# Patient Record
Sex: Male | Born: 1945 | Race: White | Hispanic: No | Marital: Married
Health system: Midwestern US, Community
[De-identification: ages and names within clinical notes are randomized; demographics above are authoritative.]

## PROBLEM LIST (undated history)

## (undated) DIAGNOSIS — E119 Type 2 diabetes mellitus without complications: Secondary | ICD-10-CM

## (undated) DIAGNOSIS — I10 Essential (primary) hypertension: Secondary | ICD-10-CM

## (undated) DIAGNOSIS — I219 Acute myocardial infarction, unspecified: Secondary | ICD-10-CM

## (undated) DIAGNOSIS — I509 Heart failure, unspecified: Secondary | ICD-10-CM

## (undated) HISTORY — PX: AORTIC VALVE REPLACEMENT (AVR)/CORONARY ARTERY BYPASS GRAFTING (CABG): SHX5725

## (undated) HISTORY — PX: VALVE REPLACEMENT: SUR13

## (undated) HISTORY — PX: STENT PLACEMENT VASCULAR (ARMC HX): HXRAD1737

---

## 2016-11-01 LAB — PSA, DIAGNOSTIC (PROSTATE SPECIFIC AG): Prostate Specific Ag: 4.789 ng/mL — AB (ref 0.00–4.00)

## 2017-01-03 ENCOUNTER — Encounter: Attending: Urology | Primary: Internal Medicine

## 2017-01-24 ENCOUNTER — Inpatient Hospital Stay: Admit: 2017-01-24 | Payer: BLUE CROSS/BLUE SHIELD | Primary: Internal Medicine

## 2017-01-24 DIAGNOSIS — I10 Essential (primary) hypertension: Secondary | ICD-10-CM

## 2017-01-24 LAB — METABOLIC PANEL, BASIC
Anion gap: 10 mmol/L (ref 3.0–18)
BUN/Creatinine ratio: 18 (ref 12–20)
BUN: 19 MG/DL — ABNORMAL HIGH (ref 7.0–18)
CO2: 31 mmol/L (ref 21–32)
Calcium: 8.9 MG/DL (ref 8.5–10.1)
Chloride: 102 mmol/L (ref 100–108)
Creatinine: 1.03 MG/DL (ref 0.6–1.3)
GFR est AA: >60 mL/min/{1.73_m2} (ref >60)
GFR est non-AA: >60 mL/min/{1.73_m2} (ref >60)
Glucose: 188 mg/dL — ABNORMAL HIGH (ref 74–99)
Potassium: 3.7 mmol/L (ref 3.5–5.5)
Sodium: 143 mmol/L (ref 136–145)

## 2017-01-24 LAB — HEMOGLOBIN A1C WITH EAG
Est. average glucose: 134 mg/dL
Hemoglobin A1c: 6.3 % — ABNORMAL HIGH (ref 4.2–5.6)

## 2017-02-14 ENCOUNTER — Ambulatory Visit
Admit: 2017-02-14 | Discharge: 2017-02-14 | Payer: PRIVATE HEALTH INSURANCE | Attending: Urology | Primary: Internal Medicine

## 2017-02-14 DIAGNOSIS — R972 Elevated prostate specific antigen [PSA]: Secondary | ICD-10-CM

## 2017-02-14 LAB — AMB POC URINALYSIS DIP STICK AUTO W/O MICRO
Bilirubin (UA POC): NEGATIVE
Blood (UA POC): NEGATIVE
Leukocyte esterase (UA POC): NEGATIVE
Nitrites (UA POC): NEGATIVE
Specific gravity (UA POC): 1.02 (ref 1.001–1.035)
Urobilinogen (UA POC): 0.2 (ref 0.2–1)
pH (UA POC): 6.5 (ref 4.6–8.0)

## 2017-02-14 NOTE — Progress Notes (Signed)
Truman Haywardichard Buccellato presents today for lab draw per Dr. Tilman NeatWalker's order.    Patient will be notified with lab results.    Free and total PSA obtained via venipuncture without any difficulty.      Orders Placed This Encounter   ??? PSA (TOTAL, REFLEX TO FREE)   ??? AMB POC URINALYSIS DIP STICK AUTO W/O MICRO   ??? COLLECTION VENOUS BLOOD,VENIPUNCTURE   ??? amLODIPine (NORVASC) 5 mg tablet   ??? atorvastatin (LIPITOR) 40 mg tablet   ??? metFORMIN (GLUCOPHAGE) 1,000 mg tablet   ??? furosemide (LASIX) 40 mg tablet   ??? losartan-hydroCHLOROthiazide (HYZAAR) 100-12.5 mg per tablet   ??? bisoprolol (ZEBETA) 10 mg tablet   ??? JANUVIA 100 mg tablet   ??? aspirin delayed-release 81 mg tablet     Sig: Take  by mouth daily.   ??? insulin glargine (LANTUS SOLOSTAR U-100 INSULIN) 100 unit/mL (3 mL) inpn     Sig: by SubCUTAneous route.       Haniyyah Sakuma [employer]

## 2017-02-14 NOTE — Progress Notes (Signed)
Encounter Diagnoses     ICD-10-CM ICD-9-CM   1. Elevated PSA R97.20 790.93       ASSESSMENT:     1. Elevated PSA   Most recent PSA 11/01/2016 - 4.789ng/mL   DRE today 02/14/2017 - Prostate 40 grams   No FMHx of prostate cancer     PLAN:    ?? Discussed etiology of elevated PSA in regards to patient's age and prostate cancer.   ?? Repeat PSA free and total, draw today - will inform  ?? Discussed treatment of prostate cancer should his PSA continue to rise  ?? Will follow up pending PSA results    Follow-up Disposition: Not on File    DISCUSSION:    We discussed the use of PSA as a screening test for prostate cancer and the controversy surrounding it. We also discussed PSA age adjusted reference ranges.   PSA Age adjusted reference ranges:  For men aged 62-49, the normal reference range is 0-2.5 ng/mL  For men aged 65-59, the normal reference range is 0-3.5 ng/mL  For men aged 67-69, the normal reference range is 0-4.5 ng/mL  For men aged 63-79, the normal reference range is 0-6.5 ng/mL      Reviewed with the patient was the possible significance of an elevated PSA.  Topics covered, but not limited to, included the variability of the PSA assay as it relates to prostatic size, prostatitis, or laboratory fluctuations.  The possibility that an elevated PSA may be indicative of developing clinically significant prostate cancer was reviewed.  It was emphasized that the PSA alone neither confirms nor refutes the presence or absence of prostatic malignancy.    A brief overview of prostate cancer and therapies for same was reviewed.  Emphasis was placed on early detection of prostate cancer may afford an individual the greatest opportunity to be cured of the prostate cancer.  It was noted that the ability to cure prostate cancer is enhanced by early detection. Curative therapy by surgery such as total prostatectomy was briefly reviewed.Other potentially curative therapies were noted to  include radiation therapy delivered through external beam therapy or transperineal brachytherapy as well as cryotherapy That any of the modalities for curative treatment of prostate cancer may entail significant complications or side effects was noted.  Surgical complications including, but not limited to, cardiovascular events such as MI or pulmonary emboli or bleeding requiring transfusion, wound infection, or reaction to medications or anesthetic drugs was noted.  The possibility that the person could have impaired sexual function or incontinence was noted to be a factor, particularly in regard to surgery, but that it was also possible with radiation therapy.  Thus, it was suggested to the patient that should they wish to know of the development of prostate cancer and would wish to review possible treatment options that they take into account not only the enhanced probability of cure through early detection, but that they be aware of and balance this with the possibility of complications, side effects, or long term alteration of lifestyle that such therapies might entail.        Palliative treatment of prostate malignancy, based on hormonal manipulation, was briefly reviewed and noted to be appropriate in some cases through either medication therapy such as Depo Lupron or Zoladex or through minor surgical treatment such as bilateral orchidectomy.        Consideration as to the patient's age and overall health status was noted to be a factor in a person's deliberation as to  the assessment of the significance of an elevated PSA.  It was noted that a patient with profound medical incapacities such that one's expected longevity might not approach 10 years, should be considered as a factor in the decision as to evaluation and/or treatment of prostate cancer.  The concept of "age adjusted" PSA values was likewise covered.      The technique of ultrasound-guided needle biopsy of the prostate gland was  then reviewed. Precautions to minimize complications such as bleeding or infection were noted. The possibility that such bleeding or infection might occur was noted to be between 1-5%. The possibility of sepsis was also reviewed. The technique of the ultrasound examination of the prostate and subsequent ultrasound-guided needle biopsy the prostate gland was reviewed.      Overall, it was recommended to the patient that they consider an elevated PSA level could be indicative of the presence or development of prostate malignancy.  They were encouraged to give consideration toward their desire to be aware of this as it relates to their then intended desire to be treated.  Long term ill effects or sequelae from treatment were encouraged to be considered in one's overall decision to pursue evaluation and ultimately treatment of prostate malignancy.        It was noted that free and total ratio PSA can be of help in determining the significance of a PSA, particularly between the levels of 4 and 10.  It was noted that this blood test is at times helpful in minimizing potentially unnecessary prostatic imaging and biopsies.  This blood test was offered, though it was noted that it might not be covered by the patient's insurance carrier and that the patient would thus be liable for the cost of this exam.         We discussed the various management options for prostate cancer, including active surveillance, open and robotic radical prostatectomy, EBRT, brachytherapy, proton therapy, and cryotherapy.  We discussed the generally indolent course of many prostate cancers, and the generally favorable oncologic control offered by all treatment strategies.  However, I emphasized that all treatments offer only potential for cure and that in many cases multimodal therapy may ultimately be utilized for long term cancer control.  We also discussed the impact of treatment on sexual and  urinary function.  I emphasized that each treatment has unique quality of life impact and recovery profiles, and we reviewed the possible effects of surgery, radiation, and cryotherapy on quality of life outcomes.  All questions were answered.          Chief Complaint   Patient presents with   ??? Elevated PSA       HISTORY OF PRESENT ILLNESS:  Robert Choi is a 71 y.o. male who is seen in consultation as referred by Dr. Coralyn MarkAmir Hajimomenian, MD for Elevated PSA.     Patient is doing well today  Patient has diabetes, managed with insulin - unsure if his parents also had it  No bone or back pain  No sudden loss of weight or appetite   No f/c/n/v    Patient reports his father recently died at 1389, unrelated to prostate cancer    No FHx of prostate cancer  FHx of liver cancer      REVIEW OF LABS AND IMAGING:      PSA Trend  PSA /TESTOSTERONE - [facility] PSA   Latest Ref Rng & Units 0.00 - 4.00 ng/mL   11/01/2016 4.789 (A)  Results for orders placed or performed in visit on 02/14/17   AMB POC URINALYSIS DIP STICK AUTO W/O MICRO   Result Value Ref Range    Color (UA POC) Yellow     Clarity (UA POC) Clear     Glucose (UA POC) 1+ Negative    Bilirubin (UA POC) Negative Negative    Ketones (UA POC) Trace Negative    Specific gravity (UA POC) 1.020 1.001 - 1.035    Blood (UA POC) Negative Negative    pH (UA POC) 6.5 4.6 - 8.0    Protein (UA POC) 3+ Negative    Urobilinogen (UA POC) 0.2 mg/dL 0.2 - 1    Nitrites (UA POC) Negative Negative    Leukocyte esterase (UA POC) Negative Negative       Lab Results   Component Value Date/Time    Prostate Specific Ag 4.789 (A) 11/01/2016       CT Results:  No results found for this or any previous visit.    [place] Results:  No results found for this or any previous visit.      PHYSICAL EXAMINATION:   Visit Vitals   ??? BP 138/74   ??? Ht 5' 8.5" (1.74 m)   ??? Wt 225 lb (102.1 kg)   ??? BMI 33.71 kg/m2     Constitutional: WDWN, Pleasant and appropriate affect, No acute distress.     CV:  No peripheral swelling noted  Respiratory: No respiratory distress or difficulties  Abdomen:  No abdominal masses or tenderness. No CVA tenderness. No inguinal hernias noted.   GU Male:    [id number] normal to visual inspection, no erythema or irritation, Sphincter with good tone, Rectum with no hemorrhoids, fissures or masses, Prostate smooth, symmetric and anodular. Prostate is 40 grams  SCROTUM:  No scrotal rash or lesions noticed.  Normal bilateral testes and epididymis.   PENIS: Urethral meatus normal in location and size. No urethral discharge.  Skin: No jaundice.    Neuro/Psych:  Alert and oriented x 3, affect appropriate.   Lymphatic:   No enlarged inguinal lymph nodes.       AUA Symptom Score 02/14/2017   Over the past month how often have you had the sensation that your bladder was not completely empty after you finished urinating? 1   Over the past month, how often have had to urinate again less than 2 hours after you last finished urinating? 1   Over the past month, how often have you found you stopped and started again several times when you urinated? 1   Over the past month, how often have you found it difficult to postpone urination? 1   Over the past month, how often have you had a weak urinary stream? 1   Over the past month, how often have you had to push or strain to begin urinating? 1   Over the past month, how many times did you most typically get up to urinate from the time you went to bed at night until the time you got up in the morning? 1   AUA Score 7   If you were to spend the rest of your life with your urinary condition the way it is now, how would you feel about that? Mostly satisfied         No past medical history on file.    No past surgical history on file.    Social History   Substance Use Topics   ??? Smoking status: Never  Smoker   ??? Smokeless tobacco: Never Used   ??? Alcohol use No       No Known Allergies    No family history on file.    Current Outpatient Prescriptions    Medication Sig Dispense Refill   ??? amLODIPine (NORVASC) 5 mg tablet      ??? atorvastatin (LIPITOR) 40 mg tablet      ??? metFORMIN (GLUCOPHAGE) 1,000 mg tablet      ??? furosemide (LASIX) 40 mg tablet      ??? losartan-hydroCHLOROthiazide (HYZAAR) 100-12.5 mg per tablet      ??? bisoprolol (ZEBETA) 10 mg tablet      ??? JANUVIA 100 mg tablet      ??? aspirin delayed-release 81 mg tablet Take  by mouth daily.     ??? insulin glargine (LANTUS SOLOSTAR U-100 INSULIN) 100 unit/mL (3 mL) inpn by SubCUTAneous route.             Review of Systems  Constitutional: Fever: No  Skin: Rash: No  HEENT: Hearing difficulty: No  Eyes: Blurred vision: No  Cardiovascular: Chest pain: No  Respiratory: Shortness of breath: No  Gastrointestinal: Nausea/vomiting: No  Musculoskeletal: Back pain: No  Neurological: Weakness: No  Psychological: Memory loss: No  Comments/additional findings:       A copy of today's office visit with all pertinent imaging results and labs were sent to the referring physician.          Medical documentation provided with the assistance of Burnard Bunting, medical scribe to Arminda Resides, MD on 02/14/2017.

## 2017-02-15 LAB — PSA (TOTAL, REFLEX TO FREE): Prostate Specific Ag: 4.73 ng/mL — ABNORMAL HIGH (ref 0.00–4.00)

## 2017-02-15 LAB — PSA (FREE)
PSA, % Free: 24 %
PSA, Free: 1.15 ng/mL

## 2017-02-15 NOTE — Progress Notes (Signed)
PSA is elevated  Rec consider Bx of prostate as previously discussed or RTC to discuss prn    Robert Choi  W  Kealy Lewter, MD

## 2017-02-16 NOTE — Telephone Encounter (Signed)
I called and spoke with Mr. Robert Choi.  I informed him of Dr. Tilman NeatWalker's recommendations and he does not want to schedule a prostate biopsy at this time. He would like to schedule and appointment with Dr. Dan HumphreysWalker to sit down and discuss his elevated psa in more detail.  An appointment was scheduled.

## 2017-02-16 NOTE — Telephone Encounter (Signed)
-----   Message from Arminda Resides, MD sent at 02/15/2017  6:09 PM EDT -----  PSA is elevated  Rec consider Bx of prostate as previously discussed or RTC to discuss prn    Arminda Resides, MD

## 2017-03-01 NOTE — Telephone Encounter (Signed)
Patient called stated that he does not wish to have a consultation appt with Dr.Walker on 03/28/17 he would like to go head and schedule a biopsy. Please advised and contact the patient regarding this matter at [phone].

## 2017-03-03 NOTE — Telephone Encounter (Signed)
Left message for patient to return call

## 2017-03-07 MED ORDER — LEVOFLOXACIN 750 MG TAB
750 mg | ORAL_TABLET | ORAL | 0 refills | Status: AC
Start: 2017-03-07 — End: ?

## 2017-03-07 NOTE — Telephone Encounter (Signed)
[place]. He can be reached at [phone].

## 2017-03-07 NOTE — Telephone Encounter (Signed)
Spoke with patient and scheduled biopsy for 04/25/17. Instructed to arrive at 10:30am. Instructions reviewed and mailed to patient.  Levaquin 750mg  #1 sent to [facility].

## 2017-03-11 NOTE — Telephone Encounter (Signed)
Patient called and r/s his bx on 04/25/17 with Dr. Dan HumphreysWalker due to insurance benefits changing- he requested appt in January- r/s appt on 05/31/17.  He also canceled 03/28/17 appt stated he no longer need the appt.

## 2017-03-28 ENCOUNTER — Encounter: Attending: Urology | Primary: Internal Medicine

## 2017-04-25 ENCOUNTER — Encounter: Attending: Urology | Primary: Internal Medicine

## 2017-05-26 NOTE — Telephone Encounter (Signed)
I phoned the patient to go over his pre biopsy instructions. LMOM for patient to call the office.

## 2017-05-31 ENCOUNTER — Encounter: Attending: Urology | Primary: Internal Medicine

## 2017-06-24 NOTE — Telephone Encounter (Signed)
Pt cancelled biopsy appt ono 06/30/2017 due to work schedule. He said he will call back to reschedule.

## 2017-06-30 ENCOUNTER — Encounter: Attending: Urology | Primary: Internal Medicine

## 2017-11-03 DIAGNOSIS — Z951 Presence of aortocoronary bypass graft: Secondary | ICD-10-CM

## 2020-03-29 ENCOUNTER — Emergency Department: Payer: BC Managed Care – PPO

## 2020-03-29 ENCOUNTER — Encounter: Payer: Self-pay | Admitting: Emergency Medicine

## 2020-03-29 ENCOUNTER — Observation Stay
Admission: EM | Admit: 2020-03-29 | Discharge: 2020-03-30 | Disposition: A | Discharge status: Home / Self Care | Payer: BC Managed Care – PPO | Attending: Obstetrics and Gynecology | Admitting: Obstetrics and Gynecology

## 2020-03-29 ENCOUNTER — Other Ambulatory Visit: Payer: Self-pay

## 2020-03-29 DIAGNOSIS — I5042 Chronic combined systolic (congestive) and diastolic (congestive) heart failure: Secondary | ICD-10-CM | POA: Insufficient documentation

## 2020-03-29 DIAGNOSIS — I110 Hypertensive heart disease with heart failure: Secondary | ICD-10-CM | POA: Insufficient documentation

## 2020-03-29 DIAGNOSIS — I25810 Atherosclerosis of coronary artery bypass graft(s) without angina pectoris: Secondary | ICD-10-CM | POA: Diagnosis not present

## 2020-03-29 DIAGNOSIS — F32A Depression, unspecified: Secondary | ICD-10-CM | POA: Diagnosis present

## 2020-03-29 DIAGNOSIS — E119 Type 2 diabetes mellitus without complications: Secondary | ICD-10-CM | POA: Diagnosis not present

## 2020-03-29 DIAGNOSIS — J9621 Acute and chronic respiratory failure with hypoxia: Principal | ICD-10-CM | POA: Insufficient documentation

## 2020-03-29 DIAGNOSIS — I10 Essential (primary) hypertension: Secondary | ICD-10-CM | POA: Diagnosis present

## 2020-03-29 DIAGNOSIS — Z20822 Contact with and (suspected) exposure to covid-19: Secondary | ICD-10-CM | POA: Diagnosis not present

## 2020-03-29 DIAGNOSIS — Z79899 Other long term (current) drug therapy: Secondary | ICD-10-CM | POA: Diagnosis not present

## 2020-03-29 DIAGNOSIS — I2510 Atherosclerotic heart disease of native coronary artery without angina pectoris: Secondary | ICD-10-CM | POA: Diagnosis not present

## 2020-03-29 DIAGNOSIS — Z794 Long term (current) use of insulin: Secondary | ICD-10-CM | POA: Insufficient documentation

## 2020-03-29 DIAGNOSIS — R0602 Shortness of breath: Secondary | ICD-10-CM | POA: Diagnosis present

## 2020-03-29 DIAGNOSIS — I509 Heart failure, unspecified: Secondary | ICD-10-CM

## 2020-03-29 DIAGNOSIS — F329 Major depressive disorder, single episode, unspecified: Secondary | ICD-10-CM | POA: Diagnosis not present

## 2020-03-29 DIAGNOSIS — J9601 Acute respiratory failure with hypoxia: Secondary | ICD-10-CM

## 2020-03-29 DIAGNOSIS — I5043 Acute on chronic combined systolic (congestive) and diastolic (congestive) heart failure: Secondary | ICD-10-CM | POA: Diagnosis not present

## 2020-03-29 DIAGNOSIS — J9600 Acute respiratory failure, unspecified whether with hypoxia or hypercapnia: Secondary | ICD-10-CM | POA: Diagnosis present

## 2020-03-29 HISTORY — DX: Essential (primary) hypertension: I10

## 2020-03-29 HISTORY — DX: Acute myocardial infarction, unspecified: I21.9

## 2020-03-29 HISTORY — DX: Type 2 diabetes mellitus without complications: E11.9

## 2020-03-29 HISTORY — DX: Heart failure, unspecified: I50.9

## 2020-03-29 LAB — COMPREHENSIVE METABOLIC PANEL
ALT: 19 U/L (ref 0–44)
AST: 23 U/L (ref 15–41)
Albumin: 3.8 g/dL (ref 3.5–5.0)
Alkaline Phosphatase: 68 U/L (ref 38–126)
Anion gap: 10 (ref 5–15)
BUN: 18 mg/dL (ref 8–23)
CO2: 26 mmol/L (ref 22–32)
Calcium: 9.1 mg/dL (ref 8.9–10.3)
Chloride: 103 mmol/L (ref 98–111)
Creatinine, Ser: 1.03 mg/dL (ref 0.61–1.24)
GFR, Estimated: >60 mL/min (ref >60)
Glucose, Bld: 156 mg/dL — ABNORMAL HIGH (ref 70–99)
Potassium: 4.1 mmol/L (ref 3.5–5.1)
Sodium: 139 mmol/L (ref 135–145)
Total Bilirubin: 0.8 mg/dL (ref 0.3–1.2)
Total Protein: 7.7 g/dL (ref 6.5–8.1)

## 2020-03-29 LAB — CBC WITH DIFFERENTIAL/PLATELET
Abs Immature Granulocytes: 0.04 10*3/uL (ref 0.00–0.07)
Basophils Absolute: 0.1 10*3/uL (ref 0.0–0.1)
Basophils Relative: 1 %
Eosinophils Absolute: 0.4 10*3/uL (ref 0.0–0.5)
Eosinophils Relative: 4 %
HCT: 45.8 % (ref 39.0–52.0)
Hemoglobin: 14.6 g/dL (ref 13.0–17.0)
Immature Granulocytes: 1 %
Lymphocytes Relative: 27 %
Lymphs Abs: 2.4 10*3/uL (ref 0.7–4.0)
MCH: 29.5 pg (ref 26.0–34.0)
MCHC: 31.9 g/dL (ref 30.0–36.0)
MCV: 92.5 fL (ref 80.0–100.0)
Monocytes Absolute: 0.8 10*3/uL (ref 0.1–1.0)
Monocytes Relative: 8 %
Neutro Abs: 5.3 10*3/uL (ref 1.7–7.7)
Neutrophils Relative %: 59 %
Platelets: 260 10*3/uL (ref 150–400)
RBC: 4.95 MIL/uL (ref 4.22–5.81)
RDW: 14.3 % (ref 11.5–15.5)
WBC: 8.9 10*3/uL (ref 4.0–10.5)
nRBC: 0 % (ref 0.0–0.2)

## 2020-03-29 LAB — URINALYSIS, COMPLETE (UACMP) WITH MICROSCOPIC
Bacteria, UA: NONE SEEN
Bilirubin Urine: NEGATIVE
Glucose, UA: NEGATIVE mg/dL
Hgb urine dipstick: NEGATIVE
Ketones, ur: NEGATIVE mg/dL
Leukocytes,Ua: NEGATIVE
Nitrite: NEGATIVE
Protein, ur: 100 mg/dL — AB
Specific Gravity, Urine: 1.011 (ref 1.005–1.030)
Squamous Epithelial / HPF: NONE SEEN (ref 0–5)
pH: 5 (ref 5.0–8.0)

## 2020-03-29 LAB — RESPIRATORY PANEL BY RT PCR (FLU A&B, COVID)
Influenza A by PCR: NEGATIVE
Influenza B by PCR: NEGATIVE
SARS Coronavirus 2 by RT PCR: NEGATIVE

## 2020-03-29 LAB — CBG MONITORING, ED: Glucose-Capillary: 102 mg/dL — ABNORMAL HIGH (ref 70–99)

## 2020-03-29 LAB — APTT: aPTT: 29 seconds (ref 24–36)

## 2020-03-29 LAB — GLUCOSE, CAPILLARY
Glucose-Capillary: 95 mg/dL (ref 70–99)
Glucose-Capillary: 108 mg/dL — ABNORMAL HIGH (ref 70–99)

## 2020-03-29 LAB — PROTIME-INR
INR: 0.9 (ref 0.8–1.2)
Prothrombin Time: 11.9 seconds (ref 11.4–15.2)

## 2020-03-29 LAB — BRAIN NATRIURETIC PEPTIDE: B Natriuretic Peptide: 1604 pg/mL — ABNORMAL HIGH (ref 0.0–100.0)

## 2020-03-29 LAB — TROPONIN I (HIGH SENSITIVITY): Troponin I (High Sensitivity): 14 ng/L (ref <18)

## 2020-03-29 MED ORDER — SODIUM CHLORIDE 0.9% FLUSH: 3.0000 mL | INTRAVENOUS | Status: DC | PRN

## 2020-03-29 MED ORDER — FUROSEMIDE 10 MG/ML IJ SOLN
40.0000 mg | Freq: Two times a day (BID) | INTRAMUSCULAR | Status: DC
  Administered 2020-03-29: 40 mg via INTRAVENOUS
  Filled 2020-03-29: qty 4

## 2020-03-29 MED ORDER — INSULIN ASPART 100 UNIT/ML ~~LOC~~ SOLN
0.0000 [IU] | Freq: Three times a day (TID) | SUBCUTANEOUS | Status: DC
  Administered 2020-03-30 (×3): 4 [IU] via SUBCUTANEOUS
  Filled 2020-03-29 (×3): qty 1

## 2020-03-29 MED ORDER — ATORVASTATIN CALCIUM 80 MG PO TABS
80.0000 mg | ORAL_TABLET | Freq: Every day | ORAL | Status: DC
  Administered 2020-03-29: 80 mg via ORAL
  Filled 2020-03-29: qty 1

## 2020-03-29 MED ORDER — FLUOXETINE HCL 20 MG PO CAPS
30.0000 mg | ORAL_CAPSULE | Freq: Every day | ORAL | Status: DC
  Administered 2020-03-29 – 2020-03-30 (×2): 30 mg via ORAL
  Filled 2020-03-29 (×2): qty 1

## 2020-03-29 MED ORDER — SACUBITRIL-VALSARTAN 49-51 MG PO TABS
1.0000 | ORAL_TABLET | Freq: Two times a day (BID) | ORAL | Status: DC
  Administered 2020-03-29 – 2020-03-30 (×3): 1 via ORAL
  Filled 2020-03-29 (×4): qty 1

## 2020-03-29 MED ORDER — INFLUENZA VAC A&B SA ADJ QUAD 0.5 ML IM PRSY
0.5000 mL | PREFILLED_SYRINGE | INTRAMUSCULAR | Status: DC
  Filled 2020-03-29: qty 0.5

## 2020-03-29 MED ORDER — SPIRONOLACTONE 25 MG PO TABS
25.0000 mg | ORAL_TABLET | Freq: Every day | ORAL | Status: DC
  Administered 2020-03-29 – 2020-03-30 (×2): 25 mg via ORAL
  Filled 2020-03-29 (×2): qty 1

## 2020-03-29 MED ORDER — CLOPIDOGREL BISULFATE 75 MG PO TABS
75.0000 mg | ORAL_TABLET | Freq: Every evening | ORAL | Status: DC
  Administered 2020-03-29 – 2020-03-30 (×2): 75 mg via ORAL
  Filled 2020-03-29 (×2): qty 1

## 2020-03-29 MED ORDER — SODIUM CHLORIDE 0.9 % IV SOLN: 250.0000 mL | INTRAVENOUS | Status: DC | PRN

## 2020-03-29 MED ORDER — ASCORBIC ACID 500 MG PO TABS
500.0000 mg | ORAL_TABLET | Freq: Every day | ORAL | Status: DC
  Administered 2020-03-29 – 2020-03-30 (×2): 500 mg via ORAL
  Filled 2020-03-29 (×2): qty 1

## 2020-03-29 MED ORDER — FUROSEMIDE 10 MG/ML IJ SOLN
60.0000 mg | Freq: Once | INTRAMUSCULAR | Status: AC
  Administered 2020-03-29: 60 mg via INTRAVENOUS
  Filled 2020-03-29: qty 8

## 2020-03-29 MED ORDER — ENOXAPARIN SODIUM 80 MG/0.8ML ~~LOC~~ SOLN
65.0000 mg | SUBCUTANEOUS | Status: DC
  Administered 2020-03-29: 65 mg via SUBCUTANEOUS
  Filled 2020-03-29: qty 0.8

## 2020-03-29 MED ORDER — OMEGA-3-ACID ETHYL ESTERS 1 G PO CAPS
1000.0000 mg | ORAL_CAPSULE | ORAL | Status: DC
  Administered 2020-03-29: 1000 mg via ORAL
  Filled 2020-03-29: qty 1

## 2020-03-29 MED ORDER — ADULT MULTIVITAMIN W/MINERALS CH
1.0000 | ORAL_TABLET | Freq: Every day | ORAL | Status: DC
  Administered 2020-03-29 – 2020-03-30 (×2): 1 via ORAL
  Filled 2020-03-29 (×2): qty 1

## 2020-03-29 MED ORDER — NITROGLYCERIN 0.4 MG SL SUBL
SUBLINGUAL_TABLET | SUBLINGUAL | Status: AC
  Filled 2020-03-29: qty 1

## 2020-03-29 MED ORDER — ACETAMINOPHEN 325 MG PO TABS: 650.0000 mg | ORAL_TABLET | ORAL | Status: DC | PRN

## 2020-03-29 MED ORDER — CARVEDILOL 12.5 MG PO TABS
12.5000 mg | ORAL_TABLET | Freq: Two times a day (BID) | ORAL | Status: DC
  Filled 2020-03-29 (×3): qty 1

## 2020-03-29 MED ORDER — SODIUM CHLORIDE 0.9% FLUSH
3.0000 mL | Freq: Two times a day (BID) | INTRAVENOUS | Status: DC
  Administered 2020-03-29 – 2020-03-30 (×2): 3 mL via INTRAVENOUS

## 2020-03-29 MED ORDER — NITROGLYCERIN 2 % TD OINT: 1.0000 [in_us] | TOPICAL_OINTMENT | Freq: Once | TRANSDERMAL | Status: DC

## 2020-03-29 MED ORDER — NITROGLYCERIN 0.4 MG SL SUBL
0.4000 mg | SUBLINGUAL_TABLET | SUBLINGUAL | Status: DC | PRN
  Administered 2020-03-29: 0.4 mg via SUBLINGUAL

## 2020-03-29 MED ORDER — RENA-VITE PO TABS
1.0000 | ORAL_TABLET | Freq: Every evening | ORAL | Status: DC
  Filled 2020-03-29 (×3): qty 1

## 2020-03-29 MED ORDER — ONDANSETRON HCL 4 MG/2ML IJ SOLN: 4.0000 mg | Freq: Four times a day (QID) | INTRAMUSCULAR | Status: DC | PRN

## 2020-03-29 NOTE — ED Notes (Signed)
RT decreased O2 levels to 5L . Pt 100%

## 2020-03-29 NOTE — ED Triage Notes (Addendum)
Pt via EMS from home for Respiratory Distress. Per pt, he has been increasingly SOB today. EMS reports mid-80s and placed pt on 6L. On arrival, pt on 6L sat 87%, placed on NRB 15L and O2 sat increased to 95%. No home O2 at baseline. Dr. Roxan Hockey at bedside. RT called. Pt labored and accessory muscle use on arrival. Pt is A&Ox4. Hx of CHF but has been taking lasix normally.

## 2020-03-29 NOTE — ED Notes (Signed)
RT at bedside to place pt on Bipap. O2 sat on Bipap 97%

## 2020-03-29 NOTE — ED Notes (Signed)
Pt WOB has decreased and pt states he feels a lot better. Urinal at bedside.

## 2020-03-29 NOTE — Consult Note (Signed)
Adam Ferguson is a 74 y.o. 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   Review of Systems: No chest pain   Past Medical History:  Diagnosis Date  . CHF (congestive heart failure) (HCC)   . Diabetes mellitus without complication (HCC)   . Hypertension   . MI (myocardial infarction) (HCC)     (Not in a hospital admission)    . ascorbic acid  500 mg Oral Daily  . atorvastatin  80 mg Oral QHS  . carvedilol  12.5 mg Oral BID WC  . clopidogrel  75 mg Oral QPM  . enoxaparin (LOVENOX) injection  65 mg Subcutaneous Q24H  . FLUoxetine  30 mg Oral Daily  . furosemide  40 mg Intravenous BID  . insulin aspart  0-20 Units Subcutaneous TID WC  . multivitamin  1 tablet Oral QPM  . multivitamin with minerals  1 tablet Oral Daily  . omega-3 acid ethyl esters  1,000 mg Oral Q3 days  . sacubitril-valsartan  1 tablet Oral BID  . sodium chloride flush  3 mL Intravenous Q12H    Infusions: . sodium chloride      No Known Allergies  Social History   Socioeconomic History  . Marital status: Married    Spouse name: Not on file  . Number of children: Not on file  . Years of education: Not on file  . Highest education level: Not on file  Occupational History  . Not on file  Tobacco Use  . Smoking status: Never Smoker  . Smokeless tobacco: Never Used  Vaping Use  . Vaping Use: Never used  Substance and Sexual Activity  . Alcohol use: Not Currently  . Drug use: Not Currently  . Sexual activity: Not Currently  Other Topics Concern  . Not on file  Social History Narrative  . Not on file   Social Determinants of Health   Financial  Resource Strain:   . Difficulty of Paying Living Expenses: Not on file  Food Insecurity:   . Worried About Programme researcher, broadcasting/film/video in the Last Year: Not on file  . Ran Out of Food in the Last Year: Not on file  Transportation Needs:   . Lack of Transportation (Medical): Not on file  . Lack of Transportation (Non-Medical): Not on file  Physical Activity:   . Days of Exercise per Week: Not on file  . Minutes of Exercise per Session: Not on file  Stress:   . Feeling of Stress : Not on file  Social Connections:   . Frequency of Communication with Friends and Family: Not on file  . Frequency of Social Gatherings with Friends and Family: Not on file  . Attends Religious Services: Not on file  . Active Member of Clubs or Organizations: Not on file  . Attends Banker Meetings: Not on file  . Marital Status: Not on file  Intimate Partner Violence:   . Fear of Current or Ex-Partner: Not on file  . Emotionally Abused: Not on file  . Physically Abused: Not on file  . Sexually Abused: Not on file    History reviewed. No pertinent family history.  PHYSICAL EXAM: Vitals:   03/29/20  1245 03/29/20 1330  BP: 127/71 122/63  Pulse: (!) 50 (!) 51  Resp: 15 10  Temp:    SpO2: 100% 100%     Intake/Output Summary (Last 24 hours) at 03/29/2020 1459 Last data filed at 03/29/2020 1125 Gross per 24 hour  Intake --  Output 450 ml  Net -450 ml    General:  Well appearing. No respiratory difficulty HEENT: normal Neck: supple. no JVD. Carotids 2+ bilat; no bruits. No lymphadenopathy or thryomegaly appreciated. Cor: PMI nondisplaced. Regular rate & rhythm. No rubs, gallops or murmurs. Lungs: clear Abdomen: soft, nontender, nondistended. No hepatosplenomegaly. No bruits or masses. Good bowel sounds. Extremities: no cyanosis, clubbing, rash, edema Neuro: alert & oriented x 3, cranial nerves grossly intact. moves all 4 extremities w/o difficulty. Affect pleasant.  ECG: Normal sinus  rhythm with nonspecific ST-T changes  Results for orders placed or performed during the hospital encounter of 03/29/20 (from the past 24 hour(s))  Comprehensive metabolic panel     Status: Abnormal   Collection Time: 03/29/20  8:47 AM  Result Value Ref Range   Sodium 139 135 - 145 mmol/L   Potassium 4.1 3.5 - 5.1 mmol/L   Chloride 103 98 - 111 mmol/L   CO2 26 22 - 32 mmol/L   Glucose, Bld 156 (H) 70 - 99 mg/dL   BUN 18 8 - 23 mg/dL   Creatinine, Ser 7.09 0.61 - 1.24 mg/dL   Calcium 9.1 8.9 - 62.8 mg/dL   Total Protein 7.7 6.5 - 8.1 g/dL   Albumin 3.8 3.5 - 5.0 g/dL   AST 23 15 - 41 U/L   ALT 19 0 - 44 U/L   Alkaline Phosphatase 68 38 - 126 U/L   Total Bilirubin 0.8 0.3 - 1.2 mg/dL   GFR, Estimated >36 >62 mL/min   Anion gap 10 5 - 15  CBC WITH DIFFERENTIAL     Status: None   Collection Time: 03/29/20  8:47 AM  Result Value Ref Range   WBC 8.9 4.0 - 10.5 K/uL   RBC 4.95 4.22 - 5.81 MIL/uL   Hemoglobin 14.6 13.0 - 17.0 g/dL   HCT 94.7 39 - 52 %   MCV 92.5 80.0 - 100.0 fL   MCH 29.5 26.0 - 34.0 pg   MCHC 31.9 30.0 - 36.0 g/dL   RDW 65.4 65.0 - 35.4 %   Platelets 260 150 - 400 K/uL   nRBC 0.0 0.0 - 0.2 %   Neutrophils Relative % 59 %   Neutro Abs 5.3 1.7 - 7.7 K/uL   Lymphocytes Relative 27 %   Lymphs Abs 2.4 0.7 - 4.0 K/uL   Monocytes Relative 8 %   Monocytes Absolute 0.8 0.1 - 1.0 K/uL   Eosinophils Relative 4 %   Eosinophils Absolute 0.4 0.0 - 0.5 K/uL   Basophils Relative 1 %   Basophils Absolute 0.1 0.0 - 0.1 K/uL   Immature Granulocytes 1 %   Abs Immature Granulocytes 0.04 0.00 - 0.07 K/uL  Protime-INR     Status: None   Collection Time: 03/29/20  8:47 AM  Result Value Ref Range   Prothrombin Time 11.9 11.4 - 15.2 seconds   INR 0.9 0.8 - 1.2  APTT     Status: None   Collection Time: 03/29/20  8:47 AM  Result Value Ref Range   aPTT 29 24 - 36 seconds  Brain natriuretic peptide     Status: Abnormal   Collection Time: 03/29/20  8:47 AM  Result  Value Ref Range    B Natriuretic Peptide 1,604.0 (H) 0.0 - 100.0 pg/mL  Respiratory Panel by RT PCR (Flu A&B, Covid) - Nasopharyngeal Swab     Status: None   Collection Time: 03/29/20  8:47 AM   Specimen: Nasopharyngeal Swab  Result Value Ref Range   SARS Coronavirus 2 by RT PCR NEGATIVE NEGATIVE   Influenza A by PCR NEGATIVE NEGATIVE   Influenza B by PCR NEGATIVE NEGATIVE  Troponin I (High Sensitivity)     Status: None   Collection Time: 03/29/20  8:47 AM  Result Value Ref Range   Troponin I (High Sensitivity) 14 <18 ng/L  Urinalysis, Complete w Microscopic Urine, Clean Catch     Status: Abnormal   Collection Time: 03/29/20 11:21 AM  Result Value Ref Range   Color, Urine YELLOW (A) YELLOW   APPearance HAZY (A) CLEAR   Specific Gravity, Urine 1.011 1.005 - 1.030   pH 5.0 5.0 - 8.0   Glucose, UA NEGATIVE NEGATIVE mg/dL   Hgb urine dipstick NEGATIVE NEGATIVE   Bilirubin Urine NEGATIVE NEGATIVE   Ketones, ur NEGATIVE NEGATIVE mg/dL   Protein, ur 308 (A) NEGATIVE mg/dL   Nitrite NEGATIVE NEGATIVE   Leukocytes,Ua NEGATIVE NEGATIVE   RBC / HPF 0-5 0 - 5 RBC/hpf   WBC, UA 0-5 0 - 5 WBC/hpf   Bacteria, UA NONE SEEN NONE SEEN   Squamous Epithelial / LPF NONE SEEN 0 - 5   Mucus PRESENT    Hyaline Casts, UA PRESENT   CBG monitoring, ED     Status: Abnormal   Collection Time: 03/29/20 12:11 PM  Result Value Ref Range   Glucose-Capillary 102 (H) 70 - 99 mg/dL   DG Chest Portable 1 View  Result Date: 03/29/2020 CLINICAL DATA:  Respiratory distress EXAM: PORTABLE CHEST 1 VIEW COMPARISON:  None. FINDINGS: There is cardiomegaly with pulmonary venous hypertension. There is interstitial edema with small left pleural effusion. No consolidation. Status post median sternotomy. No adenopathy. No bone lesions. IMPRESSION: Cardiomegaly with pulmonary vascular congestion. Interstitial edema and small left pleural effusion. Overall appearance indicative of a degree of congestive heart failure. Electronically Signed    By: Bretta Bang III M.D.   On: 03/29/2020 09:06     ASSESSMENT AND PLAN: Congestive heart failure with history of HFrEF and myocardial infarction and CABG along with aortic valve replacement presented to the hospital with worsening or decompensation of his congestive heart failure.  Will get echocardiogram and will adjust medications so far patient has BNP which is 1600 but troponins are negative.  Dniyah Grant A

## 2020-03-29 NOTE — ED Provider Notes (Signed)
[facility] Emergency Department Provider Note    First MD Initiated Contact with Patient 03/29/20 [id number]     (approximate)  I have reviewed the triage vital signs and the nursing notes.   HISTORY  Chief Complaint Respiratory Distress  History limited 2/2 resp distress  HPI Adam Ferguson is a 74 y.o. male with the below listed past medical history presents to the ER for evaluation of shortness of breath.  Feels like he is been getting worsening shortness of breath orthopnea exertional dyspnea over the past several days but it became acutely worse this morning.  Does have a history of CHF and has been taking his fluid pills.  Patient arrives to the ER in respiratory distress hypoxic on nonrebreather.    Past Medical History:  Diagnosis Date  . CHF (congestive heart failure) (HCC)   . Diabetes mellitus without complication (HCC)   . Hypertension   . MI (myocardial infarction) (HCC)    History reviewed. No pertinent family history.  Patient Active Problem List   Diagnosis Date Noted  . CHF (congestive heart failure) (HCC) 03/29/2020  . Acute on chronic combined systolic and diastolic CHF (congestive heart failure) (HCC) 03/29/2020  . Depression 03/29/2020  . Diabetes mellitus without complication (HCC)   . Hypertension   . Acute respiratory failure (HCC)   . CAD (coronary artery disease)   . Obesity, Class III, BMI 40-49.9 (morbid obesity) (HCC)       Prior to Admission medications   Medication Sig Start Date End Date Taking? Authorizing Provider  ascorbic acid (VITAMIN C) 500 MG tablet Take 500 mg by mouth daily.   Yes [provider]  atorvastatin (LIPITOR) 80 MG tablet Take 80 mg by mouth at bedtime.   Yes [provider]  B Complex-C-Folic Acid (RENAL) 1 MG CAPS Take 1 capsule by mouth every evening.   Yes [provider]  carvedilol (COREG) 12.5 MG tablet Take 12.5 mg by mouth in the morning and at bedtime.   Yes  [provider]  clopidogrel (PLAVIX) 75 MG tablet Take 75 mg by mouth every evening.   Yes [provider]  empagliflozin (JARDIANCE) 10 MG TABS tablet Take 10 mg by mouth daily.   Yes [provider]  FLUoxetine (PROZAC) 10 MG capsule Take 30 mg by mouth daily.   Yes [provider]  furosemide (LASIX) 40 MG tablet Take 20-40 mg by mouth See admin instructions. Take 1 tablet (40mg ) by mouth every morning and take  tablet (20mg ) by mouth every afternoon   Yes [provider]  insulin glargine (LANTUS) 100 UNIT/ML Solostar Pen Inject 40 Units into the skin at bedtime.   Yes [provider]  metFORMIN (GLUCOPHAGE) 1000 MG tablet Take 2,000 mg by mouth daily.   Yes [provider]  Multiple Vitamins-Minerals (MULTIVITAMIN ADULTS 50+) TABS Take 1 tablet by mouth daily.   Yes [provider]  Omega-3 1000 MG CAPS Take 1,000 mg by mouth every 3 (three) days.   Yes [provider]  sacubitril-valsartan (ENTRESTO) 49-51 MG Take 1 tablet by mouth in the morning and at bedtime.   Yes [provider]    Allergies Patient has no known allergies.    Social History Social History   Tobacco Use  . Smoking status: Never Smoker  . Smokeless tobacco: Never Used  Vaping Use  . Vaping Use: Never used  Substance Use Topics  . Alcohol use: Not Currently  . Drug use:  Not Currently    Review of Systems Patient denies headaches, rhinorrhea, blurry vision, numbness, shortness of breath, chest pain, edema, cough, abdominal pain, nausea, vomiting, diarrhea, dysuria, fevers, rashes or hallucinations unless otherwise stated above in HPI. ____________________________________________   PHYSICAL EXAM:  VITAL SIGNS: Vitals:   03/29/20 1245 03/29/20 1330  BP: 127/71 122/63  Pulse: (!) 50 (!) 51  Resp: 15 10  Temp:    SpO2: 100% 100%    Constitutional: Alert and oriented, in mod respiratory distress Eyes:  Conjunctivae are normal.  Head: Atraumatic. Nose: No congestion/rhinnorhea. Mouth/Throat: Mucous membranes are moist.   Neck: No stridor. Painless ROM.  Cardiovascular: Normal rate, regular rhythm. Grossly normal heart sounds.  Good peripheral circulation. Respiratory: tachypnea with diffuse inspiratory crackles and use of accessory muscles Gastrointestinal: Soft and nontender. No distention. No abdominal bruits. No CVA tenderness. Genitourinary: deferred Musculoskeletal: No lower extremity tenderness nor edema.  No joint effusions. Neurologic:  Normal speech and language. No gross focal neurologic deficits are appreciated. No facial droop Skin:  Skin is cool diaphoretic. No rash noted. Psychiatric: anxious appearing ____________________________________________   LABS (all labs ordered are listed, but only abnormal results are displayed)  Results for orders placed or performed during the hospital encounter of 03/29/20 (from the past 24 hour(s))  Comprehensive metabolic panel     Status: Abnormal   Collection Time: 03/29/20  8:47 AM  Result Value Ref Range   Sodium 139 135 - 145 mmol/L   Potassium 4.1 3.5 - 5.1 mmol/L   Chloride 103 98 - 111 mmol/L   CO2 26 22 - 32 mmol/L   Glucose, Bld 156 (H) 70 - 99 mg/dL   BUN 18 8 - 23 mg/dL   Creatinine, Ser 8.41 0.61 - 1.24 mg/dL   Calcium 9.1 8.9 - 32.4 mg/dL   Total Protein 7.7 6.5 - 8.1 g/dL   Albumin 3.8 3.5 - 5.0 g/dL   AST 23 15 - 41 U/L   ALT 19 0 - 44 U/L   Alkaline Phosphatase 68 38 - 126 U/L   Total Bilirubin 0.8 0.3 - 1.2 mg/dL   GFR, Estimated >40 >10 mL/min   Anion gap 10 5 - 15  CBC WITH DIFFERENTIAL     Status: None   Collection Time: 03/29/20  8:47 AM  Result Value Ref Range   WBC 8.9 4.0 - 10.5 K/uL   RBC 4.95 4.22 - 5.81 MIL/uL   Hemoglobin 14.6 13.0 - 17.0 g/dL   HCT 27.2 39 - 52 %   MCV 92.5 80.0 - 100.0 fL   MCH 29.5 26.0 - 34.0 pg   MCHC 31.9 30.0 - 36.0 g/dL   RDW 53.6 64.4 - 03.4 %   Platelets 260 150 -  400 K/uL   nRBC 0.0 0.0 - 0.2 %   Neutrophils Relative % 59 %   Neutro Abs 5.3 1.7 - 7.7 K/uL   Lymphocytes Relative 27 %   Lymphs Abs 2.4 0.7 - 4.0 K/uL   Monocytes Relative 8 %   Monocytes Absolute 0.8 0.1 - 1.0 K/uL   Eosinophils Relative 4 %   Eosinophils Absolute 0.4 0.0 - 0.5 K/uL   Basophils Relative 1 %   Basophils Absolute 0.1 0.0 - 0.1 K/uL   Immature Granulocytes 1 %   Abs Immature Granulocytes 0.04 0.00 - 0.07 K/uL  Protime-INR     Status: None   Collection Time: 03/29/20  8:47 AM  Result Value Ref Range   Prothrombin Time 11.9 11.4 -  15.2 seconds   INR 0.9 0.8 - 1.2  APTT     Status: None   Collection Time: 03/29/20  8:47 AM  Result Value Ref Range   aPTT 29 24 - 36 seconds  Brain natriuretic peptide     Status: Abnormal   Collection Time: 03/29/20  8:47 AM  Result Value Ref Range   B Natriuretic Peptide 1,604.0 (H) 0.0 - 100.0 pg/mL  Respiratory Panel by RT PCR (Flu A&B, Covid) - Nasopharyngeal Swab     Status: None   Collection Time: 03/29/20  8:47 AM   Specimen: Nasopharyngeal Swab  Result Value Ref Range   SARS Coronavirus 2 by RT PCR NEGATIVE NEGATIVE   Influenza A by PCR NEGATIVE NEGATIVE   Influenza B by PCR NEGATIVE NEGATIVE  Troponin I (High Sensitivity)     Status: None   Collection Time: 03/29/20  8:47 AM  Result Value Ref Range   Troponin I (High Sensitivity) 14 <18 ng/L  Urinalysis, Complete w Microscopic Urine, Clean Catch     Status: Abnormal   Collection Time: 03/29/20 11:21 AM  Result Value Ref Range   Color, Urine YELLOW (A) YELLOW   APPearance HAZY (A) CLEAR   Specific Gravity, Urine 1.011 1.005 - 1.030   pH 5.0 5.0 - 8.0   Glucose, UA NEGATIVE NEGATIVE mg/dL   Hgb urine dipstick NEGATIVE NEGATIVE   Bilirubin Urine NEGATIVE NEGATIVE   Ketones, ur NEGATIVE NEGATIVE mg/dL   Protein, ur 462 (A) NEGATIVE mg/dL   Nitrite NEGATIVE NEGATIVE   Leukocytes,Ua NEGATIVE NEGATIVE   RBC / HPF 0-5 0 - 5 RBC/hpf   WBC, UA 0-5 0 - 5 WBC/hpf    Bacteria, UA NONE SEEN NONE SEEN   Squamous Epithelial / LPF NONE SEEN 0 - 5   Mucus PRESENT    Hyaline Casts, UA PRESENT   CBG monitoring, ED     Status: Abnormal   Collection Time: 03/29/20 12:11 PM  Result Value Ref Range   Glucose-Capillary 102 (H) 70 - 99 mg/dL   ____________________________________________  EKG My review and personal interpretation at Time: 8:53   Indication: sob  Rate: 90  Rhythm: sinus Axis: normal Other: normal intervals, no stemi, nonspecific st abn ____________________________________________  RADIOLOGY  I personally reviewed all radiographic images ordered to evaluate for the above acute complaints and reviewed radiology reports and findings.  These findings were personally discussed with the patient.  Please see medical record for radiology report.  ____________________________________________   PROCEDURES  Procedure(s) performed:  .Critical Care Performed by: Willy Eddy, MD Authorized by: Willy Eddy, MD   Critical care provider statement:    Critical care time (minutes):  35   Critical care time was exclusive of:  Separately billable procedures and treating other patients   Critical care was necessary to treat or prevent imminent or life-threatening deterioration of the following conditions:  Respiratory failure   Critical care was time spent personally by me on the following activities:  Development of treatment plan with patient or surrogate, discussions with consultants, evaluation of patient's response to treatment, examination of patient, obtaining history from patient or surrogate, ordering and performing treatments and interventions, ordering and review of laboratory studies, ordering and review of radiographic studies, pulse oximetry, re-evaluation of patient's condition and review of old charts      Critical Care performed: yes ____________________________________________   INITIAL IMPRESSION / ASSESSMENT AND PLAN / ED  COURSE  Pertinent labs & imaging results that were available during my care  of the patient were reviewed by me and considered in my medical decision making (see chart for details).   DDX: Asthma, copd, CHF, pna, ptx, malignancy, Pe, anemia   Adam Ferguson is a 74 y.o. who presents to the ED with history of CHF diabetes arrives to the ER with evidence of acute hypoxic respiratory distress. Still hypoxic on nonrebreather will place on BiPAP. Chest x-ray concerning for interstitial edema. Exam is consistent with volume overload and CHF. Is hypertensive. Denying any chest pain but is diaphoretic. EKG without STEMI criteria.  The patient will be placed on continuous pulse oximetry and telemetry for monitoring.  Laboratory evaluation will be sent to evaluate for the above complaints.     Clinical Course as of Mar 29 1514  Sat Mar 29, 2020  1014 Patient significantly improved on BiPAP.  Appears much more comfortable.  No longer diaphoretic.   [PR]    Clinical Course User Index [PR] Willy Eddy, MD   Case discussed with hospitalist who agrees to admit patient for further medical management.  The patient was evaluated in Emergency Department today for the symptoms described in the history of present illness. He/she was evaluated in the context of the global COVID-19 pandemic, which necessitated consideration that the patient might be at risk for infection with the SARS-CoV-2 virus that causes COVID-19. Institutional protocols and algorithms that pertain to the evaluation of patients at risk for COVID-19 are in a state of rapid change based on information released by regulatory bodies including the CDC and federal and state organizations. These policies and algorithms were followed during the patient's care in the ED.  As part of my medical decision making, I reviewed the following data within the electronic MEDICAL RECORD NUMBER Nursing notes reviewed and incorporated, Labs reviewed, notes from prior  ED visits and [place] Controlled Substance Database   ____________________________________________   FINAL CLINICAL IMPRESSION(S) / ED DIAGNOSES  Final diagnoses:  Acute respiratory failure with hypoxia (HCC)  Acute on chronic congestive heart failure, unspecified heart failure type (HCC)      NEW MEDICATIONS STARTED DURING THIS VISIT:  New Prescriptions   No medications on file     Note:  This document was prepared using Dragon voice recognition software and may include unintentional dictation errors.    Willy Eddy, MD 03/29/20 1515

## 2020-03-29 NOTE — ED Notes (Signed)
Pt extremely diaphoretic on arrival, IVs wrapped in [place] at this time.

## 2020-03-29 NOTE — ED Notes (Signed)
Messaged Dr. Joylene Igo, MD regarding pt's HR. Instructed to hold any beta blockers at this time.

## 2020-03-29 NOTE — Plan of Care (Signed)

## 2020-03-29 NOTE — ED Notes (Signed)
Hospitalist at bedside at this time 

## 2020-03-29 NOTE — ED Notes (Signed)
Pt requesting to be taken off the BiPap because he was hungry. Told pt we would take him off and trial him on [place] and make sure his O2 doesn't drop. Pt placed on 6L [place] and O2 100% at this time.

## 2020-03-29 NOTE — H&P (Signed)
History and Physical    Adam Ferguson LTJ:[id number] DOB: 01-05-1946 DOA: 03/29/2020  PCP: Patient, No Pcp Per   Patient coming from: Home  I have personally briefly reviewed patient's old medical records in [facility]  Chief Complaint: Shortness of breath  HPI: Adam Ferguson is a 74 y.o. male with medical history significant for chronic systolic heart failure with last known LVEF of 45%, history of hypertension, diabetes mellitus and coronary artery disease status post CABG who was brought into the ER by EMS for evaluation of respiratory distress.  Patient states that he has had progressively worsening shortness of breath over the last couple of days but it got worse on the day of admission.  Shortness of breath is associated with lower extremity swelling and orthopnea but he denies having any chest pain.  Per EMS his room air pulse oximetry was in the mid 80s upon arrival and he was placed on 6 L of oxygen.  When patient arrived to the ER he had pulse oximetry of 87% on 6 L and was then placed on a nonrebreather mask at 15 L with improvement in his pulse oximetry to 95%.  Due to accessory muscle use and increased work of breathing patient was then placed on a BiPAP. Patient states that he has had a 10 pound weight gain over the last 1 year and admits to dietary indiscretion He denies having any nausea, no vomiting, no cough, no fever, no chills no abdominal pain, no changes in his bowel habits, no urinary symptoms, no dizziness or lightheadedness. Labs show sodium 139, potassium 4.1, chloride 103, bicarb 26, BUN 18, creatinine 1.03, calcium 9.9, alkaline phosphatase 68, albumin 3.8, AST 23, ALT 19, BMP 05/22/2002, white count 8.9, hemoglobin 14.6, hematocrit 45.8, MCV 92.5, RDW 14.3, platelet count 260 Respiratory viral panel is negative Chest x-ray reviewed by me showed cardiomegaly with pulmonary vascular congestion. Twelve-lead EKG reviewed by me shows sinus rhythm    ED Course:  Patient is a 74 year old male with a history of chronic systolic heart failure who was brought into the ER for evaluation of respiratory distress and found to be in CHF. His blood pressure was significantly elevated with SBP of .  He was placed on BiPAP in the ER due to increased work of breathing and use of accessory muscles.  He received a dose of Lasix in the ER.  He will be admitted to the hospital for further evaluation.  Review of Systems: As per HPI otherwise 10 point review of systems negative.    Past Medical History:  Diagnosis Date  . CHF (congestive heart failure) (HCC)   . Diabetes mellitus without complication (HCC)   . Hypertension   . MI (myocardial infarction) [facility])     Past Surgical History:  Procedure Laterality Date  . 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 STENT PLACEMENT VASCULAR ([facility] HX)    . VALVE REPLACEMENT       reports that he has never smoked. He has never used smokeless tobacco. He reports previous alcohol use. He reports previous drug use.  No Known Allergies  History reviewed. No pertinent family history.   Prior to Admission medications   Medication Sig Start Date End Date Taking? Authorizing Provider  ascorbic acid (VITAMIN C) 500 MG tablet Take 500 mg by mouth daily.   Yes [provider]  atorvastatin (LIPITOR) 80 MG tablet Take 80 mg by mouth at bedtime.   Yes [provider]  B Complex-C-Folic Acid (RENAL) 1 MG CAPS Take 1 capsule by mouth every evening.   Yes [provider]  carvedilol (COREG) 12.5 MG tablet Take 12.5 mg by mouth in the morning and at bedtime.   Yes [provider]  clopidogrel (PLAVIX) 75 MG tablet Take 75 mg by mouth every evening.   Yes [provider]  empagliflozin (JARDIANCE) 10 MG TABS tablet Take 10 mg by mouth daily.   Yes [provider]  FLUoxetine (PROZAC) 10 MG capsule Take 30 mg by mouth daily.   Yes [provider]  furosemide (LASIX) 40 MG tablet Take 20-40 mg by mouth See admin instructions. Take 1 tablet (40mg ) by mouth every morning and take  tablet (20mg ) by mouth every afternoon   Yes [provider]  insulin glargine (LANTUS) 100 UNIT/ML Solostar Pen Inject 40 Units into the skin at bedtime.   Yes [provider]  metFORMIN (GLUCOPHAGE) 1000 MG tablet Take 2,000 mg by mouth daily.   Yes [provider]  Multiple Vitamins-Minerals (MULTIVITAMIN ADULTS 50+) TABS Take 1 tablet by mouth daily.   Yes [provider]  Omega-3 1000 MG CAPS Take 1,000 mg by mouth every 3 (three) days.   Yes [provider]  sacubitril-valsartan (ENTRESTO) 49-51 MG Take 1 tablet by mouth in the morning and at bedtime.   Yes [provider]    Physical Exam: Vitals:   03/29/20 1030 03/29/20 1045 03/29/20 1100 03/29/20 1115  BP: 104/61 114/65 136/85 101/76  Pulse: (!) 51 (!) 52 66 (!) 54  Resp: 13 17 20 19   Temp:      TempSrc:      SpO2: 100% 100% 100% 100%  Weight:      Height:         Vitals:   03/29/20 1030 03/29/20 1045 03/29/20 1100 03/29/20 1115  BP: 104/61 114/65 136/85 101/76  Pulse: (!) 51 (!) 52 66 (!) 54  Resp: 13 17 20 19   Temp:      TempSrc:      SpO2: 100% 100% 100% 100%  Weight:      Height:        Constitutional: NAD, alert and oriented x 3. Patient is on BiPAP Eyes: PERRL, lids and conjunctivae normal ENMT: Mucous membranes are moist.  Neck: normal, supple, no masses, no thyromegaly Respiratory: crackles at the bases, no wheezing. Normal respiratory effort. No accessory muscle use.  Cardiovascular: Bradycardia,no murmurs / rubs / gallops. 2+ extremity edema. 2+ pedal pulses. No carotid bruits.  Abdomen: no tenderness, no masses palpated. No hepatosplenomegaly. Bowel sounds positive.  Musculoskeletal: no clubbing / cyanosis. No joint deformity upper and lower extremities.  Skin: no rashes, lesions, ulcers.  Neurologic:  No gross focal neurologic deficit. Psychiatric: Normal mood and affect.   Labs on Admission: I have personally reviewed following labs and imaging studies  CBC: Recent Labs  Lab 03/29/20 0847  WBC 8.9  NEUTROABS 5.3  HGB 14.6  HCT 45.8  MCV 92.5  PLT 260   Basic Metabolic Panel: Recent Labs  Lab 03/29/20 0847  NA 139  K 4.1  CL 103  CO2 26  GLUCOSE 156*  BUN 18  CREATININE 1.03  CALCIUM 9.1   GFR: Estimated Creatinine Clearance: 85.8 mL/min (by C-G formula based on SCr of 1.03 mg/dL). Liver Function Tests: Recent Labs  Lab 03/29/20 0847  AST 23  ALT 19  ALKPHOS 68  BILITOT 0.8  PROT 7.7  ALBUMIN 3.8  No results for input(s): LIPASE, AMYLASE in the last 168 hours. No results for input(s): AMMONIA in the last 168 hours. Coagulation Profile: Recent Labs  Lab 03/29/20 0847  INR 0.9   Cardiac Enzymes: No results for input(s): CKTOTAL, CKMB, CKMBINDEX, TROPONINI in the last 168 hours. BNP (last 3 results) No results for input(s): PROBNP in the last 8760 hours. HbA1C: No results for input(s): HGBA1C in the last 72 hours. CBG: No results for input(s): GLUCAP in the last 168 hours. Lipid Profile: No results for input(s): CHOL, HDL, LDLCALC, TRIG, CHOLHDL, LDLDIRECT in the last 72 hours. Thyroid Function Tests: No results for input(s): TSH, T4TOTAL, FREET4, T3FREE, THYROIDAB in the last 72 hours. Anemia Panel: No results for input(s): VITAMINB12, FOLATE, FERRITIN, TIBC, IRON, RETICCTPCT in the last 72 hours. Urine analysis:    Component Value Date/Time   COLORURINE YELLOW (A) 03/29/2020 1121   APPEARANCEUR HAZY (A) 03/29/2020 1121   LABSPEC 1.011 03/29/2020 1121   PHURINE 5.0 03/29/2020 1121   GLUCOSEU NEGATIVE 03/29/2020 1121   HGBUR NEGATIVE 03/29/2020 1121   BILIRUBINUR NEGATIVE 03/29/2020 1121   KETONESUR NEGATIVE 03/29/2020 1121   PROTEINUR 100 (A) 03/29/2020 1121   NITRITE NEGATIVE 03/29/2020 1121   LEUKOCYTESUR NEGATIVE 03/29/2020 1121     Radiological Exams on Admission: DG Chest Portable 1 View  Result Date: 03/29/2020 CLINICAL DATA:  Respiratory distress EXAM: PORTABLE CHEST 1 VIEW COMPARISON:  None. FINDINGS: There is cardiomegaly with pulmonary venous hypertension. There is interstitial edema with small left pleural effusion. No consolidation. Status post median sternotomy. No adenopathy. No bone lesions. IMPRESSION: Cardiomegaly with pulmonary vascular congestion. Interstitial edema and small left pleural effusion. Overall appearance indicative of a degree of congestive heart failure. Electronically Signed   By: Bretta Bang III M.D.   On: 03/29/2020 09:06    EKG: Independently reviewed.  Normal sinus rhythm  Assessment/Plan Principal Problem:   Acute on chronic combined systolic and diastolic CHF (congestive heart failure) (HCC) Active Problems:   Diabetes mellitus without complication (HCC)   Hypertension   Acute respiratory failure (HCC)   CAD (coronary artery disease)   Obesity, Class III, BMI 40-49.9 (morbid obesity) (HCC)     Acute on chronic combined systolic and diastolic dysfunction CHF Patient's last known LVEF is 45% from an echo done in December, 2020 Patient presented for evaluation of shortness of breath associated with lower extremity swelling and orthopnea and is found to have an acute exacerbation of his known CHF His blood pressure was significantly elevated upon his arrival to the emergency room with systolic blood pressure in the 180s Optimize blood pressure control Start patient on Lasix 40 mg IV twice daily Maintain low-sodium diet Continue carvedilol with holding parameters due to bradycardia as well as Entresto    Acute respiratory failure Secondary to acute on chronic combined systolic and diastolic dysfunction CHF Patient presented to the ER with increased work of breathing, use of accessory muscles and tachypnea.  His room air pulse oximetry was in the mid 80s He was  placed on BiPAP to reduce work of breathing and improve oxygenation and appears comfortable on Bipap We will attempt to wean patient off BiPAP as tolerated    Diabetes mellitus Maintain consistent carbohydrate diet Hold oral hypoglycemic agents Place patient on sliding scale insulin for glycemic control    History of coronary artery disease Continue Plavix, statin, beta-blocker with holding parameters and fish oil We will obtain serial cardiac enzymes to rule out an acute coronary syndrome  Depression Continue fluoxetine    Morbid obesity (BMI 42.83) Complicates overall prognosis and care    DVT prophylaxis: Lovenox Code Status: Full code Family Communication: Greater than expected time was spent discussing plan of care with patient at the bedside.  All questions and concerns have been addressed.  He verbalizes understanding and agrees with the plan Disposition Plan: Back to previous home environment Consults called: Cardiology    Nathyn Luiz MD [facility]     03/29/2020, 11:38 AM

## 2020-03-29 NOTE — Progress Notes (Signed)
Medication Reconciliation Report  For Home History Technicians  HIGHLIGHTS:  1. The patient WAS personally interviewed 2. If not, what was the main source used: NOT APPLICABLE 3. Does the patient appear to take any anti-coagulation agents (e.g. warfarin, Eliquis or Xarelto): NO 4. Does the patient appear to take any anti-convulsant agents (e.g. divalproex, levetiracetam or phenytoin): NO 5. Does the patient appear to use any insulin products (e.g. Lantus, Novolin or Humalog): YES 6. Does the patient appear to take any "beta-blockers" (e.g. metoprolol, carvedilol or bisoprolol: YES  BARRIERS:  1. Were there any barriers that prevented or complicated the medication reconciliation process: NO 2. If yes, what was the primary barrier encountered: None 3. Does the patient appear compliant with prescribed medications: YES 4. Does the patient express any barriers with compliance: NO 5. What is the primary barrier the patient reports: None   NOTES:[Include any concerns, remarks or complaints the patient expresses regarding medication therapy. Any observations or other information that might be useful to the treatment team can also be included. Immediate needs or concerns should be referred to the RN or appropriate member of the treatment team.]  The patient was asked specifically if he currently takes APIXIBAN or ASPIRIN, both of which he denies.    Elmo Putt, CPhT [place] at [facility] [address] [place], [place] [address] [phone]  ** The above is intended solely for informational and/or communicative purposes. It should in no way be considered an endorsement of any specific treatment, therapy or action. **

## 2020-03-29 NOTE — ED Notes (Signed)
Spoke with pt's wife, Haley Roza r/t pt's status.

## 2020-03-29 NOTE — ED Notes (Signed)
Pt sat 100%. Pt tolerating [place] well. RT notified. Lunch meal tray given at this time.

## 2020-03-29 NOTE — Progress Notes (Signed)
PHARMACIST - PHYSICIAN COMMUNICATION  CONCERNING:  Enoxaparin (Lovenox) for DVT Prophylaxis    RECOMMENDATION: Patient was prescribed enoxaprin 40mg  q24 hours for VTE prophylaxis.   Filed Weights   03/29/20 0906  Weight: 131.5 kg (290 lb)    Body mass index is 42.83 kg/m.  Estimated Creatinine Clearance: 85.8 mL/min (by C-G formula based on SCr of 1.03 mg/dL).   Based on [facility] policy patient is candidate for enoxaparin 0.5mg /kg TBW SQ every 24 hours based on BMI being >30.  DESCRIPTION: Pharmacy has adjusted enoxaparin dose per [facility] policy.  Patient is now receiving enoxaparin 65 mg every 24 hours    Oval Cavazos, PharmD Clinical Pharmacist  03/29/2020 11:41 AM

## 2020-03-30 ENCOUNTER — Inpatient Hospital Stay
Admit: 2020-03-30 | Discharge: 2020-03-30 | Disposition: A | Discharge status: Home / Self Care | Payer: BC Managed Care – PPO | Attending: Cardiovascular Disease | Admitting: Cardiovascular Disease

## 2020-03-30 DIAGNOSIS — I5043 Acute on chronic combined systolic (congestive) and diastolic (congestive) heart failure: Secondary | ICD-10-CM | POA: Diagnosis not present

## 2020-03-30 DIAGNOSIS — I509 Heart failure, unspecified: Secondary | ICD-10-CM

## 2020-03-30 LAB — BASIC METABOLIC PANEL
Anion gap: 5 (ref 5–15)
BUN: 20 mg/dL (ref 8–23)
CO2: 27 mmol/L (ref 22–32)
Calcium: 8.5 mg/dL — ABNORMAL LOW (ref 8.9–10.3)
Chloride: 102 mmol/L (ref 98–111)
Creatinine, Ser: 0.92 mg/dL (ref 0.61–1.24)
GFR, Estimated: >60 mL/min (ref >60)
Glucose, Bld: 138 mg/dL — ABNORMAL HIGH (ref 70–99)
Potassium: 3.5 mmol/L (ref 3.5–5.1)
Sodium: 134 mmol/L — ABNORMAL LOW (ref 135–145)

## 2020-03-30 LAB — HEMOGLOBIN A1C
Hgb A1c MFr Bld: 6.6 % — ABNORMAL HIGH (ref 4.8–5.6)
Mean Plasma Glucose: 142.72 mg/dL

## 2020-03-30 LAB — GLUCOSE, CAPILLARY
Glucose-Capillary: 151 mg/dL — ABNORMAL HIGH (ref 70–99)
Glucose-Capillary: 165 mg/dL — ABNORMAL HIGH (ref 70–99)

## 2020-03-30 LAB — TROPONIN I (HIGH SENSITIVITY): Troponin I (High Sensitivity): 26 ng/L — ABNORMAL HIGH (ref <18)

## 2020-03-30 MED ORDER — PERFLUTREN LIPID MICROSPHERE
1.0000 mL | INTRAVENOUS | Status: AC | PRN
  Administered 2020-03-30: 3.5 mL via INTRAVENOUS
  Filled 2020-03-30: qty 10

## 2020-03-30 MED ORDER — FUROSEMIDE 10 MG/ML IJ SOLN
INTRAMUSCULAR | Status: AC
  Administered 2020-03-30: 40 mg via INTRAVENOUS
  Filled 2020-03-30: qty 4

## 2020-03-30 MED ORDER — ENOXAPARIN SODIUM 60 MG/0.6ML ~~LOC~~ SOLN
0.5000 mg/kg | SUBCUTANEOUS | Status: DC
  Filled 2020-03-30: qty 0.6

## 2020-03-30 MED ORDER — FUROSEMIDE 10 MG/ML IJ SOLN
60.0000 mg | Freq: Two times a day (BID) | INTRAMUSCULAR | Status: DC
  Administered 2020-03-30: 60 mg via INTRAVENOUS
  Filled 2020-03-30: qty 6

## 2020-03-30 MED ORDER — FUROSEMIDE 40 MG PO TABS
40.0000 mg | ORAL_TABLET | Freq: Every day | ORAL | 3 refills | Status: DC
Start: 2020-03-30 — End: 2020-05-16

## 2020-03-30 MED ORDER — INSULIN GLARGINE 100 UNIT/ML ~~LOC~~ SOLN
10.0000 [IU] | Freq: Every day | SUBCUTANEOUS | Status: DC
  Filled 2020-03-30: qty 0.1

## 2020-03-30 NOTE — Progress Notes (Signed)
PHARMACIST - PHYSICIAN COMMUNICATION  CONCERNING:  Enoxaparin (Lovenox) for DVT Prophylaxis    RECOMMENDATION: Patient was prescribed enoxaprin 40mg  q24 hours for VTE prophylaxis adjusted to enoxaparin 65 mg q24h.   Filed Weights   03/29/20 0906 03/29/20 1744 03/30/20 0300  Weight: 131.5 kg (290 lb) 107.9 kg (237 lb 14.4 oz) 106.9 kg (235 lb 9.6 oz)    Body mass index is 34.79 kg/m.  Estimated Creatinine Clearance: 86.2 mL/min (by C-G formula based on SCr of 0.92 mg/dL).   Based on [facility] policy patient is candidate for enoxaparin 0.5mg /kg TBW SQ every 24 hours based on BMI being >30.  DESCRIPTION: There has been a change in weight. Pharmacy has adjusted enoxaparin dose per [facility] policy.  Patient is now receiving enoxaparin 52.5 mg every 24 hours    [facility], PharmD Clinical Pharmacist  03/30/2020 9:09 AM

## 2020-03-30 NOTE — Progress Notes (Signed)
SUBJECTIVE: Patient is breathing much better   Vitals:   03/29/20 1807 03/29/20 2100 03/30/20 0300 03/30/20 0821  BP: 139/81 (!) 152/79 (!) 148/77 (!) 142/71  Pulse: 69 74 75 (!) 53  Resp: 19 19  17   Temp: 98.4 F (36.9 C) 98.4 F (36.9 C) 98.3 F (36.8 C) 97.8 F (36.6 C)  TempSrc:  Oral Oral Oral  SpO2: 100% 100% 100% 100%  Weight:   106.9 kg   Height:        Intake/Output Summary (Last 24 hours) at 03/30/2020 1002 Last data filed at 03/30/2020 0953 Gross per 24 hour  Intake 120 ml  Output 1800 ml  Net -1680 ml    LABS: Basic Metabolic Panel: Recent Labs    03/29/20 0847 03/30/20 0424  NA 139 134*  K 4.1 3.5  CL 103 102  CO2 26 27  GLUCOSE 156* 138*  BUN 18 20  CREATININE 1.03 0.92  CALCIUM 9.1 8.5*   Liver Function Tests: Recent Labs    03/29/20 0847  AST 23  ALT 19  ALKPHOS 68  BILITOT 0.8  PROT 7.7  ALBUMIN 3.8   No results for input(s): LIPASE, AMYLASE in the last 72 hours. CBC: Recent Labs    03/29/20 0847  WBC 8.9  NEUTROABS 5.3  HGB 14.6  HCT 45.8  MCV 92.5  PLT 260   Cardiac Enzymes: No results for input(s): CKTOTAL, CKMB, CKMBINDEX, TROPONINI in the last 72 hours. BNP: Invalid input(s): POCBNP D-Dimer: No results for input(s): DDIMER in the last 72 hours. Hemoglobin A1C: Recent Labs    03/29/20 1808  HGBA1C 6.6*   Fasting Lipid Panel: No results for input(s): CHOL, HDL, LDLCALC, TRIG, CHOLHDL, LDLDIRECT in the last 72 hours. Thyroid Function Tests: No results for input(s): TSH, T4TOTAL, T3FREE, THYROIDAB in the last 72 hours.  Invalid input(s): FREET3 Anemia Panel: No results for input(s): VITAMINB12, FOLATE, FERRITIN, TIBC, IRON, RETICCTPCT in the last 72 hours.   PHYSICAL EXAM General: Well developed, well nourished, in no acute distress HEENT:  Normocephalic and atramatic Neck:  No JVD.  Lungs: Clear bilaterally to auscultation and percussion. Heart: HRRR . Normal S1 and S2 without gallops or murmurs.  Abdomen:  Bowel sounds are positive, abdomen soft and non-tender  Msk:  Back normal, normal gait. Normal strength and tone for age. Extremities: No clubbing, cyanosis or edema.   Neuro: Alert and oriented X 3. Psych:  Good affect, responds appropriately  TELEMETRY: Sinus rhythm  ASSESSMENT AND PLAN: #Stress with history of CABG and coronary artery disease.  Added Aldactone yesterday and continue IV Lasix.  Patient is feeling much better and probably can be discharged with follow-up in the office on Thursday at 10 AM.  Principal Problem:   Acute on chronic combined systolic and diastolic CHF (congestive heart failure) (HCC) Active Problems:   Diabetes mellitus without complication (HCC)   Hypertension   Acute respiratory failure (HCC)   CAD (coronary artery disease)   Obesity, Class III, BMI 40-49.9 (morbid obesity) (HCC)   Depression    Nakyia Dau A, MD, [facility] 03/30/2020 10:02 AM

## 2020-03-30 NOTE — Progress Notes (Signed)
*  PRELIMINARY RESULTS* Echocardiogram 2D Echocardiogram has been performed. Definity IV Contrast used on this study.  Garrel Ridgel Ryman Rathgeber 03/30/2020, 3:54 PM

## 2020-03-30 NOTE — Plan of Care (Signed)

## 2020-03-30 NOTE — Discharge Summary (Signed)
[name] FIE:[id number] DOB: [birth year] DOA: 03/29/2020  [place]  Admit date: 03/29/2020 Discharge date: 03/30/2020  Time spent: 35 minutes  Recommendations for Outpatient Follow-up:  1. Will need check of kidney function and electrolytes in 1-2 weeks    Discharge Diagnoses:  Principal Problem:   Acute on chronic combined systolic and diastolic CHF (congestive heart failure) (HCC) Active Problems:   Diabetes mellitus without complication (HCC)   Hypertension   Acute respiratory failure (HCC)   CAD (coronary artery disease)   Obesity, Class III, BMI 40-49.9 (morbid obesity) (HCC)   Depression   Acute exacerbation of CHF (congestive heart failure) (HCC)   Discharge Condition: good  Diet recommendation: low sodium heart healthy  Filed Weights   03/29/20 0906 03/29/20 1744 03/30/20 0300  Weight: 131.5 kg 107.9 kg 106.9 kg    History of present illness:  Adam Smithis a 74 y.o.malewith medical history significant forchronic systolic heart failure with last known LVEF of 45%, history of hypertension, diabetes mellitus and coronary artery diseasestatus post CABGwho was brought into the ER by EMS for evaluation of respiratory distress. Patient states that he has had progressively worsening shortness of breath over the last couple of days but it got worse on the day of admission. Shortness of breath is associated with lower extremity swelling and orthopnea but he denies having any chest pain. Per EMS his room air pulse oximetry was in the mid 80s upon arrival and he was placed on 6 L of oxygen. When patient arrived to the ER he had pulse oximetry of 87% on 6 L and was then placed on a nonrebreather mask at 15 L with improvement in his pulse oximetry to 95%. Due to accessory muscle use and increased work of breathing patient was then placed on a BiPAP. Patient states that he has had a 10 pound weight gain over the last 1 year and admits to dietary  indiscretion He denies having any nausea, no vomiting, no cough, no fever, no chills no abdominal pain, no changes in his bowel habits, no urinary symptoms, no dizziness or lightheadedness. ED Course:Patient is a 74 year old male with a history of chronic systolic heart failure who was brought into the ER for evaluation of respiratory distress and found to be in CHF.His blood pressure was significantly elevated with SBP of .He was placed on BiPAP in the ER due to increased work of breathing and use of accessory muscles. He received a dose of Lasix in the ER. He will be admitted to the hospital for further evaluation.  Hospital Course:  # Acute on chronic combined systolic and diastolic dysfunction CHF # Acute hypoxic respiratory failure # CAD Patient'slast known LVEF is 45% from an echo done in December, 2020. Patient presented for evaluation of shortness of breath associated with lower extremity swelling and orthopnea and is found to have an acute exacerbation of his known CHF. His blood pressure was significantlyelevated upon his arrival to the emergency room with systolic blood pressure in the 180s. BNP elevated to 1600, cxr w/ pulmonary congestion. Initially required non-rebreather given hypoxia then biapap given increased wob, which was quickly weaned. Weaned off oxygen hospital day 1. 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 UOP 2.5 liters - seen by cardiology Welton Flakes and cleared for d/c today - advised 2 more days of double home dose lasix (so 80 qd) - tte obtained results pending - has outpt f/u in heart failure clinic. Can  see dr. Park Breed for f/u, also has an initial f/u visit w/ [facility] cardiology - can choose either - cont plavix, atorva, coreg, entresto - started on spironolactone here, but pt reveals was on that in the past, discontinued because of gynecomastia, and he declines starting it again  # Diabetes mellitus Maintain consistent carbohydrate  diet - resume home lantus, titrate for fastings 80-120 - hold metformin given chf with exacerbation, possibly indefinitely  # Depression Continued fluoxetine   Procedures:  none   Consultations:  cardiology  Discharge Exam: Vitals:   03/30/20 1206 03/30/20 1632  BP: 137/65 (!) 143/75  Pulse: (!) 53 (!) 58  Resp: 18 19  Temp: (!) 97.4 F (36.3 C) 98.3 F (36.8 C)  SpO2: 100% 97%    General exam: Appears calm and comfortable  Respiratory system: Clear to auscultation. Respiratory effort normal. Cardiovascular system: S1 & S2 heard, RRR. Mod systolic murmur Gastrointestinal system: Abdomen is nondistended, obese, soft and nontender. No organomegaly or masses felt. Normal bowel sounds heard. Central nervous system: Alert and oriented. No focal neurological deficits. Extremities: Symmetric 5 x 5 power. Skin: No rashes, hyperpigmentation of LEs, mild LE edema Psychiatry: Judgement and insight appear normal. Mood & affect appropriate.   Discharge Instructions   Discharge Instructions    (HEART FAILURE PATIENTS) Call MD:  Anytime you have any of the following symptoms: 1) 3 pound weight gain in 24 hours or 5 pounds in 1 week 2) shortness of breath, with or without a dry hacking cough 3) swelling in the hands, feet or stomach 4) if you have to sleep on extra pillows at night in order to breathe.   Complete by: As directed    Call MD for:  difficulty breathing, headache or visual disturbances   Complete by: As directed    Call MD for:  extreme fatigue   Complete by: As directed    Call MD for:  persistant dizziness or light-headedness   Complete by: As directed    Call MD for:  persistant nausea and vomiting   Complete by: As directed    Call MD for:  severe uncontrolled pain   Complete by: As directed    Call MD for:  temperature >100.4   Complete by: As directed    Diet - low sodium heart healthy   Complete by: As directed    Increase activity slowly   Complete by:  As directed    Increase activity slowly   Complete by: As directed      Allergies as of 03/30/2020   No Known Allergies     Medication List    STOP taking these medications   metFORMIN 1000 MG tablet Commonly known as: GLUCOPHAGE     TAKE these medications   ascorbic acid 500 MG tablet Commonly known as: VITAMIN C Take 500 mg by mouth daily.   atorvastatin 80 MG tablet Commonly known as: LIPITOR Take 80 mg by mouth at bedtime.   carvedilol 12.5 MG tablet Commonly known as: COREG Take 12.5 mg by mouth in the morning and at bedtime.   clopidogrel 75 MG tablet Commonly known as: PLAVIX Take 75 mg by mouth every evening.   FLUoxetine 10 MG capsule Commonly known as: PROZAC Take 30 mg by mouth daily.   furosemide 40 MG tablet Commonly known as: LASIX Take 1 tablet (40 mg total) by mouth daily. Take 1 tablet (40mg ) by mouth every morning and take  tablet (20mg ) by mouth every afternoon What changed:  how much to take  when to take this   insulin glargine 100 UNIT/ML Solostar Pen Commonly known as: LANTUS Inject 40 Units into the skin at bedtime.   Jardiance 10 MG Tabs tablet Generic drug: empagliflozin Take 10 mg by mouth daily.   Multivitamin Adults 50+ Tabs Take 1 tablet by mouth daily.   Omega-3 1000 MG Caps Take 1,000 mg by mouth every 3 (three) days.   Renal 1 MG Caps Take 1 capsule by mouth every evening.   sacubitril-valsartan 49-51 MG Commonly known as: ENTRESTO Take 1 tablet by mouth in the morning and at bedtime.      No Known Allergies  Follow-up Information    [facility] Follow up on 04/09/2020.   Specialty: Cardiology Why: at 11:30am. Enter through the Medical Mall entrance Contact information: [address] [place] [address] [phone]       Laurier Nancy, MD. Schedule an appointment as soon as possible for a visit.   Specialty:  Cardiology Contact information: [address] [place] [address] [phone]                The results of significant diagnostics from this hospitalization (including imaging, microbiology, ancillary and laboratory) are listed below for reference.    Significant Diagnostic Studies: DG Chest Portable 1 View  Result Date: 03/29/2020 CLINICAL DATA:  Respiratory distress EXAM: PORTABLE CHEST 1 VIEW COMPARISON:  None. FINDINGS: There is cardiomegaly with pulmonary venous hypertension. There is interstitial edema with small left pleural effusion. No consolidation. Status post median sternotomy. No adenopathy. No bone lesions. IMPRESSION: Cardiomegaly with pulmonary vascular congestion. Interstitial edema and small left pleural effusion. Overall appearance indicative of a degree of congestive heart failure. Electronically Signed   By: Bretta Bang III M.D.   On: 03/29/2020 09:06    Microbiology: Recent Results (from the past 240 hour(s))  Respiratory Panel by RT PCR (Flu A&B, Covid) - Nasopharyngeal Swab     Status: None   Collection Time: 03/29/20  8:47 AM   Specimen: Nasopharyngeal Swab  Result Value Ref Range Status   SARS Coronavirus 2 by RT PCR NEGATIVE NEGATIVE Final    Comment: (NOTE) SARS-CoV-2 target nucleic acids are NOT DETECTED.  The SARS-CoV-2 RNA is generally detectable in upper respiratoy specimens during the acute phase of infection. The lowest concentration of SARS-CoV-2 viral copies this assay can detect is 131 copies/mL. A negative result does not preclude SARS-Cov-2 infection and should not be used as the sole basis for treatment or other patient management decisions. A negative result may occur with  improper specimen collection/handling, submission of specimen other than nasopharyngeal swab, presence of viral mutation(s) within the areas targeted by this assay, and inadequate number of viral copies (<131 copies/mL). A negative result must be  combined with clinical observations, patient history, and epidemiological information. The expected result is Negative.  Fact Sheet for Patients:  https://www.moore.com/  Fact Sheet for Healthcare Providers:  https://www.young.biz/  This test is no t yet approved or cleared by the [place] FDA and  has been authorized for detection and/or diagnosis of SARS-CoV-2 by FDA under an Emergency Use Authorization (EUA). This EUA will remain  in effect (meaning this test can be used) for the duration of the COVID-19 declaration under Section 564(b)(1) of the Act, 21 U.S.C. section 360bbb-3(b)(1), unless the authorization is terminated or revoked sooner.     Influenza A by PCR NEGATIVE NEGATIVE Final  Influenza B by PCR NEGATIVE NEGATIVE Final    Comment: (NOTE) The Xpert Xpress SARS-CoV-2/FLU/RSV assay is intended as an aid in  the diagnosis of influenza from Nasopharyngeal swab specimens and  should not be used as a sole basis for treatment. Nasal washings and  aspirates are unacceptable for Xpert Xpress SARS-CoV-2/FLU/RSV  testing.  Fact Sheet for Patients: https://www.moore.com/  Fact Sheet for Healthcare Providers: https://www.young.biz/  This test is not yet approved or cleared by the [place] FDA and  has been authorized for detection and/or diagnosis of SARS-CoV-2 by  FDA under an Emergency Use Authorization (EUA). This EUA will remain  in effect (meaning this test can be used) for the duration of the  Covid-19 declaration under Section 564(b)(1) of the Act, 21  U.S.C. section 360bbb-3(b)(1), unless the authorization is  terminated or revoked. Performed at [facility], [address] [place], [place] [address]   Blood culture (routine single)     Status: None (Preliminary result)   Collection Time: 03/29/20  8:59 AM   Specimen: BLOOD  Result Value Ref Range Status    Specimen Description BLOOD LEFT FOREARM  Final   Special Requests   Final    BOTTLES DRAWN AEROBIC AND ANAEROBIC Blood Culture adequate volume   Culture   Final    NO GROWTH < 24 HOURS Performed at [facility], [address] [place], [place] [address]    Report Status PENDING  Incomplete     Labs: Basic Metabolic Panel: Recent Labs  Lab 03/29/20 0847 03/30/20 0424  NA 139 134*  K 4.1 3.5  CL 103 102  CO2 26 27  GLUCOSE 156* 138*  BUN 18 20  CREATININE 1.03 0.92  CALCIUM 9.1 8.5*   Liver Function Tests: Recent Labs  Lab 03/29/20 0847  AST 23  ALT 19  ALKPHOS 68  BILITOT 0.8  PROT 7.7  ALBUMIN 3.8   No results for input(s): LIPASE, AMYLASE in the last 168 hours. No results for input(s): AMMONIA in the last 168 hours. CBC: Recent Labs  Lab 03/29/20 0847  WBC 8.9  NEUTROABS 5.3  HGB 14.6  HCT 45.8  MCV 92.5  PLT 260   Cardiac Enzymes: No results for input(s): CKTOTAL, CKMB, CKMBINDEX, TROPONINI in the last 168 hours. BNP: BNP (last 3 results) Recent Labs    03/29/20 0847  BNP 1,604.0*    ProBNP (last 3 results) No results for input(s): PROBNP in the last 8760 hours.  CBG: Recent Labs  Lab 03/29/20 1211 03/29/20 1812 03/29/20 2220 03/30/20 0824 03/30/20 1209  GLUCAP 102* 95 108* 151* 165*       Signed:  Silvano Bilis MD.  [facility] 03/30/2020, 4:59 PM

## 2020-03-30 NOTE — Progress Notes (Signed)
PROGRESS NOTE    Adam Ferguson  JDB:[id number] DOB: 1946/01/01 DOA: 03/29/2020 PCP: has one  Outpatient Specialists: cardiologist in [place], [place]    Brief Narrative:   Adam Ferguson is a 74 y.o. male with medical history significant for chronic systolic heart failure with last known LVEF of 45%, history of hypertension, diabetes mellitus and coronary artery disease status post CABG who was brought into the ER by EMS for evaluation of respiratory distress.  Patient states that he has had progressively worsening shortness of breath over the last couple of days but it got worse on the day of admission.  Shortness of breath is associated with lower extremity swelling and orthopnea but he denies having any chest pain.  Per EMS his room air pulse oximetry was in the mid 80s upon arrival and he was placed on 6 L of oxygen.  When patient arrived to the ER he had pulse oximetry of 87% on 6 L and was then placed on a nonrebreather mask at 15 L with improvement in his pulse oximetry to 95%.  Due to accessory muscle use and increased work of breathing patient was then placed on a BiPAP. Patient states that he has had a 10 pound weight gain over the last 1 year and admits to dietary indiscretion He denies having any nausea, no vomiting, no cough, no fever, no chills no abdominal pain, no changes in his bowel habits, no urinary symptoms, no dizziness or lightheadedness. ED Course: Patient is a 74 year old male with a history of chronic systolic heart failure who was brought into the ER for evaluation of respiratory distress and found to be in CHF. His blood pressure was significantly elevated with SBP of .  He was placed on BiPAP in the ER due to increased work of breathing and use of accessory muscles.  He received a dose of Lasix in the ER.  He will be admitted to the hospital for further evaluation.  Assessment & Plan:   Principal Problem:   Acute on chronic combined systolic and diastolic CHF  (congestive heart failure) (HCC) Active Problems:   Diabetes mellitus without complication (HCC)   Hypertension   Acute respiratory failure (HCC)   CAD (coronary artery disease)   Obesity, Class III, BMI 40-49.9 (morbid obesity) (HCC)   Depression  # Acute on chronic combined systolic and diastolic dysfunction CHF # Acute hypoxic respiratory failure # CAD Patient's last known LVEF is 45% from an echo done in December, 2020. Patient presented for evaluation of shortness of breath associated with lower extremity swelling and orthopnea and is found to have an acute exacerbation of his known CHF. His blood pressure was significantly elevated upon his arrival to the emergency room with systolic blood pressure in the 180s. BNP elevated to 1600, cxr w/ pulmonary congestion. Initially required non-rebreather given hypoxia then biapap given increased wob, which was quickly weaned. Now resting comfortably on 4L [place]. 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 UOP 1500 yesterday, pt says uop has been more or less normal - increase lasix to 60 mg iv bid - strict I/os and daily weights - f/u TTE - repeat troponin (initial normal and denies chest pain and ekg non-ischemic) - cont plavix, atorva, coreg, entresto, spiro - cont [place] O2, wean as able  # Diabetes mellitus Maintain consistent carbohydrate diet - Hold oral hypoglycemic agents - SSI - lants 10 qhs for home 40  # Depression Continue fluoxetine  # Morbid  obesity (BMI 42.83) Complicates overall prognosis and care   DVT prophylaxis: lovenox Code Status: full Family Communication: none @ bedside  Status is: Inpatient  Remains inpatient appropriate because:Inpatient level of care appropriate due to severity of illness   Dispo: The patient is from: Home              Anticipated d/c is to: Home              Anticipated d/c date is: 2 days              Patient currently is not medically stable to  d/c.        Consultants:  cardiology  Procedures: none  Antimicrobials:  none    Subjective: This morning breathing feels improved, no chest pain or abd pain, no n/v/d.   Objective: Vitals:   03/29/20 1744 03/29/20 1807 03/29/20 2100 03/30/20 0300  BP:  139/81 (!) 152/79 (!) 148/77  Pulse:  69 74 75  Resp:  19 19   Temp:  98.4 F (36.9 C) 98.4 F (36.9 C) 98.3 F (36.8 C)  TempSrc:   Oral Oral  SpO2: 100% 100% 100% 100%  Weight: 107.9 kg   106.9 kg  Height:        Intake/Output Summary (Last 24 hours) at 03/30/2020 0819 Last data filed at 03/29/2020 2100 Gross per 24 hour  Intake --  Output 1550 ml  Net -1550 ml   Filed Weights   03/29/20 0906 03/29/20 1744 03/30/20 0300  Weight: 131.5 kg 107.9 kg 106.9 kg    Examination:  General exam: Appears calm and comfortable  Respiratory system: Clear to auscultation. Respiratory effort normal. Cardiovascular system: S1 & S2 heard, RRR. Mod systolic murmur Gastrointestinal system: Abdomen is nondistended, obese, soft and nontender. No organomegaly or masses felt. Normal bowel sounds heard. Central nervous system: Alert and oriented. No focal neurological deficits. Extremities: Symmetric 5 x 5 power. Skin: No rashes, hyperpigmentation of LEs, mild LE edema Psychiatry: Judgement and insight appear normal. Mood & affect appropriate.     Data Reviewed: I have personally reviewed following labs and imaging studies  CBC: Recent Labs  Lab 03/29/20 0847  WBC 8.9  NEUTROABS 5.3  HGB 14.6  HCT 45.8  MCV 92.5  PLT 260   Basic Metabolic Panel: Recent Labs  Lab 03/29/20 0847 03/30/20 0424  NA 139 134*  K 4.1 3.5  CL 103 102  CO2 26 27  GLUCOSE 156* 138*  BUN 18 20  CREATININE 1.03 0.92  CALCIUM 9.1 8.5*   GFR: Estimated Creatinine Clearance: 86.2 mL/min (by C-G formula based on SCr of 0.92 mg/dL). Liver Function Tests: Recent Labs  Lab 03/29/20 0847  AST 23  ALT 19  ALKPHOS 68  BILITOT 0.8   PROT 7.7  ALBUMIN 3.8   No results for input(s): LIPASE, AMYLASE in the last 168 hours. No results for input(s): AMMONIA in the last 168 hours. Coagulation Profile: Recent Labs  Lab 03/29/20 0847  INR 0.9   Cardiac Enzymes: No results for input(s): CKTOTAL, CKMB, CKMBINDEX, TROPONINI in the last 168 hours. BNP (last 3 results) No results for input(s): PROBNP in the last 8760 hours. HbA1C: No results for input(s): HGBA1C in the last 72 hours. CBG: Recent Labs  Lab 03/29/20 1211 03/29/20 1812 03/29/20 2220  GLUCAP 102* 95 108*   Lipid Profile: No results for input(s): CHOL, HDL, LDLCALC, TRIG, CHOLHDL, LDLDIRECT in the last 72 hours. Thyroid Function Tests: No results for input(s): TSH,  T4TOTAL, FREET4, T3FREE, THYROIDAB in the last 72 hours. Anemia Panel: No results for input(s): VITAMINB12, FOLATE, FERRITIN, TIBC, IRON, RETICCTPCT in the last 72 hours. Urine analysis:    Component Value Date/Time   COLORURINE YELLOW (A) 03/29/2020 1121   APPEARANCEUR HAZY (A) 03/29/2020 1121   LABSPEC 1.011 03/29/2020 1121   PHURINE 5.0 03/29/2020 1121   GLUCOSEU NEGATIVE 03/29/2020 1121   HGBUR NEGATIVE 03/29/2020 1121   BILIRUBINUR NEGATIVE 03/29/2020 1121   KETONESUR NEGATIVE 03/29/2020 1121   PROTEINUR 100 (A) 03/29/2020 1121   NITRITE NEGATIVE 03/29/2020 1121   LEUKOCYTESUR NEGATIVE 03/29/2020 1121   Sepsis Labs: @LABRCNTIP (procalcitonin:4,lacticidven:4)  ) Recent Results (from the past 240 hour(s))  Respiratory Panel by RT PCR (Flu A&B, Covid) - Nasopharyngeal Swab     Status: None   Collection Time: 03/29/20  8:47 AM   Specimen: Nasopharyngeal Swab  Result Value Ref Range Status   SARS Coronavirus 2 by RT PCR NEGATIVE NEGATIVE Final    Comment: (NOTE) SARS-CoV-2 target nucleic acids are NOT DETECTED.  The SARS-CoV-2 RNA is generally detectable in upper respiratoy specimens during the acute phase of infection. The lowest concentration of SARS-CoV-2 viral copies  this assay can detect is 131 copies/mL. A negative result does not preclude SARS-Cov-2 infection and should not be used as the sole basis for treatment or other patient management decisions. A negative result may occur with  improper specimen collection/handling, submission of specimen other than nasopharyngeal swab, presence of viral mutation(s) within the areas targeted by this assay, and inadequate number of viral copies (<131 copies/mL). A negative result must be combined with clinical observations, patient history, and epidemiological information. The expected result is Negative.  Fact Sheet for Patients:  03/31/20  Fact Sheet for Healthcare Providers:  https://www.moore.com/  This test is no t yet approved or cleared by the https://www.young.biz/ FDA and  has been authorized for detection and/or diagnosis of SARS-CoV-2 by FDA under an Emergency Use Authorization (EUA). This EUA will remain  in effect (meaning this test can be used) for the duration of the COVID-19 declaration under Section 564(b)(1) of the Act, 21 U.S.C. section 360bbb-3(b)(1), unless the authorization is terminated or revoked sooner.     Influenza A by PCR NEGATIVE NEGATIVE Final   Influenza B by PCR NEGATIVE NEGATIVE Final    Comment: (NOTE) The Xpert Xpress SARS-CoV-2/FLU/RSV assay is intended as an aid in  the diagnosis of influenza from Nasopharyngeal swab specimens and  should not be used as a sole basis for treatment. Nasal washings and  aspirates are unacceptable for Xpert Xpress SARS-CoV-2/FLU/RSV  testing.  Fact Sheet for Patients: [place]  Fact Sheet for Healthcare Providers: https://www.moore.com/  This test is not yet approved or cleared by the https://www.young.biz/ FDA and  has been authorized for detection and/or diagnosis of SARS-CoV-2 by  FDA under an Emergency Use Authorization (EUA). This EUA  will remain  in effect (meaning this test can be used) for the duration of the  Covid-19 declaration under Section 564(b)(1) of the Act, 21  U.S.C. section 360bbb-3(b)(1), unless the authorization is  terminated or revoked. Performed at [facility], [address] [place], [place]   Blood culture (routine single)     Status: None (Preliminary result)   Collection Time: 03/29/20  8:59 AM   Specimen: BLOOD  Result Value Ref Range Status   Specimen Description BLOOD LEFT FOREARM  Final   Special Requests   Final    BOTTLES DRAWN AEROBIC AND  ANAEROBIC Blood Culture adequate volume   Culture   Final    NO GROWTH < 24 HOURS Performed at [facility], [address] [place], [place] [address]    Report Status PENDING  Incomplete         Radiology Studies: DG Chest Portable 1 View  Result Date: 03/29/2020 CLINICAL DATA:  Respiratory distress EXAM: PORTABLE CHEST 1 VIEW COMPARISON:  None. FINDINGS: There is cardiomegaly with pulmonary venous hypertension. There is interstitial edema with small left pleural effusion. No consolidation. Status post median sternotomy. No adenopathy. No bone lesions. IMPRESSION: Cardiomegaly with pulmonary vascular congestion. Interstitial edema and small left pleural effusion. Overall appearance indicative of a degree of congestive heart failure. Electronically Signed   By: Bretta Bang III M.D.   On: 03/29/2020 09:06        Scheduled Meds: . ascorbic acid  500 mg Oral Daily  . atorvastatin  80 mg Oral QHS  . carvedilol  12.5 mg Oral BID WC  . clopidogrel  75 mg Oral QPM  . enoxaparin (LOVENOX) injection  65 mg Subcutaneous Q24H  . FLUoxetine  30 mg Oral Daily  . furosemide  40 mg Intravenous BID  . influenza vaccine adjuvanted  0.5 mL Intramuscular Tomorrow-1000  . insulin aspart  0-20 Units Subcutaneous TID WC  . multivitamin  1 tablet Oral QPM  . multivitamin with minerals  1 tablet Oral Daily  . omega-3  acid ethyl esters  1,000 mg Oral Q3 days  . sacubitril-valsartan  1 tablet Oral BID  . sodium chloride flush  3 mL Intravenous Q12H  . spironolactone  25 mg Oral Daily   Continuous Infusions: . sodium chloride       LOS: 1 day    Time spent: 40 min    Silvano Bilis, MD [facility]   If 7PM-7AM, please contact night-coverage [email or web address] Password [facility] 03/30/2020, 8:19 AM

## 2020-03-30 NOTE — Progress Notes (Signed)
Mobility Specialist - Progress Note   03/30/20 1214  Mobility  Activity Ambulated in hall  Level of Assistance Independent after set-up  Assistive Device None  Distance Ambulated (ft) 350 ft  Mobility Response Tolerated well  Mobility performed by Mobility specialist  $Mobility charge 1 Mobility    Pre-mobility: 54 HR, 99% SpO2 During mobility (4L): 74 HR, 100% SpO2 During mobility (3L): 81 HR, 99% SpO2 Post-mobility: 82 HR, 98% SpO2   Pt was sitting in recliner upon arrival utilizing 4L [place] O2. Pt agreed to session. Pt denied any pain, nausea, or fatigue. Pt stated that he was not on oxygen use prior to admission. Pt was independent in all transfers this date, including ambulation. Mobility set O2 tank on 4L. Pt ambulated 80' with O2 sat at 100%, denying SOB/dizziness or fatigue. Pt agreed to trial ambulation on 3L, ambulating an additional 270' in hallway at steady pace. No LOB noted. After a total of 230', pt began c/o slight labored breathing. No noticeable heavy breathing, O2 sat at 99%. Overall, pt tolerated session well. Upon return to room, pt was reapplied to wall [place] on 3L, O2 sat at 98% at rest. No complaints of labored breathing or distress. Mobility tech educated pt on PLB and demonstrated technique. Nurse was notified and mobility was given the okay to leave pt on 3L before exit.    Filiberto Pinks Mobility Specialist 03/30/20, 12:29 PM

## 2020-03-30 NOTE — Discharge Instructions (Signed)
Heart Failure, Diagnosis  Heart failure is a condition in which the heart has trouble pumping blood because it has become weak or stiff. This means that the heart does not pump blood well enough for the body to stay healthy. For some people with heart failure, fluid may back up into the lungs. There may also be swelling (edema) in the lower legs. Heart failure is usually a long-term (chronic) condition. It is important for you to take good care of yourself and follow the treatment plan from your health care provider. What are the causes? This condition may be caused by:  High blood pressure (hypertension). Hypertension causes the heart muscle to work harder than normal. This makes the heart stiff or weak.  Coronary artery disease, or CAD. CAD is the buildup of cholesterol and fat (plaque) in the arteries of the heart.  Heart attack, also called myocardial infarction. This injures the heart muscle, making it hard for the heart to pump blood.  Abnormal heart valves. The valves do not open and close properly, forcing the heart to pump harder to keep the blood flowing.  Heart muscle disease (cardiomyopathy or myocarditis). This is damage to the heart muscle. It can increase the risk of heart failure.  Lung disease. The heart works harder when the lungs are not healthy.  Abnormal heart rhythms. These can lead to heart failure. What increases the risk? The risk of heart failure increases as a person ages. This condition is also more likely to develop in people who:  Are overweight.  Are male.  Smoke or chew tobacco.  Abuse alcohol or illegal drugs.  Have taken medicines that can damage the heart, such as chemotherapy drugs.  Have diabetes.  Have abnormal heart rhythms.  Have thyroid problems.  Have low blood counts (anemia). What are the signs or symptoms? Symptoms of this condition include:  Shortness of breath with activity, such as when climbing stairs.  A cough that does not  go away.  Swelling of the feet, ankles, legs, or abdomen.  Losing weight for no reason.  Trouble breathing when lying flat (orthopnea).  Waking from sleep because of the need to sit up and get more air.  Rapid heartbeat.  Tiredness (fatigue) and loss of energy.  Feeling light-headed, dizzy, or close to fainting.  Loss of appetite.  Nausea.  Waking up more often during the night to urinate (nocturia).  Confusion. How is this diagnosed? This condition is diagnosed based on:  Your medical history, symptoms, and a physical exam.  Diagnostic tests, which may include: ? Echocardiogram. ? Electrocardiogram (ECG). ? Chest X-ray. ? Blood tests. ? Exercise stress test. ? Radionuclide scans. ? Cardiac catheterization and angiogram. How is this treated? Treatment for this condition is aimed at managing the symptoms of heart failure. Medicines Treatment may include medicines that:  Help lower blood pressure by relaxing (dilating) the blood vessels. These medicines are called ACE inhibitors (angiotensin-converting enzyme) and ARBs (angiotensin receptor blockers).  Cause the kidneys to remove salt and water from the blood through urination (diuretics).  Improve heart muscle strength and prevent the heart from beating too fast (beta blockers).  Increase the force of the heartbeat (digoxin). Healthy behavior changes     Treatment may also include making healthy lifestyle changes, such as:  Reaching and staying at a healthy weight.  Quitting smoking or chewing tobacco.  Eating heart-healthy foods.  Limiting or avoiding alcohol.  Stopping the use of illegal drugs.  Being physically active.  Other treatments   Other treatments may include:  Procedures to open blocked arteries or repair damaged valves.  Placing a pacemaker to improve heart function (cardiac resynchronization therapy).  Placing a device to treat serious abnormal heart rhythms (implantable cardioverter  defibrillator, or ICD).  Placing a device to improve the pumping ability of the heart (left ventricular assist device, or LVAD).  Receiving a healthy heart from a donor (heart transplant). This is done when other treatments have not helped. Follow these instructions at home:  Manage other health conditions as told by your health care provider. These may include hypertension, diabetes, thyroid disease, or abnormal heart rhythms.  Get ongoing education and support as needed. Learn as much as you can about heart failure.  Keep all follow-up visits as told by your health care provider. This is important. Summary  Heart failure is a condition in which the heart has trouble pumping blood because it has become weak or stiff.  This condition is caused by high blood pressure and other diseases of the heart and lungs.  Symptoms of this condition include shortness of breath, tiredness (fatigue), nausea, and swelling of the feet, ankles, legs, or abdomen.  Treatments for this condition may include medicines, lifestyle changes, and surgery.  Manage other health conditions as told by your health care provider. This information is not intended to replace advice given to you by your health care provider. Make sure you discuss any questions you have with your health care provider. Document Revised: 07/21/2018 Document Reviewed: 07/21/2018 Elsevier Patient Education  2020 Elsevier Inc.  

## 2020-03-30 NOTE — Plan of Care (Signed)
Problem: Education: Goal: Knowledge of General Education information will improve Description: Including pain rating scale, medication(s)/side effects and non-pharmacologic comfort measures 03/30/2020 1850 by Ulis Rias, RN Outcome: Adequate for Discharge 03/30/2020 1130 by Ulis Rias, RN Outcome: Adequate for Discharge   Problem: Health Behavior/Discharge Planning: Goal: Ability to manage health-related needs will improve 03/30/2020 1850 by Ulis Rias, RN Outcome: Adequate for Discharge 03/30/2020 1130 by Ulis Rias, RN Outcome: Adequate for Discharge   Problem: Clinical Measurements: Goal: Ability to maintain clinical measurements within normal limits will improve 03/30/2020 1850 by Ulis Rias, RN Outcome: Adequate for Discharge 03/30/2020 1130 by Ulis Rias, RN Outcome: Adequate for Discharge Goal: Will remain free from infection 03/30/2020 1850 by Ulis Rias, RN Outcome: Adequate for Discharge 03/30/2020 1130 by Ulis Rias, RN Outcome: Adequate for Discharge Goal: Diagnostic test results will improve 03/30/2020 1850 by Ulis Rias, RN Outcome: Adequate for Discharge 03/30/2020 1130 by Ulis Rias, RN Outcome: Adequate for Discharge Goal: Respiratory complications will improve 03/30/2020 1850 by Ulis Rias, RN Outcome: Adequate for Discharge 03/30/2020 1130 by Ulis Rias, RN Outcome: Adequate for Discharge Goal: Cardiovascular complication will be avoided 03/30/2020 1850 by Ulis Rias, RN Outcome: Adequate for Discharge 03/30/2020 1130 by Ulis Rias, RN Outcome: Adequate for Discharge   Problem: Activity: Goal: Risk for activity intolerance will decrease 03/30/2020 1850 by Ulis Rias, RN Outcome: Adequate for Discharge 03/30/2020 1130 by Ulis Rias, RN Outcome: Adequate for Discharge   Problem: Nutrition: Goal: Adequate nutrition will be maintained 03/30/2020 1850 by Ulis Rias, RN Outcome: Adequate for Discharge 03/30/2020  1130 by Ulis Rias, RN Outcome: Adequate for Discharge   Problem: Coping: Goal: Level of anxiety will decrease 03/30/2020 1850 by Ulis Rias, RN Outcome: Adequate for Discharge 03/30/2020 1130 by Ulis Rias, RN Outcome: Adequate for Discharge   Problem: Elimination: Goal: Will not experience complications related to bowel motility 03/30/2020 1850 by Ulis Rias, RN Outcome: Adequate for Discharge 03/30/2020 1130 by Ulis Rias, RN Outcome: Adequate for Discharge Goal: Will not experience complications related to urinary retention 03/30/2020 1850 by Ulis Rias, RN Outcome: Adequate for Discharge 03/30/2020 1130 by Ulis Rias, RN Outcome: Adequate for Discharge   Problem: Pain Managment: Goal: General experience of comfort will improve 03/30/2020 1850 by Ulis Rias, RN Outcome: Adequate for Discharge 03/30/2020 1130 by Ulis Rias, RN Outcome: Adequate for Discharge   Problem: Safety: Goal: Ability to remain free from injury will improve 03/30/2020 1850 by Ulis Rias, RN Outcome: Adequate for Discharge 03/30/2020 1130 by Ulis Rias, RN Outcome: Adequate for Discharge   Problem: Skin Integrity: Goal: Risk for impaired skin integrity will decrease 03/30/2020 1850 by Ulis Rias, RN Outcome: Adequate for Discharge 03/30/2020 1130 by Ulis Rias, RN Outcome: Adequate for Discharge   Problem: Education: Goal: Ability to demonstrate management of disease process will improve 03/30/2020 1850 by Ulis Rias, RN Outcome: Adequate for Discharge 03/30/2020 1130 by Ulis Rias, RN Outcome: Adequate for Discharge Goal: Ability to verbalize understanding of medication therapies will improve 03/30/2020 1850 by Ulis Rias, RN Outcome: Adequate for Discharge 03/30/2020 1130 by Ulis Rias, RN Outcome: Adequate for Discharge Goal: Individualized Educational Video(s) 03/30/2020 1850 by Ulis Rias, RN Outcome: Adequate for Discharge 03/30/2020 1130 by  Ulis Rias, RN Outcome: Adequate for Discharge   Problem: Activity: Goal: Capacity to carry out activities will improve 03/30/2020 1850 by Ulis Rias, RN Outcome: Adequate for Discharge 03/30/2020 1130 by Ulis Rias, RN Outcome: Adequate for Discharge   Problem: Cardiac: Goal: Ability to achieve and maintain adequate cardiopulmonary perfusion  will improve 03/30/2020 1850 by Ulis Rias, RN Outcome: Adequate for Discharge 03/30/2020 1130 by Ulis Rias, RN Outcome: Adequate for Discharge

## 2020-03-31 LAB — ECHOCARDIOGRAM COMPLETE
AR max vel: 1.03 cm2
AV Area VTI: 1.16 cm2
AV Area mean vel: 1.15 cm2
AV Mean grad: 8.5 mmHg
AV Peak grad: 17.7 mmHg
Ao pk vel: 2.11 m/s
Area-P 1/2: 2.61 cm2
Height: 69 in
S' Lateral: 5.92 cm
Weight: 3769.6 oz

## 2020-04-03 LAB — CULTURE, BLOOD (SINGLE)
Culture: NO GROWTH
Special Requests: ADEQUATE

## 2020-04-08 NOTE — Progress Notes (Signed)
Patient ID: Adam Ferguson, male    DOB: [birth year], 74 y.o.   MRN: [id number]  HPI  Adam Ferguson is a 74 y/o male with a history of DM, HTN, MI and chronic heart failure.   Echo report from 03/30/20 reviewed and showed an EF of 20-25% along with moderate LAE and trivial Adam.   Admitted 03/29/20 due to acute on chronic HF. Cardiology consult obtained. Initially needed bipap due to increased work of breathing but then quickly weaned off. Initially given IV lasix with transition to oral diuretics. Discharged the following day.   He presents today for his initial visit with a chief complaint of minimal fatigue upon moderate exertion. He describes this as chronic in nature having been present for several years. He has associated cough and head congestion along with this. He denies any difficulty sleeping, dizziness, abdominal distention, palpitations, pedal edema, chest pain or shortness of breath.   Does not have scales yet at home.   Past Medical History:  Diagnosis Date  . CHF (congestive heart failure) (HCC)   . Diabetes mellitus without complication (HCC)   . Hypertension   . MI (myocardial infarction) [facility])    Past Surgical History:  Procedure Laterality Date  . 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 STENT PLACEMENT VASCULAR ([facility] HX)    . VALVE REPLACEMENT     History reviewed. No pertinent family history. Social History   Tobacco Use  . Smoking status: Never Smoker  . Smokeless tobacco: Never Used  Substance Use Topics  . Alcohol use: Not Currently   No Known Allergies Prior to Admission medications   Medication Sig Start Date End Date Taking? Authorizing Provider  ascorbic acid (VITAMIN C) 500 MG tablet Take 500 mg by mouth daily.   Yes [provider]  atorvastatin (LIPITOR) 80 MG tablet Take 80 mg by mouth at bedtime.   Yes [provider]  B Complex-C-Folic Acid (RENAL) 1 MG CAPS Take 1 capsule by mouth every evening.    Yes [provider]  carvedilol (COREG) 12.5 MG tablet Take 12.5 mg by mouth in the morning and at bedtime.   Yes [provider]  clopidogrel (PLAVIX) 75 MG tablet Take 75 mg by mouth every evening.   Yes [provider]  empagliflozin (JARDIANCE) 10 MG TABS tablet Take 10 mg by mouth daily.   Yes [provider]  FLUoxetine (PROZAC) 10 MG capsule Take 30 mg by mouth daily.   Yes [provider]  furosemide (LASIX) 40 MG tablet Take 1 tablet (40 mg total) by mouth daily. Take 1 tablet (40mg ) by mouth every morning and take  tablet (20mg ) by mouth every afternoon Patient taking differently: Take 60 mg by mouth daily.  03/30/20  Yes Wouk, , MD  insulin glargine (LANTUS) 100 UNIT/ML Solostar Pen Inject 40 Units into the skin at bedtime.   Yes [provider]  metFORMIN (GLUCOPHAGE) 1000 MG tablet Take 1,000 mg by mouth 2 (two) times daily with a meal.   Yes [provider]  Multiple Vitamins-Minerals (MULTIVITAMIN ADULTS 50+) TABS Take 1 tablet by mouth daily.   Yes [provider]  Omega-3 1000 MG CAPS Take 1,000 mg by mouth every 3 (three) days.   Yes [provider]  sacubitril-valsartan (ENTRESTO) 49-51 MG Take 1 tablet by mouth in the morning and at bedtime.   Yes [provider]     Review of Systems  Constitutional: Positive for  fatigue. Negative for appetite change.  HENT: Positive for congestion. Negative for postnasal drip and sore throat.   Eyes: Negative.   Respiratory: Positive for cough (mostly dry ). Negative for shortness of breath.   Cardiovascular: Negative for chest pain, palpitations and leg swelling.  Gastrointestinal: Negative for abdominal distention and abdominal pain.  Endocrine: Negative.   Genitourinary: Negative.   Musculoskeletal: Negative for back pain and neck pain.  Skin: Negative.   Allergic/Immunologic: Negative.   Neurological: Negative for dizziness and  light-headedness.  Hematological: Negative for adenopathy. Does not bruise/bleed easily.  Psychiatric/Behavioral: Negative for dysphoric mood and sleep disturbance (sleeping on 2 pillows). The patient is not nervous/anxious.     Vitals:   04/09/20 1156  BP: 131/70  Pulse: (!) 48  Resp: 18  SpO2: 94%  Weight: 230 lb (104.3 kg)  Height: 5\' 9"  (1.753 m)   Wt Readings from Last 3 Encounters:  04/09/20 230 lb (104.3 kg)  03/30/20 235 lb 9.6 oz (106.9 kg)   Lab Results  Component Value Date   CREATININE 0.92 03/30/2020   CREATININE 1.03 03/29/2020    Physical Exam Vitals and nursing note reviewed.  Constitutional:      Appearance: Normal appearance.  HENT:     Head: Normocephalic and atraumatic.  Cardiovascular:     Rate and Rhythm: Regular rhythm. Bradycardia present.  Pulmonary:     Effort: Pulmonary effort is normal. No respiratory distress.     Breath sounds: No wheezing or rales.  Abdominal:     General: There is no distension.     Palpations: Abdomen is soft.  Musculoskeletal:        General: No tenderness.     Cervical back: Normal range of motion and neck supple.     Right lower leg: No edema.     Left lower leg: No edema.  Skin:    General: Skin is warm and dry.  Neurological:     General: No focal deficit present.     Mental Status: He is alert and oriented to person, place, and time.  Psychiatric:        Mood and Affect: Mood normal.        Behavior: Behavior normal.        Thought Content: Thought content normal.    Assessment & Plan:  1: Chronic heart failure with reduced ejection fraction- - NYHA class iI - euvolemic today - scales given to patient today and he was instructed to weigh every morning, write the weight down and call for an overnight weight gain of >2 pounds or a weekly weight gain of >5 pounds - not adding salt and he has been reading food labels for sodium content; low sodium cookbook and dietary information given to him about this -  recently moved here from 03/31/2020 and is going back to his local cardiologist to get records and then will begin care here - has upcoming appointment with cardiology [place]) in ~ 10 days - says that he's tried spironolactone in the past with resultant gynecomastia - HR will not allow for titration of carvedilol - could consider titrating entresto at future visits - BNP 03/29/20 was 1604.0  2: HTN- - BP looks good today - returning to PCP in 03/31/20 to get his records and then is going to get a local PCP - BMP 03/30/20 reviewed and showed sodium 134, potassium 3.5, creatinine 0.92 and GFR >60  3: DM- - A1c 03/29/20 was 6.6% - nonfasting glucose in  clinic today was 102   Patient did not bring his medications nor a list. Each medication was verbally reviewed with the patient and he was encouraged to bring the bottles to every visit to confirm accuracy of list.  Return in 2 months or sooner for any questions/problems before then.

## 2020-04-09 ENCOUNTER — Other Ambulatory Visit: Payer: Self-pay

## 2020-04-09 ENCOUNTER — Ambulatory Visit: Payer: BC Managed Care – PPO | Attending: Family | Admitting: Family

## 2020-04-09 ENCOUNTER — Encounter: Payer: Self-pay | Admitting: Family

## 2020-04-09 VITALS — BP 131/70 | HR 48 | Resp 18 | Ht 69.0 in | Wt 230.0 lb

## 2020-04-09 DIAGNOSIS — I252 Old myocardial infarction: Secondary | ICD-10-CM | POA: Insufficient documentation

## 2020-04-09 DIAGNOSIS — Z951 Presence of aortocoronary bypass graft: Secondary | ICD-10-CM | POA: Insufficient documentation

## 2020-04-09 DIAGNOSIS — I110 Hypertensive heart disease with heart failure: Secondary | ICD-10-CM | POA: Diagnosis not present

## 2020-04-09 DIAGNOSIS — Z952 Presence of prosthetic heart valve: Secondary | ICD-10-CM | POA: Diagnosis not present

## 2020-04-09 DIAGNOSIS — I509 Heart failure, unspecified: Secondary | ICD-10-CM | POA: Diagnosis not present

## 2020-04-09 DIAGNOSIS — E119 Type 2 diabetes mellitus without complications: Secondary | ICD-10-CM | POA: Diagnosis present

## 2020-04-09 DIAGNOSIS — I5022 Chronic systolic (congestive) heart failure: Secondary | ICD-10-CM

## 2020-04-09 DIAGNOSIS — Z955 Presence of coronary angioplasty implant and graft: Secondary | ICD-10-CM | POA: Insufficient documentation

## 2020-04-09 DIAGNOSIS — Z794 Long term (current) use of insulin: Secondary | ICD-10-CM | POA: Insufficient documentation

## 2020-04-09 DIAGNOSIS — I10 Essential (primary) hypertension: Secondary | ICD-10-CM

## 2020-04-09 DIAGNOSIS — Z79899 Other long term (current) drug therapy: Secondary | ICD-10-CM | POA: Diagnosis not present

## 2020-04-09 LAB — GLUCOSE, CAPILLARY: Glucose-Capillary: 102 mg/dL — ABNORMAL HIGH (ref 70–99)

## 2020-04-09 NOTE — Patient Instructions (Addendum)
Begin weighing daily and call for an overnight weight gain of > 2 pounds or a weekly weight gain of >5 pounds. 

## 2020-04-24 DIAGNOSIS — Z952 Presence of prosthetic heart valve: Secondary | ICD-10-CM

## 2020-05-15 ENCOUNTER — Emergency Department: Payer: BC Managed Care – PPO

## 2020-05-15 ENCOUNTER — Inpatient Hospital Stay
Admission: EM | Admit: 2020-05-15 | Discharge: 2020-05-16 | DRG: 291 | Disposition: A | Discharge status: Home / Self Care | Payer: BC Managed Care – PPO | Attending: Family Medicine | Admitting: Family Medicine

## 2020-05-15 ENCOUNTER — Other Ambulatory Visit: Payer: Self-pay

## 2020-05-15 ENCOUNTER — Encounter: Payer: Self-pay | Admitting: Emergency Medicine

## 2020-05-15 DIAGNOSIS — I2510 Atherosclerotic heart disease of native coronary artery without angina pectoris: Secondary | ICD-10-CM | POA: Diagnosis present

## 2020-05-15 DIAGNOSIS — E119 Type 2 diabetes mellitus without complications: Secondary | ICD-10-CM | POA: Diagnosis present

## 2020-05-15 DIAGNOSIS — E785 Hyperlipidemia, unspecified: Secondary | ICD-10-CM | POA: Diagnosis present

## 2020-05-15 DIAGNOSIS — Z6835 Body mass index (BMI) 35.0-35.9, adult: Secondary | ICD-10-CM | POA: Diagnosis not present

## 2020-05-15 DIAGNOSIS — E876 Hypokalemia: Secondary | ICD-10-CM | POA: Diagnosis present

## 2020-05-15 DIAGNOSIS — I25810 Atherosclerosis of coronary artery bypass graft(s) without angina pectoris: Secondary | ICD-10-CM

## 2020-05-15 DIAGNOSIS — R0602 Shortness of breath: Secondary | ICD-10-CM | POA: Diagnosis present

## 2020-05-15 DIAGNOSIS — I5023 Acute on chronic systolic (congestive) heart failure: Secondary | ICD-10-CM | POA: Diagnosis present

## 2020-05-15 DIAGNOSIS — Z955 Presence of coronary angioplasty implant and graft: Secondary | ICD-10-CM

## 2020-05-15 DIAGNOSIS — Z794 Long term (current) use of insulin: Secondary | ICD-10-CM

## 2020-05-15 DIAGNOSIS — I5043 Acute on chronic combined systolic (congestive) and diastolic (congestive) heart failure: Secondary | ICD-10-CM | POA: Diagnosis present

## 2020-05-15 DIAGNOSIS — Z7902 Long term (current) use of antithrombotics/antiplatelets: Secondary | ICD-10-CM

## 2020-05-15 DIAGNOSIS — Z7984 Long term (current) use of oral hypoglycemic drugs: Secondary | ICD-10-CM

## 2020-05-15 DIAGNOSIS — Z952 Presence of prosthetic heart valve: Secondary | ICD-10-CM | POA: Diagnosis not present

## 2020-05-15 DIAGNOSIS — I255 Ischemic cardiomyopathy: Secondary | ICD-10-CM | POA: Diagnosis present

## 2020-05-15 DIAGNOSIS — Z20822 Contact with and (suspected) exposure to covid-19: Secondary | ICD-10-CM | POA: Diagnosis present

## 2020-05-15 DIAGNOSIS — I10 Essential (primary) hypertension: Secondary | ICD-10-CM | POA: Diagnosis present

## 2020-05-15 DIAGNOSIS — I110 Hypertensive heart disease with heart failure: Secondary | ICD-10-CM | POA: Diagnosis present

## 2020-05-15 DIAGNOSIS — I252 Old myocardial infarction: Secondary | ICD-10-CM | POA: Diagnosis not present

## 2020-05-15 DIAGNOSIS — Z79899 Other long term (current) drug therapy: Secondary | ICD-10-CM

## 2020-05-15 DIAGNOSIS — I501 Left ventricular failure: Secondary | ICD-10-CM | POA: Diagnosis present

## 2020-05-15 DIAGNOSIS — I509 Heart failure, unspecified: Secondary | ICD-10-CM

## 2020-05-15 DIAGNOSIS — R001 Bradycardia, unspecified: Secondary | ICD-10-CM | POA: Diagnosis present

## 2020-05-15 DIAGNOSIS — F329 Major depressive disorder, single episode, unspecified: Secondary | ICD-10-CM | POA: Diagnosis present

## 2020-05-15 DIAGNOSIS — J9601 Acute respiratory failure with hypoxia: Secondary | ICD-10-CM | POA: Diagnosis present

## 2020-05-15 DIAGNOSIS — Z951 Presence of aortocoronary bypass graft: Secondary | ICD-10-CM | POA: Diagnosis not present

## 2020-05-15 LAB — BLOOD GAS, VENOUS
Acid-base deficit: 1.3 mmol/L (ref 0.0–2.0)
Bicarbonate: 25.7 mmol/L (ref 20.0–28.0)
O2 Saturation: 92 %
Patient temperature: 37
pCO2, Ven: 51 mmHg (ref 44.0–60.0)
pH, Ven: 7.31 (ref 7.250–7.430)
pO2, Ven: 70 mmHg — ABNORMAL HIGH (ref 32.0–45.0)

## 2020-05-15 LAB — COMPREHENSIVE METABOLIC PANEL
ALT: 20 U/L (ref 0–44)
AST: 23 U/L (ref 15–41)
Albumin: 4 g/dL (ref 3.5–5.0)
Alkaline Phosphatase: 63 U/L (ref 38–126)
Anion gap: 13 (ref 5–15)
BUN: 22 mg/dL (ref 8–23)
CO2: 24 mmol/L (ref 22–32)
Calcium: 8.5 mg/dL — ABNORMAL LOW (ref 8.9–10.3)
Chloride: 104 mmol/L (ref 98–111)
Creatinine, Ser: 1.07 mg/dL (ref 0.61–1.24)
GFR, Estimated: >60 mL/min (ref >60)
Glucose, Bld: 180 mg/dL — ABNORMAL HIGH (ref 70–99)
Potassium: 3.6 mmol/L (ref 3.5–5.1)
Sodium: 141 mmol/L (ref 135–145)
Total Bilirubin: 0.9 mg/dL (ref 0.3–1.2)
Total Protein: 7.4 g/dL (ref 6.5–8.1)

## 2020-05-15 LAB — CBC WITH DIFFERENTIAL/PLATELET
Abs Immature Granulocytes: 0.04 10*3/uL (ref 0.00–0.07)
Basophils Absolute: 0 10*3/uL (ref 0.0–0.1)
Basophils Relative: 0 %
Eosinophils Absolute: 0.2 10*3/uL (ref 0.0–0.5)
Eosinophils Relative: 3 %
HCT: 42.7 % (ref 39.0–52.0)
Hemoglobin: 13.8 g/dL (ref 13.0–17.0)
Immature Granulocytes: 1 %
Lymphocytes Relative: 17 %
Lymphs Abs: 1.5 10*3/uL (ref 0.7–4.0)
MCH: 29.9 pg (ref 26.0–34.0)
MCHC: 32.3 g/dL (ref 30.0–36.0)
MCV: 92.4 fL (ref 80.0–100.0)
Monocytes Absolute: 0.7 10*3/uL (ref 0.1–1.0)
Monocytes Relative: 8 %
Neutro Abs: 6.2 10*3/uL (ref 1.7–7.7)
Neutrophils Relative %: 71 %
Platelets: 239 10*3/uL (ref 150–400)
RBC: 4.62 MIL/uL (ref 4.22–5.81)
RDW: 14.3 % (ref 11.5–15.5)
WBC: 8.7 10*3/uL (ref 4.0–10.5)
nRBC: 0 % (ref 0.0–0.2)

## 2020-05-15 LAB — CBG MONITORING, ED
Glucose-Capillary: 111 mg/dL — ABNORMAL HIGH (ref 70–99)
Glucose-Capillary: 115 mg/dL — ABNORMAL HIGH (ref 70–99)
Glucose-Capillary: 109 mg/dL — ABNORMAL HIGH (ref 70–99)
Glucose-Capillary: 122 mg/dL — ABNORMAL HIGH (ref 70–99)

## 2020-05-15 LAB — BRAIN NATRIURETIC PEPTIDE: B Natriuretic Peptide: 1469.6 pg/mL — ABNORMAL HIGH (ref 0.0–100.0)

## 2020-05-15 LAB — TROPONIN I (HIGH SENSITIVITY)
Troponin I (High Sensitivity): 18 ng/L — ABNORMAL HIGH (ref <18)
Troponin I (High Sensitivity): 57 ng/L — ABNORMAL HIGH (ref <18)

## 2020-05-15 LAB — RESP PANEL BY RT-PCR (FLU A&B, COVID) ARPGX2
Influenza A by PCR: NEGATIVE
Influenza B by PCR: NEGATIVE
SARS Coronavirus 2 by RT PCR: NEGATIVE

## 2020-05-15 MED ORDER — ENOXAPARIN SODIUM 40 MG/0.4ML ~~LOC~~ SOLN: 40.0000 mg | SUBCUTANEOUS | Status: DC

## 2020-05-15 MED ORDER — SPIRONOLACTONE 25 MG PO TABS
12.5000 mg | ORAL_TABLET | Freq: Every day | ORAL | Status: DC
  Administered 2020-05-15: 15:00:00 12.5 mg via ORAL
  Filled 2020-05-15: qty 1
  Filled 2020-05-15 (×2): qty 0.5

## 2020-05-15 MED ORDER — CLOPIDOGREL BISULFATE 75 MG PO TABS
75.0000 mg | ORAL_TABLET | Freq: Every evening | ORAL | Status: DC
  Administered 2020-05-15 – 2020-05-16 (×2): 75 mg via ORAL
  Filled 2020-05-15 (×2): qty 1

## 2020-05-15 MED ORDER — INSULIN ASPART 100 UNIT/ML ~~LOC~~ SOLN
0.0000 [IU] | Freq: Three times a day (TID) | SUBCUTANEOUS | Status: DC
  Administered 2020-05-16 (×2): 2 [IU] via SUBCUTANEOUS
  Filled 2020-05-15 (×2): qty 1

## 2020-05-15 MED ORDER — SODIUM CHLORIDE 0.9 % IV SOLN: 250.0000 mL | INTRAVENOUS | Status: DC | PRN

## 2020-05-15 MED ORDER — SODIUM CHLORIDE 0.9% FLUSH
3.0000 mL | Freq: Two times a day (BID) | INTRAVENOUS | Status: DC
  Administered 2020-05-16 (×2): 3 mL via INTRAVENOUS

## 2020-05-15 MED ORDER — INSULIN GLARGINE 100 UNIT/ML ~~LOC~~ SOLN
40.0000 [IU] | Freq: Every day | SUBCUTANEOUS | Status: DC
  Administered 2020-05-16: 40 [IU] via SUBCUTANEOUS
  Filled 2020-05-15 (×2): qty 0.4

## 2020-05-15 MED ORDER — ENOXAPARIN SODIUM 60 MG/0.6ML ~~LOC~~ SOLN
0.5000 mg/kg | SUBCUTANEOUS | Status: DC
  Administered 2020-05-16: 55 mg via SUBCUTANEOUS
  Filled 2020-05-15 (×2): qty 0.6

## 2020-05-15 MED ORDER — CARVEDILOL 6.25 MG PO TABS
6.2500 mg | ORAL_TABLET | Freq: Two times a day (BID) | ORAL | Status: DC
  Administered 2020-05-15 – 2020-05-16 (×2): 6.25 mg via ORAL
  Filled 2020-05-15 (×2): qty 1

## 2020-05-15 MED ORDER — ONDANSETRON HCL 4 MG/2ML IJ SOLN: 4.0000 mg | Freq: Four times a day (QID) | INTRAMUSCULAR | Status: DC | PRN

## 2020-05-15 MED ORDER — ATORVASTATIN CALCIUM 80 MG PO TABS
80.0000 mg | ORAL_TABLET | Freq: Every day | ORAL | Status: DC
  Administered 2020-05-16: 80 mg via ORAL
  Filled 2020-05-15: qty 4

## 2020-05-15 MED ORDER — SODIUM CHLORIDE 0.9% FLUSH: 3.0000 mL | INTRAVENOUS | Status: DC | PRN

## 2020-05-15 MED ORDER — INSULIN GLARGINE 100 UNIT/ML SOLOSTAR PEN: 40.0000 [IU] | PEN_INJECTOR | Freq: Every day | SUBCUTANEOUS | Status: DC

## 2020-05-15 MED ORDER — FUROSEMIDE 10 MG/ML IJ SOLN
60.0000 mg | Freq: Two times a day (BID) | INTRAMUSCULAR | Status: DC
  Administered 2020-05-15 – 2020-05-16 (×3): 60 mg via INTRAVENOUS
  Filled 2020-05-15: qty 8
  Filled 2020-05-15: qty 6
  Filled 2020-05-15: qty 8

## 2020-05-15 MED ORDER — SACUBITRIL-VALSARTAN 49-51 MG PO TABS
1.0000 | ORAL_TABLET | Freq: Two times a day (BID) | ORAL | Status: DC
  Administered 2020-05-15 – 2020-05-16 (×3): 1 via ORAL
  Filled 2020-05-15 (×4): qty 1

## 2020-05-15 MED ORDER — ACETAMINOPHEN 325 MG PO TABS: 650.0000 mg | ORAL_TABLET | ORAL | Status: DC | PRN

## 2020-05-15 MED ORDER — FUROSEMIDE 10 MG/ML IJ SOLN
60.0000 mg | Freq: Once | INTRAMUSCULAR | Status: AC
  Administered 2020-05-15: 04:00:00 60 mg via INTRAVENOUS
  Filled 2020-05-15: qty 8

## 2020-05-15 MED ORDER — CARVEDILOL 6.25 MG PO TABS: 12.5000 mg | ORAL_TABLET | Freq: Two times a day (BID) | ORAL | Status: DC

## 2020-05-15 MED ORDER — FLUOXETINE HCL 20 MG PO CAPS
30.0000 mg | ORAL_CAPSULE | Freq: Every day | ORAL | Status: DC
  Administered 2020-05-15 – 2020-05-16 (×2): 30 mg via ORAL
  Filled 2020-05-15 (×3): qty 1

## 2020-05-15 NOTE — ED Notes (Signed)
Upon entering room this morning pt was off bipap machine and visualized resting comfortably, NAD noted. Pt denied any pain.   Pt O2 stat has been stable with bipap off.

## 2020-05-15 NOTE — ED Notes (Signed)
Pt continues to be stable with bipap off on 2L O2 via  with 02 stat 95-99% at this time.

## 2020-05-15 NOTE — ED Provider Notes (Addendum)
[facility] Emergency Department Provider Note  ____________________________________________  Time seen: Approximately 4:34 AM  I have reviewed the triage vital signs and the nursing notes.   HISTORY  Chief Complaint Shortness of Breath   HPI Adam Ferguson is a 74 y.o. male with a history of CHF with a EF of 20 to 25%  (echo from November 2021), diabetes, hypertension, CAD who presents from home for shortness of breath.  The patient reports progressively worsening shortness of breath since midnight.  Has noted increased abdominal girth and lower extremity edema.  Positive orthopnea.  Denies any cough, fever, chest pain, vomiting or diarrhea.  Endorses compliance with his Lasix.  Does not use oxygen at home.  Found to be hypoxic to 72 to 75% on room air per EMS and transported on nonrebreather.  Patient denies any personal or family history of blood clots.  Past Medical History:  Diagnosis Date  . CHF (congestive heart failure) (HCC)   . Diabetes mellitus without complication (HCC)   . Hypertension   . MI (myocardial infarction) [facility])     Patient Active Problem List   Diagnosis Date Noted  . Acute exacerbation of CHF (congestive heart failure) (HCC) 03/30/2020  . CHF (congestive heart failure) (HCC) 03/29/2020  . Acute on chronic combined systolic and diastolic CHF (congestive heart failure) (HCC) 03/29/2020  . Depression 03/29/2020  . Diabetes mellitus without complication (HCC)   . Hypertension   . Acute respiratory failure (HCC)   . CAD (coronary artery disease)   . Obesity, Class III, BMI 40-49.9 (morbid obesity) (HCC)     Past Surgical History:  Procedure Laterality Date  . 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 STENT PLACEMENT VASCULAR ([facility] HX)    . VALVE REPLACEMENT      Prior to Admission medications   Medication Sig Start Date End Date Taking? Authorizing Provider  ascorbic acid (VITAMIN C) 500 MG  tablet Take 500 mg by mouth daily.    [provider]  atorvastatin (LIPITOR) 80 MG tablet Take 80 mg by mouth at bedtime.    [provider]  B Complex-C-Folic Acid (RENAL) 1 MG CAPS Take 1 capsule by mouth every evening.    [provider]  carvedilol (COREG) 12.5 MG tablet Take 12.5 mg by mouth in the morning and at bedtime.    [provider]  clopidogrel (PLAVIX) 75 MG tablet Take 75 mg by mouth every evening.    [provider]  empagliflozin (JARDIANCE) 10 MG TABS tablet Take 10 mg by mouth daily.    [provider]  FLUoxetine (PROZAC) 10 MG capsule Take 30 mg by mouth daily.    [provider]  furosemide (LASIX) 40 MG tablet Take 1 tablet (40 mg total) by mouth daily. Take 1 tablet (40mg ) by mouth every morning and take  tablet (20mg ) by mouth every afternoon Patient taking differently: Take 60 mg by mouth daily.  03/30/20   Wouk, , MD  insulin glargine (LANTUS) 100 UNIT/ML Solostar Pen Inject 40 Units into the skin at bedtime.    [provider]  metFORMIN (GLUCOPHAGE) 1000 MG tablet Take 1,000 mg by mouth 2 (two) times daily with a meal.    [provider]  Multiple Vitamins-Minerals (MULTIVITAMIN ADULTS 50+) TABS Take 1 tablet by mouth daily.    [provider]  Omega-3 1000 MG CAPS Take 1,000 mg by mouth every 3 (three) days.    [provider]  sacubitril-valsartan (ENTRESTO) 49-51 MG Take 1 tablet by mouth in the morning and at bedtime.    [provider]    Allergies Patient has no known allergies.  History reviewed. No pertinent family history.  Social History Social History   Tobacco Use  . Smoking status: Never Smoker  . Smokeless tobacco: Never Used  Vaping Use  . Vaping Use: Never used  Substance Use Topics  . Alcohol use: Not Currently  . Drug use: Not Currently    Review of Systems  Constitutional: Negative for fever. Eyes: Negative for  visual changes. ENT: Negative for sore throat. Neck: No neck pain  Cardiovascular: Negative for chest pain. Respiratory: + shortness of breath, orthopnea Gastrointestinal: Negative for abdominal pain, vomiting or diarrhea. Genitourinary: Negative for dysuria. Musculoskeletal: Negative for back pain. Skin: Negative for rash. Neurological: Negative for headaches, weakness or numbness. Psych: No SI or HI  ____________________________________________   PHYSICAL EXAM:  VITAL SIGNS: ED Triage Vitals  Enc Vitals Group     BP --      Pulse Rate 05/15/20 0356 85     Resp 05/15/20 0356 (!) 26     Temp 05/15/20 0356 98.1 F (36.7 C)     Temp Source 05/15/20 0356 Oral     SpO2 05/15/20 0356 100 %     Weight 05/15/20 0357 239 lb 14.4 oz (108.8 kg)     Height 05/15/20 0357 5\' 9"  (1.753 m)     Head Circumference --      Peak Flow --      Pain Score 05/15/20 0357 0     Pain Loc --      Pain Edu? --      Excl. in GC? --     Constitutional: Alert and oriented, moderate respiratory distress. HEENT:      Head: Normocephalic and atraumatic.         Eyes: Conjunctivae are normal. Sclera is non-icteric.       Mouth/Throat: Mucous membranes are moist.       Neck: Supple with no signs of meningismus. Cardiovascular: Regular rate and rhythm. No murmurs, gallops, or rubs. 2+ symmetrical distal pulses are present in all extremities.  Respiratory: Increased work of breathing, tachypneic, hypoxic on room air with crackles bilaterally and diminished breath sounds on bases. Gastrointestinal: Soft, non tender.  Musculoskeletal: 1+ pitting edema bilaterally Neurologic: Normal speech and language. Face is symmetric. Moving all extremities. No gross focal neurologic deficits are appreciated. Skin: Skin is warm, dry and intact. No rash noted. Psychiatric: Mood and affect are normal. Speech and behavior are normal.  ____________________________________________   LABS (all labs ordered are listed, but  only abnormal results are displayed)  Labs Reviewed  BLOOD GAS, VENOUS - Abnormal; Notable for the following components:      Result Value   pO2, Ven 70.0 (*)    All other components within normal limits  COMPREHENSIVE METABOLIC PANEL - Abnormal; Notable for the following components:   Glucose, Bld 180 (*)    Calcium 8.5 (*)    All other components within normal limits  TROPONIN I (HIGH SENSITIVITY) - Abnormal; Notable for the following components:   Troponin I (High Sensitivity) 18 (*)    All other components within normal limits  RESP PANEL BY RT-PCR (FLU A&B, COVID) ARPGX2  CBC WITH DIFFERENTIAL/PLATELET  BRAIN NATRIURETIC PEPTIDE   ____________________________________________  EKG  ED ECG REPORT I, 05/17/20, the attending physician, personally viewed and interpreted this ECG.  Normal sinus rhythm, rate of 80 prolonged QTC, occasional PVCs, no ST elevations, minimal ST depressions in leads II, III and V6.  These are new when compared to prior.   ____________________________________________  RADIOLOGY  I have personally reviewed the images performed during this visit and I agree with the Radiologist's read.   Interpretation by Radiologist:  DG Chest Portable 1 View  Result Date: 05/15/2020 CLINICAL DATA:  74 year old male with sudden onset shortness of breath. EXAM: PORTABLE CHEST 1 VIEW COMPARISON:  Portable chest 03/29/2020. FINDINGS: Portable AP upright view at 0403 hours. Stable lung volumes and mediastinal contours. Mild cardiomegaly. Prior sternotomy. Similar appearance of diffuse increased pulmonary interstitial opacity, evidence of trace fluid in the right minor fissure. No pneumothorax. But there is continued veiling opacity at the left lung base although improved since last month. No air bronchograms. Visualized tracheal air column is within normal limits. No acute osseous abnormality identified. IMPRESSION: Pulmonary interstitial edema suspected. Small left  pleural effusion, decreased since last month. Electronically Signed   By: Odessa Fleming M.D.   On: 05/15/2020 04:18     ____________________________________________   PROCEDURES  Procedure(s) performed:yes .1-3 Lead EKG Interpretation Performed by: Nita Sickle, MD Authorized by: Nita Sickle, MD     Interpretation: non-specific     ECG rate assessment: normal     Rhythm: sinus rhythm     Ectopy: none     Critical Care performed: yes  CRITICAL CARE Performed by: Nita Sickle  ?  Total critical care time: 40 min  Critical care time was exclusive of separately billable procedures and treating other patients.  Critical care was necessary to treat or prevent imminent or life-threatening deterioration.  Critical care was time spent personally by me on the following activities: development of treatment plan with patient and/or surrogate as well as nursing, discussions with consultants, evaluation of patient's response to treatment, examination of patient, obtaining history from patient or surrogate, ordering and performing treatments and interventions, ordering and review of laboratory studies, ordering and review of radiographic studies, pulse oximetry and re-evaluation of patient's condition.  ____________________________________________   INITIAL IMPRESSION / ASSESSMENT AND PLAN / ED COURSE  74 y.o. male with a history of CHF with a EF of 20 to 25%  (echo from November 2021), diabetes, hypertension, CAD who presents from home for shortness of breath, orthopnea and weight gain.  Patient arrives in moderate respiratory distress, was satting in the low 70s when EMS arrived.  Looks volume overloaded.  Patient was placed on BiPAP immediately with improvement of his work of breathing.  Chest x-ray visualized by me consistent with CHF, confirmed by radiology.  EKG with no acute ischemia.  Patient denies any signs or symptoms of infectious etiology.  He has no fever.  Blood  pressure is normal. Covid and flu pending. IV lasxi given. VBG with no signs of CO2 retention.  Kidney function normal, normal electrolytes.  First troponin is at baseline.  BNP is pending.  Will consult hospitalist for admission  Patient placed on telemetry for close monitoring.  Old medical records reviewed including most recent admission in November and results of his echo during that admission    _________________________ 6:10 AM on 05/15/2020 -----------------------------------------  Patient reassessed on BiPAP with significant improvement of respiratory status.  Patient is excepted to the hospitalist service  _____________________________________________ Please note:  Patient was evaluated in Emergency Department today for the symptoms described in the history of present illness. Patient was evaluated in the context of the  global COVID-19 pandemic, which necessitated consideration that the patient might be at risk for infection with the SARS-CoV-2 virus that causes COVID-19. Institutional protocols and algorithms that pertain to the evaluation of patients at risk for COVID-19 are in a state of rapid change based on information released by regulatory bodies including the CDC and federal and state organizations. These policies and algorithms were followed during the patient's care in the ED.  Some ED evaluations and interventions may be delayed as a result of limited staffing during the pandemic.   [place] Controlled Substance Database was reviewed by me. ____________________________________________   FINAL CLINICAL IMPRESSION(S) / ED DIAGNOSES   Final diagnoses:  Acute respiratory failure with hypoxia (HCC)  Acute on chronic congestive heart failure, unspecified heart failure type (HCC)      NEW MEDICATIONS STARTED DURING THIS VISIT:  ED Discharge Orders    None       Note:  This document was prepared using Dragon voice recognition software and may include unintentional dictation  errors.    Nita Sickle, MD 05/15/20 [id number]    Nita Sickle, MD 05/15/20 [phone]

## 2020-05-15 NOTE — ED Triage Notes (Signed)
Pt arrival via [facility] from local hotel due to shob. Pt had an episode about six weeks ago where he had sudden onset shob and was diagnosed with CHF and pulmonary edema. Pt had been fine until this evening when he began having a sudden onset shob. When fire arrived, RA sats were 72-75 % on RA and pt was pale and diaphoretic. EMS put pt on non-rebreather and pt sats steadily climbed up to 100%. Per EMS, pt lung sounds had rales and crackles.   VS with EMS: BP: initial 170/90, later 121/69 HR 90's w PVC's, no elevation noted RR 30 CBG 170

## 2020-05-15 NOTE — H&P (Signed)
History and Physical    Adam Ferguson:[id number] DOB: 1945/06/07 DOA: 05/15/2020  PCP: Patient, No Pcp Per  Patient coming from: Home   Chief Complaint:  Chief Complaint  Patient presents with  . Shortness of Breath     HPI: 74 year old male with past medical history of diabetes mellitus type 2, hypertension, coronary artery disease (S/P CABG), obesity, depression, systolic and diastolic congestive heart failure (Echo 03/2020 EF 20-25%) who presents to [facility] emergency department with complaints of shortness of breath.  Patient explains that over approximately the past 5 days he has developed rapid onset of shortness of breath.  Shortness of breath is severe in intensity, worse with minimal exertion and improved with rest.  Shortness of breath associated pillow orthopnea and paroxysmal nocturnal dyspnea.  Patient also complains of associated bilateral lower extremity edema as well as increasing abdominal girth.  Patient states that he is compliant with all of his medications.  Patient denies chest pain.  Patient is unaware as to whether or not he has gained weight.  Patient denies ingestion of too much salt over the holiday season.  Due to patient's progressively worsening symptoms EMS was contacted.  On their initial evaluation of the patient they reported patient's oxygen saturations in the 70s.  Upon presentation to [facility] emergency department patient was found to be in respiratory distress and was therefore placed on BiPAP therapy by the emergency department provider due to work of breathing.  Clinically, the emergency department provider feels that the patient is suffering from a recurrent bout of acute congestive heart failure and therefore provide the patient 60 mg of IV Lasix.  The hospitalist group is now been called to assess the patient for admission to the hospital.   Review of Systems:   Review of Systems  Respiratory:  Positive for shortness of breath.   Cardiovascular: Positive for orthopnea, leg swelling and PND.  All other systems reviewed and are negative.   Past Medical History:  Diagnosis Date  . CHF (congestive heart failure) (HCC)   . Diabetes mellitus without complication (HCC)   . Hypertension   . MI (myocardial infarction) [facility])     Past Surgical History:  Procedure Laterality Date  . 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 STENT PLACEMENT VASCULAR ([facility] HX)    . VALVE REPLACEMENT       reports that he has never smoked. He has never used smokeless tobacco. He reports previous alcohol use. He reports previous drug use.  No Known Allergies  Family History  Problem Relation Age of Onset  . Other Neg Hx      Prior to Admission medications   Medication Sig Start Date End Date Taking? Authorizing Provider  ascorbic acid (VITAMIN C) 500 MG tablet Take 500 mg by mouth daily.    [provider]  atorvastatin (LIPITOR) 80 MG tablet Take 80 mg by mouth at bedtime.    [provider]  B Complex-C-Folic Acid (RENAL) 1 MG CAPS Take 1 capsule by mouth every evening.    [provider]  carvedilol (COREG) 12.5 MG tablet Take 12.5 mg by mouth in the morning and at bedtime.    [provider]  clopidogrel (PLAVIX) 75 MG tablet Take 75 mg by mouth every evening.    [provider]  empagliflozin (JARDIANCE) 10 MG TABS tablet Take 10 mg by mouth daily.    [provider]  FLUoxetine (PROZAC) 10 MG  capsule Take 30 mg by mouth daily.    [provider]  furosemide (LASIX) 40 MG tablet Take 1 tablet (40 mg total) by mouth daily. Take 1 tablet (40mg ) by mouth every morning and take  tablet (20mg ) by mouth every afternoon Patient taking differently: Take 60 mg by mouth daily.  03/30/20   Wouk, , MD  insulin glargine (LANTUS) 100 UNIT/ML Solostar Pen Inject 40 Units into the skin at bedtime.     [provider]  metFORMIN (GLUCOPHAGE) 1000 MG tablet Take 1,000 mg by mouth 2 (two) times daily with a meal.    [provider]  Multiple Vitamins-Minerals (MULTIVITAMIN ADULTS 50+) TABS Take 1 tablet by mouth daily.    [provider]  Omega-3 1000 MG CAPS Take 1,000 mg by mouth every 3 (three) days.    [provider]  sacubitril-valsartan (ENTRESTO) 49-51 MG Take 1 tablet by mouth in the morning and at bedtime.    [provider]    Physical Exam: Vitals:   05/15/20 0530 05/15/20 0736 05/15/20 0820 05/15/20 0830  BP: 114/61   110/71  Pulse: (!) 53   63  Resp: 18   18  Temp:      TempSrc:      SpO2: 99% 98% 100% 99%  Weight:      Height:        Constitutional: Acute alert and oriented x3, patient is in mild respiratory distress.  Patient is obese. Skin: no rashes, no lesions, good skin turgor noted. Eyes: Pupils are equally reactive to light.  No evidence of scleral icterus or conjunctival pallor.  ENMT: BiPAP mask in place.  Neck: normal, supple, no masses, no thyromegaly.  Notable jugular venous distention at 90 degrees. Respiratory: Significant bibasilar mid field rales.  No evidence of wheezing.. Normal respiratory effort. No accessory muscle use.  Cardiovascular: Regular rate and rhythm, no murmurs / rubs / gallops.  Significant bilateral lower extremity pitting edema tracking from the feet all the way up through the knees.  2+ pedal pulses. No carotid bruits.  Chest:   Nontender without crepitus or deformity.   Back:   Nontender without crepitus or deformity. Abdomen: Abdomen is protuberant but soft and nontender.  No evidence of intra-abdominal masses.  Positive bowel sounds noted in all quadrants.   Musculoskeletal: No joint deformity upper and lower extremities. Good ROM, no contractures. Normal muscle tone.  Neurologic: CN 2-12 grossly intact. Sensation intact.  Patient moving all 4 extremities spontaneously.  Patient is  following all commands.  Patient is responsive to verbal stimuli.   Psychiatric: Patient exhibits normal mood with appropriate affect.  Patient seems to possess insight as to their current situation.     Labs on Admission: I have personally reviewed following labs and imaging studies -   CBC: Recent Labs  Lab 05/15/20 0400  WBC 8.7  NEUTROABS 6.2  HGB 13.8  HCT 42.7  MCV 92.4  PLT 239   Basic Metabolic Panel: Recent Labs  Lab 05/15/20 0400  NA 141  K 3.6  CL 104  CO2 24  GLUCOSE 180*  BUN 22  CREATININE 1.07  CALCIUM 8.5*   GFR: Estimated Creatinine Clearance: 74.7 mL/min (by C-G formula based on SCr of 1.07 mg/dL). Liver Function Tests: Recent Labs  Lab 05/15/20 0400  AST 23  ALT 20  ALKPHOS 63  BILITOT 0.9  PROT 7.4  ALBUMIN 4.0   No results for input(s): LIPASE, AMYLASE in the last 168 hours.  No results for input(s): AMMONIA in the last 168 hours. Coagulation Profile: No results for input(s): INR, PROTIME in the last 168 hours. Cardiac Enzymes: No results for input(s): CKTOTAL, CKMB, CKMBINDEX, TROPONINI in the last 168 hours. BNP (last 3 results) No results for input(s): PROBNP in the last 8760 hours. HbA1C: No results for input(s): HGBA1C in the last 72 hours. CBG: Recent Labs  Lab 05/15/20 0836  GLUCAP 111*   Lipid Profile: No results for input(s): CHOL, HDL, LDLCALC, TRIG, CHOLHDL, LDLDIRECT in the last 72 hours. Thyroid Function Tests: No results for input(s): TSH, T4TOTAL, FREET4, T3FREE, THYROIDAB in the last 72 hours. Anemia Panel: No results for input(s): VITAMINB12, FOLATE, FERRITIN, TIBC, IRON, RETICCTPCT in the last 72 hours. Urine analysis:    Component Value Date/Time   COLORURINE YELLOW (A) 03/29/2020 1121   APPEARANCEUR HAZY (A) 03/29/2020 1121   LABSPEC 1.011 03/29/2020 1121   PHURINE 5.0 03/29/2020 1121   GLUCOSEU NEGATIVE 03/29/2020 1121   HGBUR NEGATIVE 03/29/2020 1121   BILIRUBINUR NEGATIVE 03/29/2020 1121   KETONESUR  NEGATIVE 03/29/2020 1121   PROTEINUR 100 (A) 03/29/2020 1121   NITRITE NEGATIVE 03/29/2020 1121   LEUKOCYTESUR NEGATIVE 03/29/2020 1121    Radiological Exams on Admission - Personally Reviewed: DG Chest Portable 1 View  Result Date: 05/15/2020 CLINICAL DATA:  74 year old male with sudden onset shortness of breath. EXAM: PORTABLE CHEST 1 VIEW COMPARISON:  Portable chest 03/29/2020. FINDINGS: Portable AP upright view at 0403 hours. Stable lung volumes and mediastinal contours. Mild cardiomegaly. Prior sternotomy. Similar appearance of diffuse increased pulmonary interstitial opacity, evidence of trace fluid in the right minor fissure. No pneumothorax. But there is continued veiling opacity at the left lung base although improved since last month. No air bronchograms. Visualized tracheal air column is within normal limits. No acute osseous abnormality identified. IMPRESSION: Pulmonary interstitial edema suspected. Small left pleural effusion, decreased since last month. Electronically Signed   By: Odessa Fleming M.D.   On: 05/15/2020 04:18    EKG: Personally reviewed.  Rhythm is normal sinus rhythm.   Assessment/Plan Principal Problem:   Acute on chronic combined systolic and diastolic CHF (congestive heart failure) (HCC)   Patient presenting with rapidly progressive shortness of breath, peripheral edema, paroxysmal nocturnal dyspnea and pillow orthopnea all concerning for acute on chronic systolic and diastolic congestive heart failure.  Placing patient on Lasix 60 mg IV twice daily  Strict input and output monitoring  Daily weights  Monitoring renal function and electrolytes with serial chemistries  BiPAP therapy to assist with work of breathing for now until patient clinically improves.  Consider cardiology consultation if patient clinically deteriorates.  Active Problems:   Acute cardiogenic pulmonary edema (HCC)   Notable acute cardiogenic pulmonary edema on chest x-ray  Please  see assessment and plan above.    Essential hypertension   Continue home regimen of Coreg, Entresto    CAD (coronary artery disease)   Continue home regimen of beta-blocker, statin therapy, antiplatelet therapy  Monitoring patient on telemetry  Patient currently chest pain-free    Type 2 diabetes mellitus without complication, with long-term current use of insulin (HCC)   Continue home regimen of basal Lantus  Accu-Cheks before every meal and nightly with sliding scale insulin  Hemoglobin A1c pending    Major depressive disorder    Continue home regimen of psychotropic medications   Code Status:  Full code Family Communication: deferred   Status is: Observation  The patient remains OBS appropriate and will d/c before  2 midnights.  Dispo: The patient is from: Home              Anticipated d/c is to: Home              Anticipated d/c date is: 2 days              Patient currently is not medically stable to d/c.        Marinda Elk MD [facility] Pager [phone]  If 7PM-7AM, please contact night-coverage [email or web address] Use universal [place] password for that web site. If you do not have the password, please call the hospital operator.  05/15/2020, 8:49 AM

## 2020-05-15 NOTE — ED Notes (Signed)
Dr Tonna Boehringer notified via secure chat of K+ level 3.0 per labs from earlier -- no new orders at this time; per provider will re-eval tomorrow

## 2020-05-15 NOTE — ED Notes (Signed)
Pt given breakfast tray

## 2020-05-15 NOTE — Consult Note (Signed)
CARDIOLOGY CONSULT NOTE               Patient ID: Adam Ferguson MRN: [id number] DOB/AGE: 08/01/1945 74 y.o.  Admit date: 05/15/2020 Referring Physician Dr Jacquelin Hawking Primary Physician  Primary Cardiologist Dr Arnoldo Hooker Reason for Consultation congestive heart failure shortness of breath  HPI: Patient is a 74 year old male recently moved from [place] presents with acute on chronic systolic congestive heart failure patient recently an echocardiogram done in November EF of 20 to 25% follow-up echocardiogram today looks a little bit worse he has known coronary disease hypertension hyperlipidemia diabetes previous coronary bypass surgery after myocardial infarction he has had PCI and stent in the past has had TAVR for aortic stenosis he has a combination of systolic and diastolic congestive heart failure symptoms diabetes obesity hyperlipidemia presented with dyspnea shortness of breath and was admitted being ruled out for myocardial infarction rate control EKG was benign has been maintained on Entresto Lipitor Plavix Lasix Coreg currently not on spironolactone but is on empagliflozin.  Patient had worsening dyspnea and shortness of breath so came to the emergency room for evaluation  Review of systems complete and found to be negative unless listed above     Past Medical History:  Diagnosis Date  . CHF (congestive heart failure) (HCC)   . Diabetes mellitus without complication (HCC)   . Hypertension   . MI (myocardial infarction) [facility])     Past Surgical History:  Procedure Laterality Date  . 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 STENT PLACEMENT VASCULAR ([facility] HX)    . VALVE REPLACEMENT      (Not in a hospital admission)  Social History   Socioeconomic History  . Marital status: Married    Spouse name: Not on file  . Number of children: Not on file  . Years of education: Not on file  . Highest education level: Not on file   Occupational History  . Not on file  Tobacco Use  . Smoking status: Never Smoker  . Smokeless tobacco: Never Used  Vaping Use  . Vaping Use: Never used  Substance and Sexual Activity  . Alcohol use: Not Currently  . Drug use: Not Currently  . Sexual activity: Not Currently  Other Topics Concern  . Not on file  Social History Narrative  . Not on file   Social Determinants of Health   Financial Resource Strain: Not on file  Food Insecurity: Not on file  Transportation Needs: Not on file  Physical Activity: Not on file  Stress: Not on file  Social Connections: Not on file  Intimate Partner Violence: Not on file    Family History  Problem Relation Age of Onset  . Other Neg Hx       Review of systems complete and found to be negative unless listed above      PHYSICAL EXAM  General: Well developed, well nourished, in no acute distress HEENT:  Normocephalic and atramatic Neck:  No JVD.  Lungs: Clear bilaterally to auscultation and percussion.  Basilar crackles Heart: HRRR . Normal S1 and S2 ,S3 gallops or murmurs.  Abdomen: Bowel sounds are positive, abdomen soft and non-tender  Msk:  Back normal, normal gait. Normal strength and tone for age. Extremities: No clubbing, cyanosis or edema.   Neuro: Alert and oriented X 3. Psych:  Good affect, responds appropriately  Labs:   Lab Results  Component Value Date   WBC 8.7 05/15/2020   HGB 13.8  05/15/2020   HCT 42.7 05/15/2020   MCV 92.4 05/15/2020   PLT 239 05/15/2020    Recent Labs  Lab 05/15/20 0400  NA 141  K 3.6  CL 104  CO2 24  BUN 22  CREATININE 1.07  CALCIUM 8.5*  PROT 7.4  BILITOT 0.9  ALKPHOS 63  ALT 20  AST 23  GLUCOSE 180*   No results found for: CKTOTAL, CKMB, CKMBINDEX, TROPONINI No results found for: CHOL No results found for: HDL No results found for: LDLCALC No results found for: TRIG No results found for: CHOLHDL No results found for: LDLDIRECT    Radiology: DG Chest Portable 1  View  Result Date: 05/15/2020 CLINICAL DATA:  74 year old male with sudden onset shortness of breath. EXAM: PORTABLE CHEST 1 VIEW COMPARISON:  Portable chest 03/29/2020. FINDINGS: Portable AP upright view at 0403 hours. Stable lung volumes and mediastinal contours. Mild cardiomegaly. Prior sternotomy. Similar appearance of diffuse increased pulmonary interstitial opacity, evidence of trace fluid in the right minor fissure. No pneumothorax. But there is continued veiling opacity at the left lung base although improved since last month. No air bronchograms. Visualized tracheal air column is within normal limits. No acute osseous abnormality identified. IMPRESSION: Pulmonary interstitial edema suspected. Small left pleural effusion, decreased since last month. Electronically Signed   By: Odessa Fleming M.D.   On: 05/15/2020 04:18    EKG: Normal sinus rhythm nonspecific ST-T wave changes  ASSESSMENT AND PLAN:  Acute on chronic congestive heart failure systolic dysfunction Severe ischemic cardiomyopathy Hypertension Hyperlipidemia Diabetes type 2 Obesity History of congestive heart failure Shortness of breath Status post TAVR History of PCI and stent History of myocardial infarction . Plan Agree with admit to telemetry Agree with rule out myocardial infarction follow-up EKGs and troponins IV diuretic therapy for heart failure Continue Entresto beta-blocker therapy as well Short-term anticoagulation once MI is ruled out then can discontinue Continue Plavix therapy post TAVR placement Continue diabetes management and control Consider evaluation for possible AICD placement Recommend switching to [place] for diabetes management as well as heart failure therapy Continue to follow-up with heart failure clinic Continue Lipitor therapy for lipid management    Signed: Alwyn Pea MD 05/15/2020, 1:53 PM

## 2020-05-15 NOTE — Progress Notes (Signed)
PROGRESS NOTE    Adam Ferguson  ZDG:[id number] DOB: 03/10/1946 DOA: 05/15/2020 PCP: Patient, No Pcp Per   Brief Narrative: Adam Ferguson is a 75 y.o. male with a history of diabetes, hypertension, CAD s/p CABG, obesity, depression, heart failure. Patient presented secondary to shortness of breath and found to have evidence of acute heart failure. IV lasix initiated.   Assessment & Plan:   Principal Problem:   Acute on chronic combined systolic and diastolic CHF (congestive heart failure) (HCC) Active Problems:   Essential hypertension   CAD (coronary artery disease)   Major depressive disorder   Type 2 diabetes mellitus without complication, with long-term current use of insulin (HCC)   Acute cardiogenic pulmonary edema (HCC)   Acute on chronic combined systolic and diastolic heart failure Unsure of etiology. Patient reports adherence with regimen. No recent illness. He does not believe he has had any dietary indiscretions. Chest x-ray significant for likely pulmonary edema with small left pleural effusion. Lasix IV initiated. Weight of 108.8 kg on admission. -Continue Lasix IV -Daily standing weights and strict in/out -Cardiology consult  Acute respiratory failure with hypoxia Secondary to above. BiPAP initiated on admission -Transition to nasal canula today  CAD History of 3 vessel CABG. Currently on Plavix, Lipitor. -Continue Plavix and Lipitor  Diabetes mellitus, type 2 Hemoglobin A1C of 6.6% from 03/2020. Patient is on Lantus, Jardiance and metformin as an outpatient. Well controlled at this time -Continue Lantus 40 units and SSI  Depression -Continue Prozac  Primary hypertension -Continue Entresto, Coreg  Hyperlipidemia -Continue Lipitor 80 mg  Obesity Body mass index is 35.43 kg/m.   DVT prophylaxis: Lovenox Code Status:   Code Status: Full Code Family Communication: None at bedside Disposition Plan: Discharge likely in 2-4 days pending continued  diuresis with transition back to oral Lasix   Consultants:   Cardiology  Procedures:   None  Antimicrobials:  None    Subjective: Breathing is better than on admission. Not at baseline.  Objective: Vitals:   05/15/20 0530 05/15/20 0736 05/15/20 0820 05/15/20 0830  BP: 114/61   110/71  Pulse: (!) 53   63  Resp: 18   18  Temp:      TempSrc:      SpO2: 99% 98% 100% 99%  Weight:      Height:        Intake/Output Summary (Last 24 hours) at 05/15/2020 0902 Last data filed at 05/15/2020 0701 Gross per 24 hour  Intake --  Output 600 ml  Net -600 ml   Filed Weights   05/15/20 0357  Weight: 108.8 kg    Examination:  General exam: Appears calm and comfortable Respiratory system: Diffuse rales. Respiratory effort normal. Cardiovascular system: S1 & S2 heard, slow heart rate with regular rhythm. No murmurs, rubs, gallops or clicks. Gastrointestinal system: Abdomen is protuberant, soft and nontender. No organomegaly or masses felt. Decreased bowel sounds heard. Central nervous system: Alert and oriented. No focal neurological deficits. Musculoskeletal: BLE 2+ pitting edema. No calf tenderness Skin: No cyanosis. No rashes Psychiatry: Judgement and insight appear normal. Mood & affect appropriate.     Data Reviewed: I have personally reviewed following labs and imaging studies  CBC Lab Results  Component Value Date   WBC 8.7 05/15/2020   RBC 4.62 05/15/2020   HGB 13.8 05/15/2020   HCT 42.7 05/15/2020   MCV 92.4 05/15/2020   MCH 29.9 05/15/2020   PLT 239 05/15/2020   MCHC 32.3 05/15/2020   RDW 14.3 05/15/2020  LYMPHSABS 1.5 05/15/2020   MONOABS 0.7 05/15/2020   EOSABS 0.2 05/15/2020   BASOSABS 0.0 05/15/2020     Last metabolic panel Lab Results  Component Value Date   NA 141 05/15/2020   K 3.6 05/15/2020   CL 104 05/15/2020   CO2 24 05/15/2020   BUN 22 05/15/2020   CREATININE 1.07 05/15/2020   GLUCOSE 180 (H) 05/15/2020   GFRNONAA >60 05/15/2020    CALCIUM 8.5 (L) 05/15/2020   PROT 7.4 05/15/2020   ALBUMIN 4.0 05/15/2020   BILITOT 0.9 05/15/2020   ALKPHOS 63 05/15/2020   AST 23 05/15/2020   ALT 20 05/15/2020   ANIONGAP 13 05/15/2020    CBG (last 3)  Recent Labs    05/15/20 0836  GLUCAP 111*     GFR: Estimated Creatinine Clearance: 74.7 mL/min (by C-G formula based on SCr of 1.07 mg/dL).  Coagulation Profile: No results for input(s): INR, PROTIME in the last 168 hours.  Recent Results (from the past 240 hour(s))  Resp Panel by RT-PCR (Flu A&B, Covid) Nasopharyngeal Swab     Status: None   Collection Time: 05/15/20  4:00 AM   Specimen: Nasopharyngeal Swab; Nasopharyngeal(NP) swabs in vial transport medium  Result Value Ref Range Status   SARS Coronavirus 2 by RT PCR NEGATIVE NEGATIVE Final    Comment: (NOTE) SARS-CoV-2 target nucleic acids are NOT DETECTED.  The SARS-CoV-2 RNA is generally detectable in upper respiratory specimens during the acute phase of infection. The lowest concentration of SARS-CoV-2 viral copies this assay can detect is 138 copies/mL. A negative result does not preclude SARS-Cov-2 infection and should not be used as the sole basis for treatment or other patient management decisions. A negative result may occur with  improper specimen collection/handling, submission of specimen other than nasopharyngeal swab, presence of viral mutation(s) within the areas targeted by this assay, and inadequate number of viral copies(<138 copies/mL). A negative result must be combined with clinical observations, patient history, and epidemiological information. The expected result is Negative.  Fact Sheet for Patients:  BloggerCourse.com  Fact Sheet for Healthcare Providers:  SeriousBroker.it  This test is no t yet approved or cleared by the [place] FDA and  has been authorized for detection and/or diagnosis of SARS-CoV-2 by FDA under an Emergency  Use Authorization (EUA). This EUA will remain  in effect (meaning this test can be used) for the duration of the COVID-19 declaration under Section 564(b)(1) of the Act, 21 U.S.C.section 360bbb-3(b)(1), unless the authorization is terminated  or revoked sooner.       Influenza A by PCR NEGATIVE NEGATIVE Final   Influenza B by PCR NEGATIVE NEGATIVE Final    Comment: (NOTE) The Xpert Xpress SARS-CoV-2/FLU/RSV plus assay is intended as an aid in the diagnosis of influenza from Nasopharyngeal swab specimens and should not be used as a sole basis for treatment. Nasal washings and aspirates are unacceptable for Xpert Xpress SARS-CoV-2/FLU/RSV testing.  Fact Sheet for Patients: BloggerCourse.com  Fact Sheet for Healthcare Providers: SeriousBroker.it  This test is not yet approved or cleared by the [place] FDA and has been authorized for detection and/or diagnosis of SARS-CoV-2 by FDA under an Emergency Use Authorization (EUA). This EUA will remain in effect (meaning this test can be used) for the duration of the COVID-19 declaration under Section 564(b)(1) of the Act, 21 U.S.C. section 360bbb-3(b)(1), unless the authorization is terminated or revoked.  Performed at [facility], [address], [place], [place] [address]  Radiology Studies: DG Chest Portable 1 View  Result Date: 05/15/2020 CLINICAL DATA:  74 year old male with sudden onset shortness of breath. EXAM: PORTABLE CHEST 1 VIEW COMPARISON:  Portable chest 03/29/2020. FINDINGS: Portable AP upright view at 0403 hours. Stable lung volumes and mediastinal contours. Mild cardiomegaly. Prior sternotomy. Similar appearance of diffuse increased pulmonary interstitial opacity, evidence of trace fluid in the right minor fissure. No pneumothorax. But there is continued veiling opacity at the left lung base although improved since last month. No air  bronchograms. Visualized tracheal air column is within normal limits. No acute osseous abnormality identified. IMPRESSION: Pulmonary interstitial edema suspected. Small left pleural effusion, decreased since last month. Electronically Signed   By: Odessa Fleming M.D.   On: 05/15/2020 04:18        Scheduled Meds: . atorvastatin  80 mg Oral QHS  . carvedilol  6.25 mg Oral BID WC  . clopidogrel  75 mg Oral QPM  . enoxaparin (LOVENOX) injection  0.5 mg/kg Subcutaneous Q24H  . FLUoxetine  30 mg Oral Daily  . furosemide  60 mg Intravenous BID  . insulin aspart  0-15 Units Subcutaneous TID AC & HS  . insulin glargine  40 Units Subcutaneous QHS  . sacubitril-valsartan  1 tablet Oral BID  . sodium chloride flush  3 mL Intravenous Q12H   Continuous Infusions: . sodium chloride       LOS: 0 days     Jacquelin Hawking, MD [facility] 05/15/2020, 9:02 AM  If 7PM-7AM, please contact night-coverage [email or web address]

## 2020-05-15 NOTE — Consult Note (Signed)
   Heart Failure Nurse Navigator Note  HFrEF 20-25% by echocardiogram performed in 03/2020.   He presented to the emergency room progressive shortness of breath, increasing abdominal girth and lower extremity edema complains of orthopnea.  Comorbidities:  Obesity Diabetes Hypertension Coronary artery disease/CABG/stents/AVR Depression  Medications:  Atorvastatin 80 mg at at bedtime Coreg 6.25 mg twice daily Plavix 75 mg every p.m. Furosemide 60 mg IV twice daily Entresto 49/51 mg twice daily Spironolactone 12-1/2 mg daily(documented that spironolactone previously caused gynecomastia)   Labs:  Sodium 141, potassium 3.6, chloride 104, CO2 24, BUN 22 creatinine 1.07, hemoglobin 13.8, hematocrit 42.7, troponin XVIII, 57, BNP 1469, albumin 4, AST 23 ALT 20. Weight 108 kg BMI 35 Blood pressure 114/61   Assessment:  General-he is awake and alert, in a gurney in the ED in no acute distress.  HEENT-pupils are equal, nonicteric  Cardiac-tones of regular rate and rhythm.  Chest-clear with fine crackles in the bases bilaterally.  Abdomen rounded, firm.  Skill skeletal-no lower extremity edema noted, skin discoloration significant for venous stasis.  Neurologic-speech is clear, moves all extremities  Psych-is pleasant and appropriate, makes good eye contact.    Initial contact with patient in the emergency room.  He states that he is feeling better but is still not back to baseline.  When questioned as to compliance with the diet, he states that he and his wife have been eating in restaurants a lot because they have just moved in their kitchen is not set up.  He did not feel that he ate anything over the holidays that he should not of.  He is sticks within his fluid restriction.  He does not weigh himself daily, although he has a scale.  Discussed daily weights and recording and stressed that this would be one of the things to do to keep himself out of the hospital.  Also  discussed him contacting Ms. Hackney in the heart failure clinic when he feels that he is getting into trouble or has questions.   Reds clip vest reading completed and resulted as 45.   We will continue to follow patient.   Tresa Endo RN [facility]

## 2020-05-15 NOTE — Progress Notes (Signed)
PHARMACIST - PHYSICIAN COMMUNICATION  CONCERNING:  Enoxaparin (Lovenox) for DVT Prophylaxis    RECOMMENDATION: Patient was prescribed enoxaprin 40mg  q24 hours for VTE prophylaxis.   Filed Weights   05/15/20 0357  Weight: 108.8 kg (239 lb 14.4 oz)    Body mass index is 35.43 kg/m.  Estimated Creatinine Clearance: 74.7 mL/min (by C-G formula based on SCr of 1.07 mg/dL).   Based on [facility] policy patient is candidate for enoxaparin 0.5mg /kg TBW SQ every 24 hours based on BMI being >30.   DESCRIPTION: Pharmacy has adjusted enoxaparin dose per [facility] policy.  Patient is now receiving enoxaparin 55 mg every 24 hours    [facility], PharmD Clinical Pharmacist  05/15/2020 7:34 AM

## 2020-05-15 NOTE — ED Notes (Signed)
Pt voided 1000 ml urine

## 2020-05-16 DIAGNOSIS — I501 Left ventricular failure: Secondary | ICD-10-CM

## 2020-05-16 DIAGNOSIS — I5043 Acute on chronic combined systolic (congestive) and diastolic (congestive) heart failure: Secondary | ICD-10-CM | POA: Diagnosis not present

## 2020-05-16 LAB — GLUCOSE, CAPILLARY
Glucose-Capillary: 90 mg/dL (ref 70–99)
Glucose-Capillary: 144 mg/dL — ABNORMAL HIGH (ref 70–99)
Glucose-Capillary: 109 mg/dL — ABNORMAL HIGH (ref 70–99)

## 2020-05-16 LAB — MAGNESIUM: Magnesium: 2.1 mg/dL (ref 1.7–2.4)

## 2020-05-16 LAB — BASIC METABOLIC PANEL
Anion gap: 8 (ref 5–15)
BUN: 23 mg/dL (ref 8–23)
CO2: 29 mmol/L (ref 22–32)
Calcium: 8.4 mg/dL — ABNORMAL LOW (ref 8.9–10.3)
Chloride: 104 mmol/L (ref 98–111)
Creatinine, Ser: 1.05 mg/dL (ref 0.61–1.24)
GFR, Estimated: >60 mL/min (ref >60)
Glucose, Bld: 102 mg/dL — ABNORMAL HIGH (ref 70–99)
Potassium: 2.8 mmol/L — ABNORMAL LOW (ref 3.5–5.1)
Sodium: 141 mmol/L (ref 135–145)

## 2020-05-16 LAB — POTASSIUM: Potassium: 4.1 mmol/L (ref 3.5–5.1)

## 2020-05-16 MED ORDER — POTASSIUM CHLORIDE CRYS ER 20 MEQ PO TBCR
40.0000 meq | EXTENDED_RELEASE_TABLET | ORAL | Status: AC
Start: 2020-05-16 — End: 2020-05-16
  Administered 2020-05-16 (×2): 40 meq via ORAL
  Filled 2020-05-16 (×2): qty 2

## 2020-05-16 MED ORDER — CARVEDILOL 3.125 MG PO TABS: 3.1250 mg | ORAL_TABLET | Freq: Two times a day (BID) | ORAL | 2 refills | Status: DC

## 2020-05-16 MED ORDER — SPIRONOLACTONE 25 MG PO TABS: 12.5000 mg | ORAL_TABLET | Freq: Every day | ORAL | 0 refills | Status: DC

## 2020-05-16 MED ORDER — FUROSEMIDE 20 MG PO TABS: 60.0000 mg | ORAL_TABLET | Freq: Every day | ORAL | 0 refills | Status: DC

## 2020-05-16 NOTE — Discharge Summary (Signed)
Physician Discharge Summary  Bern Fare CWC:[id number] DOB: April 24, 1946 DOA: 05/15/2020  PCP: Patient, No Pcp Per  Admit date: 05/15/2020 Discharge date: 05/16/2020  Admitted From: Home Disposition: Home  Recommendations for Outpatient Follow-up:  1. Follow up with PCP in 1 week 2. Please obtain BMP/CBC in one week 3. Follow up with cardiology 4. Please follow up on the following pending results: None  Home Health: None Equipment/Devices: None  Discharge Condition: Stable CODE STATUS: Full code Diet recommendation: Heart healthy/carb modified   Brief/Interim Summary:  Admission HPI written by Shauna Hugh, MD   HPI: 74 year old male with past medical history of diabetes mellitus type 2, hypertension, coronary artery disease (S/P CABG), obesity, depression, systolic and diastolic congestive heart failure (Echo 03/2020 EF 20-25%) who presents to [facility] emergency department with complaints of shortness of breath.  Patient explains that over approximately the past 5 days he has developed rapid onset of shortness of breath.  Shortness of breath is severe in intensity, worse with minimal exertion and improved with rest.  Shortness of breath associated pillow orthopnea and paroxysmal nocturnal dyspnea.  Patient also complains of associated bilateral lower extremity edema as well as increasing abdominal girth.  Patient states that he is compliant with all of his medications.  Patient denies chest pain.  Patient is unaware as to whether or not he has gained weight.  Patient denies ingestion of too much salt over the holiday season.  Due to patient's progressively worsening symptoms EMS was contacted.  On their initial evaluation of the patient they reported patient's oxygen saturations in the 70s.  Upon presentation to [facility] emergency department patient was found to be in respiratory distress and was therefore placed on BiPAP  therapy by the emergency department provider due to work of breathing.  Clinically, the emergency department provider feels that the patient is suffering from a recurrent bout of acute congestive heart failure and therefore provide the patient 60 mg of IV Lasix.  The hospitalist group is now been called to assess the patient for admission to the hospital.    Hospital course:  Acute on chronic combined systolic and diastolic heart failure Unsure of etiology. Patient reports adherence with regimen. No recent illness. He does not believe he has had any dietary indiscretions. Chest x-ray significant for likely pulmonary edema with small left pleural effusion. Lasix IV initiated. Weight of 108.8 kg on admission. Weight down to 106.8 kg on discharge. Breathing improved. Net IO Since Admission: -4,080 mL [05/16/20 1703]. Discharge with home Lasix 60 mg daily. Cardiology added spironolactone. Continue Entrestro. Coreg decreased to 3.125 mg BID.  Acute respiratory failure with hypoxia Secondary to above. BiPAP initiated on admission and patient quickly weaned to room air.  CAD History of 3 vessel CABG. Currently on Plavix, Lipitor. Continue Plavix and Lipitor  Diabetes mellitus, type 2 Hemoglobin A1C of 6.6% from 03/2020. Patient is on Lantus, Jardiance and metformin as an outpatient. Well controlled at this time. Continue home regimen.  Hypokalemia Secondary to aggressive diuresis. Supplementation given. No potassium given on discharge secondary to initiation of spironolactone in addition to continuation of Entresto.  Depression Continue Prozac  Primary hypertension Continue Entresto. Coreg dose decreased secondary to bradycardia  Hyperlipidemia Continue Lipitor 80 mg  Obesity Body mass index is 35.43 kg/m.  Discharge Diagnoses:  Principal Problem:   Acute on chronic combined systolic and diastolic CHF (congestive heart failure) (HCC) Active Problems:   Essential hypertension    CAD (coronary  artery disease)   Major depressive disorder   Type 2 diabetes mellitus without complication, with long-term current use of insulin (HCC)   Acute cardiogenic pulmonary edema (HCC)   Acute on chronic combined systolic (congestive) and diastolic (congestive) heart failure [facility])    Discharge Instructions  Discharge Instructions    Call MD for:  difficulty breathing, headache or visual disturbances   Complete by: As directed    Diet - low sodium heart healthy   Complete by: As directed    Increase activity slowly   Complete by: As directed      Allergies as of 05/16/2020   No Known Allergies     Medication List    TAKE these medications   ascorbic acid 500 MG tablet Commonly known as: VITAMIN C Take 500 mg by mouth daily.   atorvastatin 80 MG tablet Commonly known as: LIPITOR Take 80 mg by mouth at bedtime.   carvedilol 3.125 MG tablet Commonly known as: COREG Take 1 tablet (3.125 mg total) by mouth in the morning and at bedtime. What changed:   medication strength  how much to take   clopidogrel 75 MG tablet Commonly known as: PLAVIX Take 75 mg by mouth every evening.   empagliflozin 10 MG Tabs tablet Commonly known as: JARDIANCE Take 10 mg by mouth daily.   FLUoxetine 40 MG capsule Commonly known as: PROZAC Take 40 mg by mouth daily.   furosemide 20 MG tablet Commonly known as: Lasix Take 3 tablets (60 mg total) by mouth daily. What changed:   medication strength  how much to take  additional instructions  Another medication with the same name was removed. Continue taking this medication, and follow the directions you see here.   insulin glargine 100 UNIT/ML Solostar Pen Commonly known as: LANTUS Inject 40 Units into the skin at bedtime.   metFORMIN 1000 MG tablet Commonly known as: GLUCOPHAGE Take 1,000 mg by mouth 2 (two) times daily with a meal.   Multivitamin Adults 50+ Tabs Take 1 tablet by mouth daily.   Omega-3 1000 MG  Caps Take 1,000 mg by mouth every 3 (three) days.   Renal 1 MG Caps Take 1 capsule by mouth every evening.   sacubitril-valsartan 49-51 MG Commonly known as: ENTRESTO Take 1 tablet by mouth in the morning and at bedtime.   spironolactone 25 MG tablet Commonly known as: ALDACTONE Take 0.5 tablets (12.5 mg total) by mouth daily. Start taking on: May 17, 2020       Follow-up Information    [facility] Follow up on 05/26/2020.   Specialty: Cardiology Why: at 9:30am. Enter through the [facility] Mall entrance Contact information: [address] [place] [address] [phone]             No Known Allergies  Consultations:  Cardiology   Procedures/Studies: DG Chest Portable 1 View  Result Date: 05/15/2020 CLINICAL DATA:  74 year old male with sudden onset shortness of breath. EXAM: PORTABLE CHEST 1 VIEW COMPARISON:  Portable chest 03/29/2020. FINDINGS: Portable AP upright view at 0403 hours. Stable lung volumes and mediastinal contours. Mild cardiomegaly. Prior sternotomy. Similar appearance of diffuse increased pulmonary interstitial opacity, evidence of trace fluid in the right minor fissure. No pneumothorax. But there is continued veiling opacity at the left lung base although improved since last month. No air bronchograms. Visualized tracheal air column is within normal limits. No acute osseous abnormality identified. IMPRESSION: Pulmonary interstitial edema suspected. Small left  pleural effusion, decreased since last month. Electronically Signed   By: Odessa Fleming M.D.   On: 05/15/2020 04:18      Subjective: No chest pain. Mild dyspnea but very close to baseline.  Discharge Exam: Vitals:   05/16/20 1142 05/16/20 1646  BP: 125/67 131/81  Pulse: (!) 59 (!) 52  Resp: 16 19  Temp: 97.7 F (36.5 C) 98.2 F (36.8 C)  SpO2: 94% 98%   Vitals:   05/16/20 0756 05/16/20 1142 05/16/20 1142 05/16/20  1646  BP: 127/72  125/67 131/81  Pulse: (!) 57  (!) 59 (!) 52  Resp: 17  16 19   Temp: 98 F (36.7 C)  97.7 F (36.5 C) 98.2 F (36.8 C)  TempSrc: Oral  Oral Oral  SpO2: 96%  94% 98%  Weight:  106.8 kg    Height:  5\' 9"  (1.753 m)      General: Pt is alert, awake, not in acute distress Cardiovascular: RRR, S1/S2 +, no rubs, no gallops Respiratory: CTA bilaterally, no wheezing, no rhonchi Abdominal: Soft, NT, ND, bowel sounds + Extremities: no cyanosis    The results of significant diagnostics from this hospitalization (including imaging, microbiology, ancillary and laboratory) are listed below for reference.     Microbiology: Recent Results (from the past 240 hour(s))  Resp Panel by RT-PCR (Flu A&B, Covid) Nasopharyngeal Swab     Status: None   Collection Time: 05/15/20  4:00 AM   Specimen: Nasopharyngeal Swab; Nasopharyngeal(NP) swabs in vial transport medium  Result Value Ref Range Status   SARS Coronavirus 2 by RT PCR NEGATIVE NEGATIVE Final    Comment: (NOTE) SARS-CoV-2 target nucleic acids are NOT DETECTED.  The SARS-CoV-2 RNA is generally detectable in upper respiratory specimens during the acute phase of infection. The lowest concentration of SARS-CoV-2 viral copies this assay can detect is 138 copies/mL. A negative result does not preclude SARS-Cov-2 infection and should not be used as the sole basis for treatment or other patient management decisions. A negative result may occur with  improper specimen collection/handling, submission of specimen other than nasopharyngeal swab, presence of viral mutation(s) within the areas targeted by this assay, and inadequate number of viral copies(<138 copies/mL). A negative result must be combined with clinical observations, patient history, and epidemiological information. The expected result is Negative.  Fact Sheet for Patients:  BloggerCourse.com  Fact Sheet for Healthcare Providers:   SeriousBroker.it  This test is no t yet approved or cleared by the [place] FDA and  has been authorized for detection and/or diagnosis of SARS-CoV-2 by FDA under an Emergency Use Authorization (EUA). This EUA will remain  in effect (meaning this test can be used) for the duration of the COVID-19 declaration under Section 564(b)(1) of the Act, 21 U.S.C.section 360bbb-3(b)(1), unless the authorization is terminated  or revoked sooner.       Influenza A by PCR NEGATIVE NEGATIVE Final   Influenza B by PCR NEGATIVE NEGATIVE Final    Comment: (NOTE) The Xpert Xpress SARS-CoV-2/FLU/RSV plus assay is intended as an aid in the diagnosis of influenza from Nasopharyngeal swab specimens and should not be used as a sole basis for treatment. Nasal washings and aspirates are unacceptable for Xpert Xpress SARS-CoV-2/FLU/RSV testing.  Fact Sheet for Patients: BloggerCourse.com  Fact Sheet for Healthcare Providers: SeriousBroker.it  This test is not yet approved or cleared by the [place] FDA and has been authorized for detection and/or diagnosis of SARS-CoV-2 by FDA under an Emergency Use Authorization (EUA). This EUA  will remain in effect (meaning this test can be used) for the duration of the COVID-19 declaration under Section 564(b)(1) of the Act, 21 U.S.C. section 360bbb-3(b)(1), unless the authorization is terminated or revoked.  Performed at [facility], [address] [place], [place] [address]      Labs: BNP (last 3 results) Recent Labs    03/29/20 0847 05/15/20 0400  BNP 1,604.0* 1,469.6*   Basic Metabolic Panel: Recent Labs  Lab 05/15/20 0400 05/16/20 0545 05/16/20 1558  NA 141 141  --   K 3.6 2.8* 4.1  CL 104 104  --   CO2 24 29  --   GLUCOSE 180* 102*  --   BUN 22 23  --   CREATININE 1.07 1.05  --   CALCIUM 8.5* 8.4*  --   MG  --  2.1  --    Liver Function  Tests: Recent Labs  Lab 05/15/20 0400  AST 23  ALT 20  ALKPHOS 63  BILITOT 0.9  PROT 7.4  ALBUMIN 4.0   No results for input(s): LIPASE, AMYLASE in the last 168 hours. No results for input(s): AMMONIA in the last 168 hours. CBC: Recent Labs  Lab 05/15/20 0400  WBC 8.7  NEUTROABS 6.2  HGB 13.8  HCT 42.7  MCV 92.4  PLT 239   Cardiac Enzymes: No results for input(s): CKTOTAL, CKMB, CKMBINDEX, TROPONINI in the last 168 hours. BNP: Invalid input(s): POCBNP CBG: Recent Labs  Lab 05/15/20 1713 05/15/20 2325 05/16/20 0757 05/16/20 1142 05/16/20 1604  GLUCAP 109* 122* 90 144* 109*   D-Dimer No results for input(s): DDIMER in the last 72 hours. Hgb A1c No results for input(s): HGBA1C in the last 72 hours. Lipid Profile No results for input(s): CHOL, HDL, LDLCALC, TRIG, CHOLHDL, LDLDIRECT in the last 72 hours. Thyroid function studies No results for input(s): TSH, T4TOTAL, T3FREE, THYROIDAB in the last 72 hours.  Invalid input(s): FREET3 Anemia work up No results for input(s): VITAMINB12, FOLATE, FERRITIN, TIBC, IRON, RETICCTPCT in the last 72 hours. Urinalysis    Component Value Date/Time   COLORURINE YELLOW (A) 03/29/2020 1121   APPEARANCEUR HAZY (A) 03/29/2020 1121   LABSPEC 1.011 03/29/2020 1121   PHURINE 5.0 03/29/2020 1121   GLUCOSEU NEGATIVE 03/29/2020 1121   HGBUR NEGATIVE 03/29/2020 1121   BILIRUBINUR NEGATIVE 03/29/2020 1121   KETONESUR NEGATIVE 03/29/2020 1121   PROTEINUR 100 (A) 03/29/2020 1121   NITRITE NEGATIVE 03/29/2020 1121   LEUKOCYTESUR NEGATIVE 03/29/2020 1121   Sepsis Labs Invalid input(s): PROCALCITONIN,  WBC,  LACTICIDVEN Microbiology Recent Results (from the past 240 hour(s))  Resp Panel by RT-PCR (Flu A&B, Covid) Nasopharyngeal Swab     Status: None   Collection Time: 05/15/20  4:00 AM   Specimen: Nasopharyngeal Swab; Nasopharyngeal(NP) swabs in vial transport medium  Result Value Ref Range Status   SARS Coronavirus 2 by RT PCR  NEGATIVE NEGATIVE Final    Comment: (NOTE) SARS-CoV-2 target nucleic acids are NOT DETECTED.  The SARS-CoV-2 RNA is generally detectable in upper respiratory specimens during the acute phase of infection. The lowest concentration of SARS-CoV-2 viral copies this assay can detect is 138 copies/mL. A negative result does not preclude SARS-Cov-2 infection and should not be used as the sole basis for treatment or other patient management decisions. A negative result may occur with  improper specimen collection/handling, submission of specimen other than nasopharyngeal swab, presence of viral mutation(s) within the areas targeted by this assay, and inadequate number of viral copies(<138 copies/mL). A  negative result must be combined with clinical observations, patient history, and epidemiological information. The expected result is Negative.  Fact Sheet for Patients:  BloggerCourse.com  Fact Sheet for Healthcare Providers:  SeriousBroker.it  This test is no t yet approved or cleared by the [place] FDA and  has been authorized for detection and/or diagnosis of SARS-CoV-2 by FDA under an Emergency Use Authorization (EUA). This EUA will remain  in effect (meaning this test can be used) for the duration of the COVID-19 declaration under Section 564(b)(1) of the Act, 21 U.S.C.section 360bbb-3(b)(1), unless the authorization is terminated  or revoked sooner.       Influenza A by PCR NEGATIVE NEGATIVE Final   Influenza B by PCR NEGATIVE NEGATIVE Final    Comment: (NOTE) The Xpert Xpress SARS-CoV-2/FLU/RSV plus assay is intended as an aid in the diagnosis of influenza from Nasopharyngeal swab specimens and should not be used as a sole basis for treatment. Nasal washings and aspirates are unacceptable for Xpert Xpress SARS-CoV-2/FLU/RSV testing.  Fact Sheet for Patients: BloggerCourse.com  Fact Sheet for  Healthcare Providers: SeriousBroker.it  This test is not yet approved or cleared by the [place] FDA and has been authorized for detection and/or diagnosis of SARS-CoV-2 by FDA under an Emergency Use Authorization (EUA). This EUA will remain in effect (meaning this test can be used) for the duration of the COVID-19 declaration under Section 564(b)(1) of the Act, 21 U.S.C. section 360bbb-3(b)(1), unless the authorization is terminated or revoked.  Performed at [facility], [address], [place], [place] [address]      Time coordinating discharge: 35 minutes  SIGNED:   Jacquelin Hawking, MD [facility] 05/16/2020, 5:03 PM

## 2020-05-16 NOTE — Discharge Instructions (Signed)
Adam Ferguson,  You were in the hospital with a heart failure exacerbation. You were treated with lasix and have improved significantly. Please follow-up with your PCP and heart doctor. Please take medications as prescribed as they have been adjusted.

## 2020-05-16 NOTE — Progress Notes (Signed)
   Heart Failure Nurse Navigator Note  HFrEF 20 -25% by echocardiogram performed 03/2020  He presented to the emergency room with progressive shortness of breath, increasing abdominal girth, lower extremity edema and complaints of orthopnea.   Comorbidities:  Obesity Diabetes Hypertension Coronary artery disease/CABG/stents/AVR Depression   Medications:  Atorvastatin 80 mg at bedtime Coreg 0.25 mg twice a day Plavix 75 mg every p.m. Furosemide 60 mg IV twice daily Entresto 49/51 twice daily Spironolactone 12-1/2 mg daily (documented that spironolactone previously because gynecomastia)   Labs:  Sodium 141, potassium 2.8, chloride 104, CO2 29, BUN 23, creatinine 1.05 Intake was not documented Output 3000 mL Weight 102.9 kg yesterday documented at 108.8 Blood pressure 127/72 BMI 44.3   Assessment:  General-he is awake and alert sitting up in the chair at bedside, in no acute distress.  HEENT-pupils are equal, wears glasses.  No JVD noted.  Cardiac-heart tones of regular rate and rhythm.  No murmurs or gallops appreciated.  Chest-breath sounds are clear to posterior auscultation.  Abdomen-rounded soft nontender  Musculoskeletal- no lower extremity edema noted.  Psych-is pleasant and appropriate, makes good eye contact.  Neurologic-speech is clear, moves all extremities without difficulty.    Visited today with patient.  He feels that his breathing is not quite back to normal, discussed that he has lost almost 13 pounds since his admission from yesterday.  Again discussed the importance of weighing daily and recording and reporting to his physician or to [facility] in the heart failure clinic the appropriate weight gains or change in symptoms.  Also discussed again today 2000 mg sodium restriction.  He admits that the day of admission he had eaten bacon sandwich and peanut butter and crackers along with eating out at restaurants.  States that this is a stressful time  for him as he and his wife are separated at this time.  We talked about the importance of reading labels and if he should want to eat bacon, to work that into his daily allotment, he voices understanding.   Reds clip reading today was 41, yesterday's result was 45.   Tresa Endo RN [facility]

## 2020-05-16 NOTE — ED Notes (Signed)
Pt sitting up in bed watching tv -- no acute distress noted.  RR even and unlabored on RA; O2 sats stable.  Cardiac monitoring maintained.  Abdomen obese but soft.  Skin warm dry and intact.  Continent of urine -- 1000cc clear yellow urine emptied from urinal at this time.  Call bell in reach - will monitor for acute changes and maintain plan of care as pt awaits bed assignment.

## 2020-05-16 NOTE — ED Notes (Signed)
Pt remains awake and alert; GCS 15.  Denies any complaints at this time and states sob has significantly improved since arrival.  RR even and unlabored on RA.  Will continue to monitor for acute changes as pt awaits transport to admit bed.

## 2020-05-16 NOTE — Progress Notes (Signed)
[facility] Cardiology    SUBJECTIVE: Patient feels much improved reduced shortness of breath improved energy significant weight loss no chest pain now here for follow-up evaluation   Vitals:   05/16/20 0044 05/16/20 0100 05/16/20 0459 05/16/20 0756  BP:  128/74 (!) 105/57 127/72  Pulse:   (!) 50 (!) 57  Resp:  18 14 17   Temp:  98.1 F (36.7 C) 97.8 F (36.6 C) 98 F (36.7 C)  TempSrc:  Oral Oral   SpO2:  95% 99% 96%  Weight: 102.9 kg     Height: 5' (1.524 m)        Intake/Output Summary (Last 24 hours) at 05/16/2020 0955 Last data filed at 05/16/2020 0531 Gross per 24 hour  Intake --  Output 2400 ml  Net -2400 ml      PHYSICAL EXAM  General: Well developed, well nourished, in no acute distress HEENT:  Normocephalic and atramatic Neck:  No JVD.  Lungs: Clear bilaterally to auscultation and percussion. Heart: HRRR . Normal S1 and S2 , S3 gallops or murmurs.  Abdomen: Bowel sounds are positive, abdomen soft and non-tender  Msk:  Back normal, normal gait. Normal strength and tone for age. Extremities: No clubbing, cyanosis or edema.   Neuro: Alert and oriented X 3. Psych:  Good affect, responds appropriately   LABS: Basic Metabolic Panel: Recent Labs    05/15/20 0400 05/16/20 0545  NA 141 141  K 3.6 2.8*  CL 104 104  CO2 24 29  GLUCOSE 180* 102*  BUN 22 23  CREATININE 1.07 1.05  CALCIUM 8.5* 8.4*   Liver Function Tests: Recent Labs    05/15/20 0400  AST 23  ALT 20  ALKPHOS 63  BILITOT 0.9  PROT 7.4  ALBUMIN 4.0   No results for input(s): LIPASE, AMYLASE in the last 72 hours. CBC: Recent Labs    05/15/20 0400  WBC 8.7  NEUTROABS 6.2  HGB 13.8  HCT 42.7  MCV 92.4  PLT 239   Cardiac Enzymes: No results for input(s): CKTOTAL, CKMB, CKMBINDEX, TROPONINI in the last 72 hours. BNP: Invalid input(s): POCBNP D-Dimer: No results for input(s): DDIMER in the last 72 hours. Hemoglobin A1C: No results for input(s): HGBA1C in the last 72  hours. Fasting Lipid Panel: No results for input(s): CHOL, HDL, LDLCALC, TRIG, CHOLHDL, LDLDIRECT in the last 72 hours. Thyroid Function Tests: No results for input(s): TSH, T4TOTAL, T3FREE, THYROIDAB in the last 72 hours.  Invalid input(s): FREET3 Anemia Panel: No results for input(s): VITAMINB12, FOLATE, FERRITIN, TIBC, IRON, RETICCTPCT in the last 72 hours.  DG Chest Portable 1 View  Result Date: 05/15/2020 CLINICAL DATA:  74 year old male with sudden onset shortness of breath. EXAM: PORTABLE CHEST 1 VIEW COMPARISON:  Portable chest 03/29/2020. FINDINGS: Portable AP upright view at 0403 hours. Stable lung volumes and mediastinal contours. Mild cardiomegaly. Prior sternotomy. Similar appearance of diffuse increased pulmonary interstitial opacity, evidence of trace fluid in the right minor fissure. No pneumothorax. But there is continued veiling opacity at the left lung base although improved since last month. No air bronchograms. Visualized tracheal air column is within normal limits. No acute osseous abnormality identified. IMPRESSION: Pulmonary interstitial edema suspected. Small left pleural effusion, decreased since last month. Electronically Signed   By: 03/31/2020 M.D.   On: 05/15/2020 04:18     Echo severely depressed left ventricular function EF around 20 to 25%  TELEMETRY: Sinus bradycardia heart rate around 50-55  ASSESSMENT AND PLAN:  Principal Problem:  Acute on chronic combined systolic and diastolic CHF (congestive heart failure) (HCC) Active Problems:   Essential hypertension   CAD (coronary artery disease)   Major depressive disorder   Type 2 diabetes mellitus without complication, with long-term current use of insulin (HCC)   Acute cardiogenic pulmonary edema (HCC)   Acute on chronic combined systolic (congestive) and diastolic (congestive) heart failure (HCC) Hypokalemia Ischemic cardiomyopathy Possible obstructive sleep apnea Morbid obesity Leg  edema . Plan Continue telemetry follow-up EKGs and troponins Agree with supplemental oxygen inhalers as necessary Continue hypertension management and control Agree with Entresto for heart failure diuretics Coreg Recommend sleep study for possible obstructive sleep apnea Recommend modest weight loss exercise portion control Consider evaluation for possible AICD because of ischemic cardiomyopathy EF less than 35% Continue diabetes management and control will consider switching or adding [place] Patient feels well enough to be discharged but will have to correct electrolytes prior to discharge Outpatient follow-up with with cardiology 1 to 2 weeks      Alwyn Pea, MD 05/16/2020 9:55 AM

## 2020-05-16 NOTE — Progress Notes (Signed)
Truman Hayward to be D/C'd Home per MD order.  Discussed prescriptions and follow up appointments with the patient. Prescriptions given to patient, medication list explained in detail. Pt verbalized understanding.  Allergies as of 05/16/2020   No Known Allergies     Medication List    TAKE these medications   ascorbic acid 500 MG tablet Commonly known as: VITAMIN C Take 500 mg by mouth daily.   atorvastatin 80 MG tablet Commonly known as: LIPITOR Take 80 mg by mouth at bedtime.   carvedilol 3.125 MG tablet Commonly known as: COREG Take 1 tablet (3.125 mg total) by mouth in the morning and at bedtime. What changed:   medication strength  how much to take   clopidogrel 75 MG tablet Commonly known as: PLAVIX Take 75 mg by mouth every evening.   empagliflozin 10 MG Tabs tablet Commonly known as: JARDIANCE Take 10 mg by mouth daily.   FLUoxetine 40 MG capsule Commonly known as: PROZAC Take 40 mg by mouth daily.   furosemide 20 MG tablet Commonly known as: Lasix Take 3 tablets (60 mg total) by mouth daily. What changed:   medication strength  how much to take  additional instructions  Another medication with the same name was removed. Continue taking this medication, and follow the directions you see here.   insulin glargine 100 UNIT/ML Solostar Pen Commonly known as: LANTUS Inject 40 Units into the skin at bedtime.   metFORMIN 1000 MG tablet Commonly known as: GLUCOPHAGE Take 1,000 mg by mouth 2 (two) times daily with a meal.   Multivitamin Adults 50+ Tabs Take 1 tablet by mouth daily.   Omega-3 1000 MG Caps Take 1,000 mg by mouth every 3 (three) days.   Renal 1 MG Caps Take 1 capsule by mouth every evening.   sacubitril-valsartan 49-51 MG Commonly known as: ENTRESTO Take 1 tablet by mouth in the morning and at bedtime.   spironolactone 25 MG tablet Commonly known as: ALDACTONE Take 0.5 tablets (12.5 mg total) by mouth daily. Start taking on:  May 17, 2020       Vitals:   05/16/20 1142 05/16/20 1646  BP: 125/67 131/81  Pulse: (!) 59 (!) 52  Resp: 16 19  Temp: 97.7 F (36.5 C) 98.2 F (36.8 C)  SpO2: 94% 98%    Tele box removed and returned.Skin clean, dry and intact without evidence of skin break down, no evidence of skin tears noted. IV catheter discontinued intact. Site without signs and symptoms of complications. Dressing and pressure applied. Pt denies pain at this time. No complaints noted.  An After Visit Summary was printed and given to the patient. Patient escorted via WC, and D/C home via private auto.  Adam Ferguson

## 2020-05-19 ENCOUNTER — Ambulatory Visit: Payer: BC Managed Care – PPO | Admitting: Family

## 2020-05-25 NOTE — Progress Notes (Signed)
Patient ID: Adam Ferguson, male    DOB: 1945/06/16, 75 y.o.   MRN: [id number]  HPI  Adam Ferguson is a 75 y/o male with a history of DM, HTN, MI and chronic heart failure.   Echo report from 03/30/20 reviewed and showed an EF of 20-25% along with moderate LAE and trivial Adam.   Admitted 05/15/20 due to shortness of breath. Cardiology consult obtained. Initial oxygen sats in the 70's and was placed on bipap. Initially given IV lasix with transition to oral diuretics. Quickly weaned off bipap to room air. Discharged the next day. Admitted 03/29/20 due to acute on chronic HF. Cardiology consult obtained. Initially needed bipap due to increased work of breathing but then quickly weaned off. Initially given IV lasix with transition to oral diuretics. Discharged the following day.   He presents today for a follow-up visit with a chief complaint of minimal fatigue upon moderate exertion. He describes this as having been present for several months. He has associated head congest and cough along with this. He denies any difficulty sleeping, dizziness, abdominal distention, palpitations, pedal edema, chest pain, weight gain or shortness of breath.   Past Medical History:  Diagnosis Date  . CHF (congestive heart failure) (HCC)   . Diabetes mellitus without complication (HCC)   . Hypertension   . MI (myocardial infarction) [facility])    Past Surgical History:  Procedure Laterality Date  . 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 STENT PLACEMENT VASCULAR ([facility] HX)    . VALVE REPLACEMENT     Family History  Problem Relation Age of Onset  . Other Neg Hx    Social History   Tobacco Use  . Smoking status: Never Smoker  . Smokeless tobacco: Never Used  Substance Use Topics  . Alcohol use: Not Currently   No Known Allergies  Prior to Admission medications   Medication Sig Start Date End Date Taking? Authorizing Provider  ascorbic acid (VITAMIN C) 500 MG tablet Take 500  mg by mouth daily.   Yes [provider]  atorvastatin (LIPITOR) 80 MG tablet Take 80 mg by mouth at bedtime.   Yes [provider]  B Complex-C-Folic Acid (RENAL) 1 MG CAPS Take 1 capsule by mouth every evening.   Yes [provider]  carvedilol (COREG) 3.125 MG tablet Take 1 tablet (3.125 mg total) by mouth in the morning and at bedtime. 05/16/20 08/14/20 Yes Adam Bonds, MD  clopidogrel (PLAVIX) 75 MG tablet Take 75 mg by mouth every evening.   Yes [provider]  empagliflozin (JARDIANCE) 10 MG TABS tablet Take 10 mg by mouth daily.   Yes [provider]  FLUoxetine (PROZAC) 40 MG capsule Take 40 mg by mouth daily. 02/19/20  Yes [provider]  furosemide (LASIX) 20 MG tablet Take 3 tablets (60 mg total) by mouth daily. 05/16/20 08/14/20 Yes Adam Bonds, MD  insulin glargine (LANTUS) 100 UNIT/ML Solostar Pen Inject 40 Units into the skin at bedtime.   Yes [provider]  metFORMIN (GLUCOPHAGE) 1000 MG tablet Take 1,000 mg by mouth 2 (two) times daily with a meal.   Yes [provider]  Multiple Vitamins-Minerals (MULTIVITAMIN ADULTS 50+) TABS Take 1 tablet by mouth daily.   Yes [provider]  Omega-3 1000 MG CAPS Take 1,000 mg by mouth every 3 (three) days.   Yes [provider]  sacubitril-valsartan (ENTRESTO) 49-51 MG Take 1 tablet by mouth in the morning and  at bedtime.   Yes [provider]  spironolactone (ALDACTONE) 25 MG tablet Take 0.5 tablets (12.5 mg total) by mouth daily. Patient not taking: Reported on 05/26/2020 05/17/20 08/15/20  Adam Bonds, MD    Review of Systems  Constitutional: Positive for fatigue. Negative for appetite change.  HENT: Positive for congestion. Negative for postnasal drip and sore throat.   Eyes: Negative.   Respiratory: Positive for cough (mostly dry ). Negative for shortness of breath.   Cardiovascular: Negative for chest pain, palpitations and leg  swelling.  Gastrointestinal: Negative for abdominal distention and abdominal pain.  Endocrine: Negative.   Genitourinary: Negative.   Musculoskeletal: Negative for back pain and neck pain.  Skin: Negative.   Allergic/Immunologic: Negative.   Neurological: Negative for dizziness and light-headedness.  Hematological: Negative for adenopathy. Does not bruise/bleed easily.  Psychiatric/Behavioral: Negative for dysphoric mood and sleep disturbance (sleeping on 2 pillows). The patient is not nervous/anxious.    Vitals:   05/26/20 0933  BP: 125/69  Pulse: (!) 51  Resp: 20  SpO2: 100%  Weight: 222 lb (100.7 kg)  Height: 5\' 9"  (1.753 m)   Wt Readings from Last 3 Encounters:  05/26/20 222 lb (100.7 kg)  05/16/20 235 lb 7.2 oz (106.8 kg)  04/09/20 230 lb (104.3 kg)   Lab Results  Component Value Date   CREATININE 1.05 05/16/2020   CREATININE 1.07 05/15/2020   CREATININE 0.92 03/30/2020    Physical Exam Vitals and nursing note reviewed.  Constitutional:      Appearance: Normal appearance.  HENT:     Head: Normocephalic and atraumatic.  Cardiovascular:     Rate and Rhythm: Regular rhythm. Bradycardia present.  Pulmonary:     Effort: Pulmonary effort is normal. No respiratory distress.     Breath sounds: No wheezing or rales.  Abdominal:     General: There is no distension.     Palpations: Abdomen is soft.  Musculoskeletal:        General: No tenderness.     Cervical back: Normal range of motion and neck supple.     Right lower leg: No edema.     Left lower leg: No edema.  Skin:    General: Skin is warm and dry.  Neurological:     General: No focal deficit present.     Mental Status: He is alert and oriented to person, place, and time.  Psychiatric:        Mood and Affect: Mood normal.        Behavior: Behavior normal.        Thought Content: Thought content normal.    Assessment & Plan:  1: Chronic heart failure with reduced ejection fraction- - NYHA class II -  euvolemic today - weighing daily and says that his weight has declined; reminded to call for an overnight weight gain of >2 pounds or a weekly weight gain of >5 pounds - weight down 8 pounds from last visit here 6 weeks ago - not adding salt and he has been reading food labels for sodium content; did admit to some dietary indiscretion over the holidays that prompted him to come to the hospital - saw cardiology 04/01/2020) 04/24/20 - says that he's tried spironolactone in the past with resultant gynecomastia; has RX to try again but is going to discuss with cardiologist first - HR will not allow for titration of carvedilol - could consider titrating entresto at future visits - BNP 05/15/20 was 1469.6 - has received 3 covid  vaccines - has received his flu vaccine for this season  2: HTN- - BP looks good today - seeing PCP Sampson Goon) later today to get established - BMP 05/16/20 reviewed and showed sodium 141, potassium 4.1, creatinine 1.05 and GFR >60  3: DM- - A1c 03/29/20 was 6.6% - fasting glucose at home today was 86    Patient did not bring his medications nor a list. Each medication was verbally reviewed with the patient and he was encouraged to bring the bottles to every visit to confirm accuracy of list.  Return in 4 months or sooner for any questions/problems before then.

## 2020-05-26 ENCOUNTER — Encounter: Payer: Self-pay | Admitting: Family

## 2020-05-26 ENCOUNTER — Other Ambulatory Visit: Payer: Self-pay

## 2020-05-26 ENCOUNTER — Ambulatory Visit: Payer: BC Managed Care – PPO | Attending: Family | Admitting: Family

## 2020-05-26 VITALS — BP 125/69 | HR 51 | Resp 20 | Ht 69.0 in | Wt 222.0 lb

## 2020-05-26 DIAGNOSIS — Z79899 Other long term (current) drug therapy: Secondary | ICD-10-CM | POA: Insufficient documentation

## 2020-05-26 DIAGNOSIS — Z7902 Long term (current) use of antithrombotics/antiplatelets: Secondary | ICD-10-CM | POA: Insufficient documentation

## 2020-05-26 DIAGNOSIS — E119 Type 2 diabetes mellitus without complications: Secondary | ICD-10-CM | POA: Diagnosis not present

## 2020-05-26 DIAGNOSIS — Z951 Presence of aortocoronary bypass graft: Secondary | ICD-10-CM | POA: Diagnosis not present

## 2020-05-26 DIAGNOSIS — Z955 Presence of coronary angioplasty implant and graft: Secondary | ICD-10-CM | POA: Insufficient documentation

## 2020-05-26 DIAGNOSIS — I252 Old myocardial infarction: Secondary | ICD-10-CM | POA: Diagnosis not present

## 2020-05-26 DIAGNOSIS — Z952 Presence of prosthetic heart valve: Secondary | ICD-10-CM | POA: Insufficient documentation

## 2020-05-26 DIAGNOSIS — I10 Essential (primary) hypertension: Secondary | ICD-10-CM

## 2020-05-26 DIAGNOSIS — I110 Hypertensive heart disease with heart failure: Secondary | ICD-10-CM | POA: Insufficient documentation

## 2020-05-26 DIAGNOSIS — Z794 Long term (current) use of insulin: Secondary | ICD-10-CM | POA: Insufficient documentation

## 2020-05-26 DIAGNOSIS — I5022 Chronic systolic (congestive) heart failure: Secondary | ICD-10-CM | POA: Insufficient documentation

## 2020-05-26 NOTE — Patient Instructions (Signed)
Continue weighing daily and call for an overnight weight gain of > 2 pounds or a weekly weight gain of >5 pounds. 

## 2020-09-21 NOTE — Progress Notes (Signed)
Patient ID: Adam Ferguson, male    DOB: August 17, 1945, 75 y.o.   MRN: [id number]  HPI  Adam Ferguson is a 75 y/o male with a history of DM, HTN, MI and chronic heart failure.   Echo report from 03/30/20 reviewed and showed an EF of 20-25% along with moderate LAE and trivial Adam.   Admitted 05/15/20 due to shortness of breath. Cardiology consult obtained. Initial oxygen sats in the 70's and was placed on bipap. Initially given IV lasix with transition to oral diuretics. Quickly weaned off bipap to room air. Discharged the next day. Admitted 03/29/20 due to acute on chronic HF. Cardiology consult obtained. Initially needed bipap due to increased work of breathing but then quickly weaned off. Initially given IV lasix with transition to oral diuretics. Discharged the following day.   Adam Ferguson presents today for a follow-up visit with a chief complaint of minimal fatigue upon moderate exertion. Adam Ferguson says that this has been present for several months with varying levels of severity. Adam Ferguson has associated cough and gradual weight gain along with this. Adam Ferguson denies any difficulty sleeping, dizziness, abdominal distention, palpitations, pedal edema, chest pain or shortness of breath.   Adam Ferguson has not taken any of his medications yet today.   Adam Ferguson says that Adam Ferguson would be interested in the exercise rehab program here at the hospital to help get him on track with his exercise.   Tried spironolactone in the past with resultant gynecomastia.   Past Medical History:  Diagnosis Date  . CHF (congestive heart failure) (HCC)   . Diabetes mellitus without complication (HCC)   . Hypertension   . MI (myocardial infarction) [facility])    Past Surgical History:  Procedure Laterality Date  . 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 STENT PLACEMENT VASCULAR ([facility] HX)    . VALVE REPLACEMENT     Family History  Problem Relation Age of Onset  . Other Neg Hx    Social History   Tobacco Use  . Smoking status:  Never Smoker  . Smokeless tobacco: Never Used  Substance Use Topics  . Alcohol use: Not Currently   No Known Allergies  Prior to Admission medications   Medication Sig Start Date End Date Taking? Authorizing Provider  ascorbic acid (VITAMIN C) 500 MG tablet Take 500 mg by mouth daily.   Yes [provider]  atorvastatin (LIPITOR) 80 MG tablet Take 80 mg by mouth at bedtime.   Yes [provider]  B Complex-C-Folic Acid (RENAL) 1 MG CAPS Take 1 capsule by mouth every evening.   Yes [provider]  carvedilol (COREG) 3.125 MG tablet Take 1 tablet (3.125 mg total) by mouth in the morning and at bedtime. 05/16/20 08/14/20 Yes Narda Bonds, MD  empagliflozin (JARDIANCE) 10 MG TABS tablet Take 10 mg by mouth daily.   Yes [provider]  furosemide (LASIX) 20 MG tablet Take 3 tablets (60 mg total) by mouth daily. 05/16/20 08/14/20 Yes Narda Bonds, MD  insulin glargine (LANTUS) 100 UNIT/ML Solostar Pen Inject 40 Units into the skin at bedtime.   Yes [provider]  metFORMIN (GLUCOPHAGE) 1000 MG tablet Take 1,000 mg by mouth 2 (two) times daily with a meal.   Yes [provider]  Multiple Vitamins-Minerals (MULTIVITAMIN ADULTS 50+) TABS Take 1 tablet by mouth daily.   Yes [provider]  Omega-3 1000 MG CAPS Take 1,000 mg by mouth every 3 (three) days.   Yes [provider]  Probiotic Product (PROBIOTIC-10) CHEW Chew 1 capsule by mouth every other day.   Yes [provider]  sacubitril-valsartan (ENTRESTO) 49-51 MG Take 1 tablet by mouth in the morning and at bedtime.   Yes [provider]  clopidogrel (PLAVIX) 75 MG tablet Take 1 tablet (75 mg total) by mouth every evening. 09/22/20   Delma Freeze, FNP  FLUoxetine (PROZAC) 40 MG capsule Take 40 mg by mouth daily. Patient not taking: Reported on 09/22/2020 02/19/20   [provider]  spironolactone (ALDACTONE) 25 MG tablet Take 0.5 tablets (12.5  mg total) by mouth daily. Patient not taking: No sig reported 05/17/20 08/15/20  Narda Bonds, MD    Review of Systems  Constitutional: Positive for fatigue. Negative for appetite change.  HENT: Positive for congestion. Negative for postnasal drip and sore throat.   Eyes: Negative.   Respiratory: Positive for cough (mostly dry ). Negative for shortness of breath.   Cardiovascular: Negative for chest pain, palpitations and leg swelling.  Gastrointestinal: Negative for abdominal distention and abdominal pain.  Endocrine: Negative.   Genitourinary: Negative.   Musculoskeletal: Negative for back pain and neck pain.  Skin: Negative.   Allergic/Immunologic: Negative.   Neurological: Negative for dizziness and light-headedness.  Hematological: Negative for adenopathy. Does not bruise/bleed easily.  Psychiatric/Behavioral: Negative for dysphoric mood and sleep disturbance (sleeping on 2 pillows). The patient is not nervous/anxious.    Vitals:   09/22/20 0922  BP: 136/60  Pulse: 77  Resp: 18  SpO2: 98%  Weight: 236 lb 8 oz (107.3 kg)  Height: 5\' 9"  (1.753 m)   Wt Readings from Last 3 Encounters:  09/22/20 236 lb 8 oz (107.3 kg)  05/26/20 222 lb (100.7 kg)  05/16/20 235 lb 7.2 oz (106.8 kg)   Lab Results  Component Value Date   CREATININE 1.05 05/16/2020   CREATININE 1.07 05/15/2020   CREATININE 0.92 03/30/2020    Physical Exam Vitals and nursing note reviewed.  Constitutional:      Appearance: Normal appearance.  HENT:     Head: Normocephalic and atraumatic.  Cardiovascular:     Rate and Rhythm: Normal rate and regular rhythm.  Pulmonary:     Effort: Pulmonary effort is normal. No respiratory distress.     Breath sounds: No wheezing or rales.  Abdominal:     General: There is no distension.     Palpations: Abdomen is soft.  Musculoskeletal:        General: No tenderness.     Cervical back: Normal range of motion and neck supple.     Right lower leg: Edema (trace  pitting) present.     Left lower leg: No edema.  Skin:    General: Skin is warm and dry.  Neurological:     General: No focal deficit present.     Mental Status: Adam Ferguson is alert and oriented to person, place, and time.  Psychiatric:        Mood and Affect: Mood normal.        Behavior: Behavior normal.        Thought Content: Thought content normal.    Assessment & Plan:  1: Chronic heart failure with reduced ejection fraction- - NYHA class II - euvolemic today - weighing daily; reminded to call for an overnight weight gain of >2 pounds or a weekly weight gain of >5 pounds - weight up 14 pounds from last visit here 4 months ago - not adding salt and Adam Ferguson has  been reading food labels for sodium content - saw cardiology Gwen Pounds) 08/20/20 - on GDMT of carvedilol, jardiance & entresto - says that Adam Ferguson's tried spironolactone in the past with resultant gynecomastia - has been bradycardic in the past - BNP 05/15/20 was 1469.6 - referral sent to pulmonary rehab and brochure was provided to the patient - PharmD reconciled medications with the patient  2: HTN- - BP mildly elevated but Adam Ferguson hasn't taken any of his medications yet today - saw  PCP Sampson Goon) 05/26/20; returns next week - BMP 05/26/20 reviewed and showed sodium 141, potassium 4.2, creatinine 1.2 and GFR 59  3: DM- - A1c 03/29/20 was 6.6% - fasting glucose at home today was 125   Patient did not bring his medications nor a list. Each medication was verbally reviewed with the patient and Adam Ferguson was encouraged to bring the bottles to every visit to confirm accuracy of list.  Due to HF stability, will not make a return appointment for patient at this time. Advised patient that Adam Ferguson could call back at anytime to schedule another appointment or for any questions and Adam Ferguson was comfortable with this plan.

## 2020-09-22 ENCOUNTER — Ambulatory Visit: Payer: BC Managed Care – PPO | Attending: Family | Admitting: Family

## 2020-09-22 ENCOUNTER — Other Ambulatory Visit: Payer: Self-pay

## 2020-09-22 ENCOUNTER — Encounter: Payer: Self-pay | Admitting: Family

## 2020-09-22 VITALS — BP 136/60 | HR 77 | Resp 18 | Ht 69.0 in | Wt 236.5 lb

## 2020-09-22 DIAGNOSIS — I5022 Chronic systolic (congestive) heart failure: Secondary | ICD-10-CM | POA: Diagnosis present

## 2020-09-22 DIAGNOSIS — I252 Old myocardial infarction: Secondary | ICD-10-CM | POA: Diagnosis not present

## 2020-09-22 DIAGNOSIS — E119 Type 2 diabetes mellitus without complications: Secondary | ICD-10-CM | POA: Diagnosis not present

## 2020-09-22 DIAGNOSIS — Z79899 Other long term (current) drug therapy: Secondary | ICD-10-CM | POA: Diagnosis not present

## 2020-09-22 DIAGNOSIS — Z794 Long term (current) use of insulin: Secondary | ICD-10-CM | POA: Insufficient documentation

## 2020-09-22 DIAGNOSIS — I110 Hypertensive heart disease with heart failure: Secondary | ICD-10-CM | POA: Insufficient documentation

## 2020-09-22 DIAGNOSIS — Z952 Presence of prosthetic heart valve: Secondary | ICD-10-CM | POA: Diagnosis not present

## 2020-09-22 DIAGNOSIS — Z951 Presence of aortocoronary bypass graft: Secondary | ICD-10-CM | POA: Diagnosis not present

## 2020-09-22 DIAGNOSIS — I10 Essential (primary) hypertension: Secondary | ICD-10-CM

## 2020-09-22 MED ORDER — CLOPIDOGREL BISULFATE 75 MG PO TABS: 75.0000 mg | ORAL_TABLET | Freq: Every evening | ORAL | 3 refills | Status: AC

## 2020-09-22 NOTE — Patient Instructions (Addendum)
Continue weighing daily and call for an overnight weight gain of > 2 pounds or a weekly weight gain of >5 pounds.   Call us in the future if you'd like to schedule another appointment.    Rehab will call you regarding getting established.

## 2020-09-22 NOTE — Progress Notes (Signed)
[place] - PHARMACIST COUNSELING NOTE  ADHERENCE ASSESSMENT    Do you ever forget to take your medication? [] Yes (1) [x] No (0)  Do you ever skip doses due to side effects? [] Yes (1) [x] No (0)  Do you have trouble affording your medicines? [] Yes (1) [x] No (0)  Are you ever unable to pick up your medication due to transportation difficulties? [] Yes (1) [x] No (0)  Do you ever stop taking your medications because you don't believe they are helping? [] Yes (1) [x] No (0)  Total score _0______     Guideline-Directed Medical Therapy/Evidence Based Medicine  ACE/ARB/ARNI: [place] Corporal Blocker: carvedilol Aldosterone Antagonist: N/A Diuretic: furosemide    SUBJECTIVE  HPI:  Past Medical History:  Diagnosis Date  . CHF (congestive heart failure) ([place])   . Diabetes mellitus without complication ([place])   . Hypertension   . MI (myocardial infarction) ([place])         OBJECTIVE   Vital signs: HR 77, BP 136/60, weight (pounds) 236 ECHO: Date 03/30/20, EF 20-25%  BMP Latest Ref Rng & Units 05/16/2020 05/16/2020 05/15/2020  Glucose 70 - 99 mg/dL - 102(H) 180(H)  BUN 8 - 23 mg/dL - 23 22  Creatinine 0.61 - 1.24 mg/dL - 1.05 1.07  Sodium 135 - 145 mmol/L - 141 141  Potassium 3.5 - 5.1 mmol/L 4.1 2.8(L) 3.6  Chloride 98 - 111 mmol/L - 104 104  CO2 22 - 32 mmol/L - 29 24  Calcium 8.9 - 10.3 mg/dL - 8.4(L) 8.5(L)    ASSESSMENT 75 yo M presenting to heart failure clinic for follow-up visit. PMH includes hx of MI, HTN, diabetes, and CHF. No barriers to adherence were identified during medication reconciliation.    PLAN CHF/HTN - Continue carvedilol 3.125 mg twice daily  - Continue empagliflozin 10 mg daily  - Continue furosemide 60 mg daily  - Continue Entresto 49-51 mg twice daily  - Unable to tolerate spironolactone due to adverse gynecomastia   Hx of MI - Continue atorvastatin 80 mg daily  - Continue carvedilol 3.125 mg twice  daily  - Continue Entresto 49-51 mg twice daily  - Continue clopidogrel 75 mg daily   Diabetes - Continue atorvastatin 80 mg daily  - Continue Lantus 40 units at bedtime - Continue metformin 1000 mg twice daily  - Continue empagliflozin 10 mg daily   General Health - Continue vitamin C supplement daily  - Continue B complex and folic acid supplement daily - Continue multivitamin daily  - Continue omega-3 supplement every 3 days  - Continue probiotic daily   Time spent: 15 minutes  Benn Moulder, PharmD Pharmacy Resident  09/22/2020 11:06 AM   Current Outpatient Medications:  .  ascorbic acid (VITAMIN C) 500 MG tablet, Take 500 mg by mouth daily., Disp: , Rfl:  .  atorvastatin (LIPITOR) 80 MG tablet, Take 80 mg by mouth at bedtime., Disp: , Rfl:  .  B Complex-C-Folic Acid (RENAL) 1 MG CAPS, Take 1 capsule by mouth every evening., Disp: , Rfl:  .  carvedilol (COREG) 3.125 MG tablet, Take 1 tablet (3.125 mg total) by mouth in the morning and at bedtime., Disp: 60 tablet, Rfl: 2 .  clopidogrel (PLAVIX) 75 MG tablet, Take 75 mg by mouth every evening., Disp: , Rfl:  .  empagliflozin (JARDIANCE) 10 MG TABS tablet, Take 10 mg by mouth daily., Disp: , Rfl:  .  FLUoxetine (PROZAC) 40 MG capsule, Take 40 mg by mouth daily. (Patient not  taking: Reported on 09/22/2020), Disp: , Rfl:  .  furosemide (LASIX) 20 MG tablet, Take 3 tablets (60 mg total) by mouth daily., Disp: 270 tablet, Rfl: 0 .  insulin glargine (LANTUS) 100 UNIT/ML Solostar Pen, Inject 40 Units into the skin at bedtime., Disp: , Rfl:  .  metFORMIN (GLUCOPHAGE) 1000 MG tablet, Take 1,000 mg by mouth 2 (two) times daily with a meal., Disp: , Rfl:  .  Multiple Vitamins-Minerals (MULTIVITAMIN ADULTS 50+) TABS, Take 1 tablet by mouth daily., Disp: , Rfl:  .  Omega-3 1000 MG CAPS, Take 1,000 mg by mouth every 3 (three) days., Disp: , Rfl:  .  Probiotic Product (PROBIOTIC-10) CHEW, Chew 1 capsule by mouth every other day., Disp: ,  Rfl:  .  sacubitril-valsartan (ENTRESTO) 49-51 MG, Take 1 tablet by mouth in the morning and at bedtime., Disp: , Rfl:  .  spironolactone (ALDACTONE) 25 MG tablet, Take 0.5 tablets (12.5 mg total) by mouth daily. (Patient not taking: No sig reported), Disp: 45 tablet, Rfl: 0   COUNSELING POINTS/CLINICAL PEARLS  Carvedilol (Goal: weight less than 85 kg is 25 mg BID, weight greater than 85 kg is 50 mg BID)  Patient should avoid activities requiring coordination until drug effects are realized, as drug may cause dizziness.  This drug may cause diarrhea, nausea, vomiting, arthralgia, back pain, myalgia, headache, vision disorder, erectile dysfunction, reduced libido, or fatigue.  Instruct patient to report signs/symptoms of adverse cardiovascular effects such as hypotension (especially in elderly patients), arrhythmias, syncope, palpitations, angina, or edema.  Drug may mask symptoms of hypoglycemia. Advise diabetic patients to carefully monitor blood sugar levels.  Patient should take drug with food.  Advise patient against sudden discontinuation of drug. Entresto (Goal: 97/103 mg twice daily)  Warn male patient to avoid pregnancy during therapy and to report a pregnancy to a physician.  Advise patient to report symptomatic hypotension.  Side effects may include hyperkalemia, cough, dizziness, or renal failure. Furosemide  Drug causes sun-sensitivity. Advise patient to use sunscreen and avoid tanning beds. Patient should avoid activities requiring coordination until drug effects are realized, as drug may cause dizziness, vertigo, or blurred vision. This drug may cause hyperglycemia, hyperuricemia, constipation, diarrhea, loss of appetite, nausea, vomiting, purpuric disorder, cramps, spasticity, asthenia, headache, paresthesia, or scaling eczema. Instruct patient to report unusual bleeding/bruising or signs/symptoms of hypotension, infection, pancreatitis, or ototoxicity (tinnitus, hearing  impairment). Advise patient to report signs/symptoms of a severe skin reactions (flu-like symptoms, spreading red rash, or skin/mucous membrane blistering) or erythema multiforme. Instruct patient to eat high-potassium foods during drug therapy, as directed by healthcare professional.  Patient should not drink alcohol while taking this drug.  DRUGS TO AVOID IN HEART FAILURE  Drug or Class Mechanism  Analgesics . NSAIDs . COX-2 inhibitors . Glucocorticoids  Sodium and water retention, increased systemic vascular resistance, decreased response to diuretics   Diabetes Medications . Metformin . Thiazolidinediones o Rosiglitazone (Avandia) o Pioglitazone (Actos) . DPP4 Inhibitors o Saxagliptin (Onglyza) o Sitagliptin (Januvia)   Lactic acidosis Possible calcium channel blockade   Unknown  Antiarrhythmics . Class I  o Flecainide o Disopyramide . Class III o Sotalol . Other o Dronedarone  Negative inotrope, proarrhythmic   Proarrhythmic, beta blockade  Negative inotrope  Antihypertensives . Alpha Blockers o Doxazosin . Calcium Channel Blockers o Diltiazem o Verapamil o Nifedipine . Central Alpha Adrenergics o Moxonidine . Peripheral Vasodilators o Minoxidil  Increases renin and aldosterone  Negative inotrope    Possible sympathetic withdrawal  Unknown  Anti-infective . Itraconazole . Amphotericin B  Negative inotrope Unknown  Hematologic . Anagrelide . Cilostazol   Possible inhibition of PD IV Inhibition of PD III causing arrhythmias  Neurologic/Psychiatric . Stimulants . Anti-Seizure Drugs o Carbamazepine o Pregabalin . Antidepressants o Tricyclics o Citalopram . Parkinsons o Bromocriptine o Pergolide o Pramipexole . Antipsychotics o Clozapine . Antimigraine o Ergotamine o Methysergide . Appetite suppressants . Bipolar o Lithium  Peripheral alpha and beta agonist activity  Negative inotrope and chronotrope Calcium channel  blockade  Negative inotrope, proarrhythmic Dose-dependent QT prolongation  Excessive serotonin activity/valvular damage Excessive serotonin activity/valvular damage Unknown  IgE mediated hypersensitivy, calcium channel blockade  Excessive serotonin activity/valvular damage Excessive serotonin activity/valvular damage Valvular damage  Direct myofibrillar degeneration, adrenergic stimulation  Antimalarials . Chloroquine . Hydroxychloroquine Intracellular inhibition of lysosomal enzymes  Urologic Agents . Alpha Blockers o Doxazosin o Prazosin o Tamsulosin o Terazosin  Increased renin and aldosterone  Adapted from Page RL, et al. "Drugs That May Cause or Exacerbate Heart Failure: A Scientific Statement from the [facility]." Circulation 2016; 960:A54-U98. DOI: 10.1161/CIR.0000000000000426   MEDICATION ADHERENCES TIPS AND STRATEGIES 1. Taking medication as prescribed improves patient outcomes in heart failure (reduces hospitalizations, improves symptoms, increases survival) 2. Side effects of medications can be managed by decreasing doses, switching agents, stopping drugs, or adding additional therapy. Please let someone in the [facility] know if you have having bothersome side effects so we can modify your regimen. Do not alter your medication regimen without talking to [place].  3. Medication reminders can help patients remember to take drugs on time. If you are missing or forgetting doses you can try linking behaviors, using pill boxes, or an electronic reminder like an alarm on your phone or an app. Some people can also get automated phone calls as medication reminders.

## 2020-10-28 ENCOUNTER — Ambulatory Visit: Payer: BC Managed Care – PPO

## 2021-05-04 IMAGING — DX DG CHEST 1V PORT
1 series · 1 of 1 positions shown · non-contrast
Comparison: Portable chest 03/29/2020.

CLINICAL DATA: 74-year-old male with sudden onset shortness of
breath.

EXAM:
PORTABLE CHEST 1 VIEW

[chest ap]
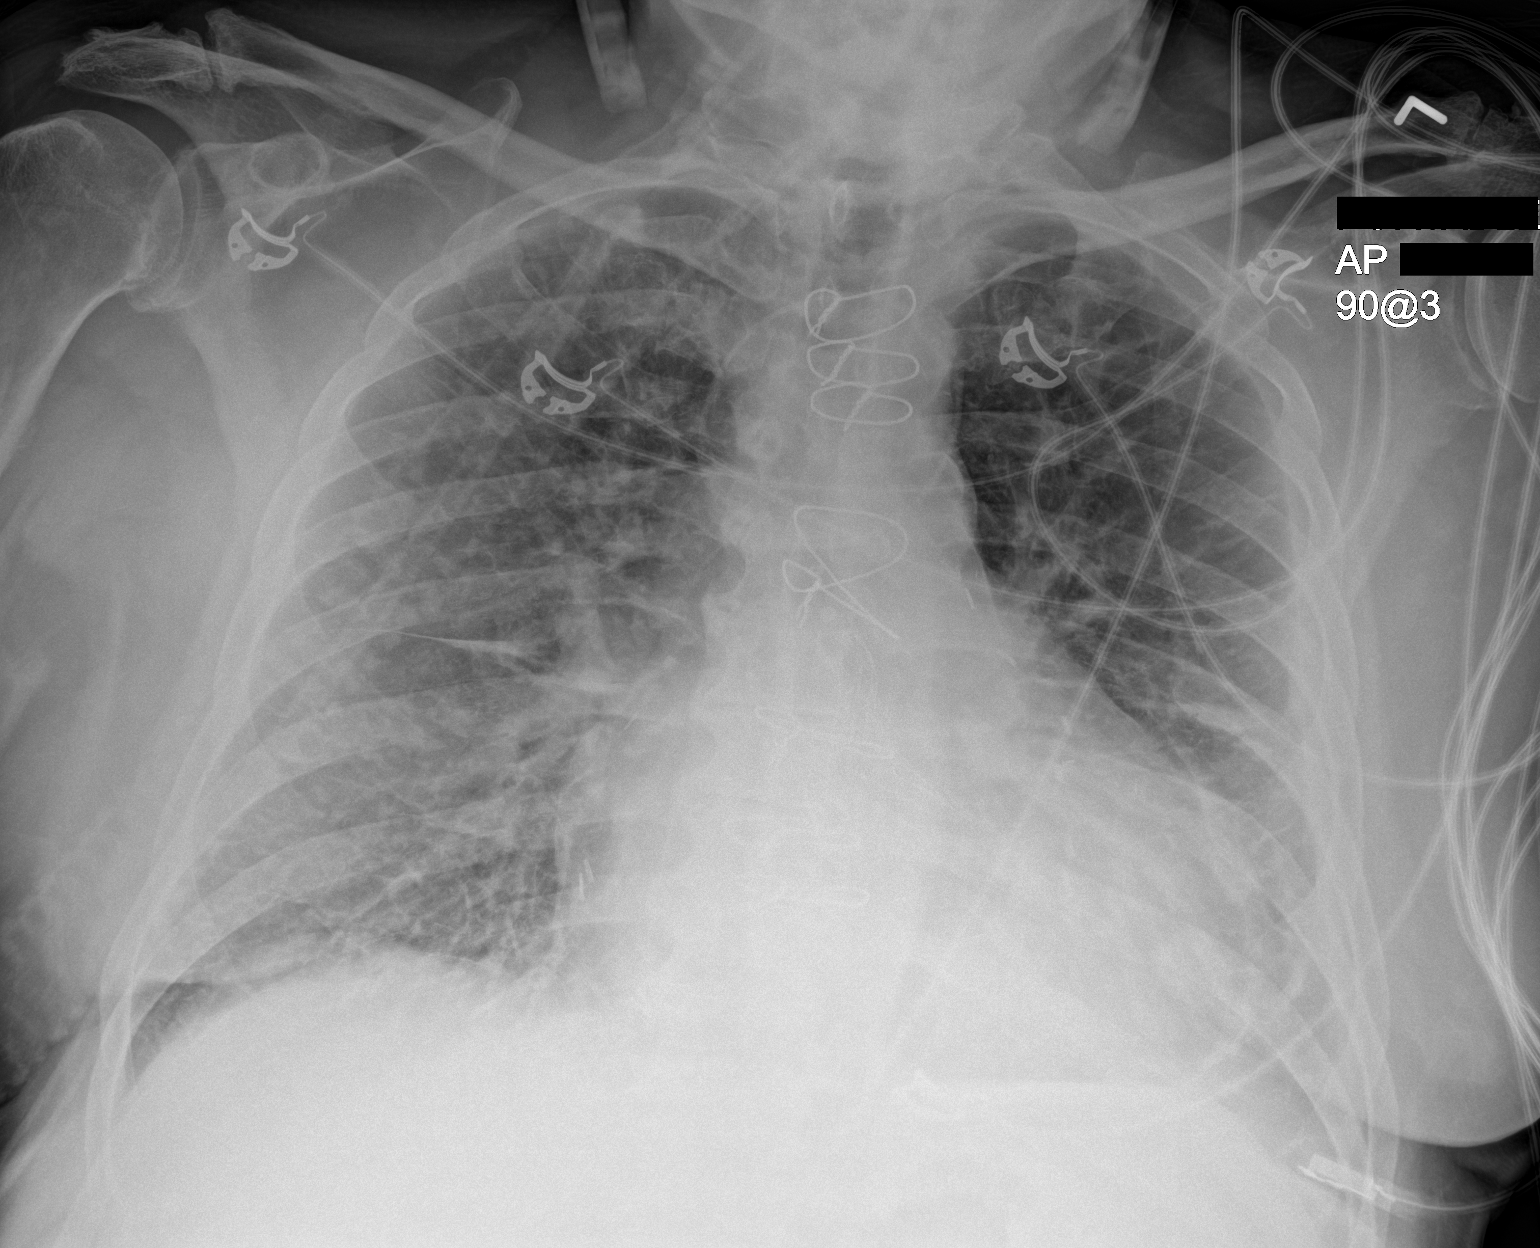

[1 of 1 positions shown; findings below may reference images not displayed]

FINDINGS: Portable AP upright view at 2020 hours. Stable lung volumes and
mediastinal contours. Mild cardiomegaly. Prior sternotomy. Similar
appearance of diffuse increased pulmonary interstitial opacity,
evidence of trace fluid in the right minor fissure. No pneumothorax.
But there is continued veiling opacity at the left lung base
although improved since last month. No air bronchograms. Visualized
tracheal air column is within normal limits. No acute osseous
abnormality identified.
IMPRESSION: Pulmonary interstitial edema suspected. Small left pleural effusion,
decreased since last month.

## 2021-07-27 ENCOUNTER — Encounter: Payer: Self-pay | Admitting: Ophthalmology

## 2021-07-29 NOTE — Discharge Instructions (Signed)

## 2021-08-03 ENCOUNTER — Other Ambulatory Visit: Payer: Self-pay

## 2021-08-03 ENCOUNTER — Ambulatory Visit: Payer: BC Managed Care – PPO | Admitting: Anesthesiology

## 2021-08-03 ENCOUNTER — Encounter
Admission: RE | Disposition: A | Discharge status: Home / Self Care | Payer: Self-pay | Source: Home / Self Care | Attending: Ophthalmology

## 2021-08-03 ENCOUNTER — Encounter: Payer: Self-pay | Admitting: Ophthalmology

## 2021-08-03 ENCOUNTER — Ambulatory Visit
Admission: RE | Admit: 2021-08-03 | Discharge: 2021-08-03 | Disposition: A | Discharge status: Home / Self Care | Payer: BC Managed Care – PPO | Attending: Ophthalmology | Admitting: Ophthalmology

## 2021-08-03 DIAGNOSIS — H2511 Age-related nuclear cataract, right eye: Secondary | ICD-10-CM | POA: Insufficient documentation

## 2021-08-03 DIAGNOSIS — H2181 Floppy iris syndrome: Secondary | ICD-10-CM | POA: Insufficient documentation

## 2021-08-03 DIAGNOSIS — Z794 Long term (current) use of insulin: Secondary | ICD-10-CM | POA: Diagnosis not present

## 2021-08-03 DIAGNOSIS — Z955 Presence of coronary angioplasty implant and graft: Secondary | ICD-10-CM | POA: Insufficient documentation

## 2021-08-03 DIAGNOSIS — E119 Type 2 diabetes mellitus without complications: Secondary | ICD-10-CM | POA: Insufficient documentation

## 2021-08-03 DIAGNOSIS — E1136 Type 2 diabetes mellitus with diabetic cataract: Secondary | ICD-10-CM | POA: Insufficient documentation

## 2021-08-03 DIAGNOSIS — I110 Hypertensive heart disease with heart failure: Secondary | ICD-10-CM | POA: Insufficient documentation

## 2021-08-03 DIAGNOSIS — Z952 Presence of prosthetic heart valve: Secondary | ICD-10-CM | POA: Insufficient documentation

## 2021-08-03 DIAGNOSIS — Z7984 Long term (current) use of oral hypoglycemic drugs: Secondary | ICD-10-CM | POA: Diagnosis not present

## 2021-08-03 DIAGNOSIS — Z951 Presence of aortocoronary bypass graft: Secondary | ICD-10-CM | POA: Insufficient documentation

## 2021-08-03 DIAGNOSIS — I509 Heart failure, unspecified: Secondary | ICD-10-CM | POA: Insufficient documentation

## 2021-08-03 HISTORY — PX: CATARACT EXTRACTION W/PHACO: SHX586

## 2021-08-03 LAB — GLUCOSE, CAPILLARY
Glucose-Capillary: 103 mg/dL — ABNORMAL HIGH (ref 70–99)
Glucose-Capillary: 95 mg/dL (ref 70–99)

## 2021-08-03 SURGERY — PHACOEMULSIFICATION, CATARACT, WITH IOL INSERTION
Anesthesia: Monitor Anesthesia Care | Site: Eye | Laterality: Right

## 2021-08-03 MED ORDER — SIGHTPATH DOSE#1 BSS IO SOLN
INTRAOCULAR | Status: DC | PRN
  Administered 2021-08-03: 15 mL

## 2021-08-03 MED ORDER — SIGHTPATH DOSE#1 BSS IO SOLN
INTRAOCULAR | Status: DC | PRN
  Administered 2021-08-03: 125 mL via OPHTHALMIC

## 2021-08-03 MED ORDER — LACTATED RINGERS IV SOLN: INTRAVENOUS | Status: DC

## 2021-08-03 MED ORDER — MOXIFLOXACIN HCL 0.5 % OP SOLN
OPHTHALMIC | Status: DC | PRN
  Administered 2021-08-03: 0.2 mL via OPHTHALMIC

## 2021-08-03 MED ORDER — ONDANSETRON HCL 4 MG/2ML IJ SOLN
INTRAMUSCULAR | Status: DC | PRN
  Administered 2021-08-03: 4 mg via INTRAVENOUS

## 2021-08-03 MED ORDER — ARMC OPHTHALMIC DILATING DROPS
1.0000 | OPHTHALMIC | Status: DC | PRN
Start: 2021-08-03 — End: 2021-08-03
  Administered 2021-08-03 (×3): 1 via OPHTHALMIC

## 2021-08-03 MED ORDER — MIDAZOLAM HCL 2 MG/2ML IJ SOLN
INTRAMUSCULAR | Status: DC | PRN
  Administered 2021-08-03: 2 mg via INTRAVENOUS

## 2021-08-03 MED ORDER — SIGHTPATH DOSE#1 SODIUM HYALURONATE 10 MG/ML IO SOLUTION
PREFILLED_SYRINGE | INTRAOCULAR | Status: DC | PRN
  Administered 2021-08-03: 0.85 mL via INTRAOCULAR

## 2021-08-03 MED ORDER — SIGHTPATH DOSE#1 SODIUM HYALURONATE 23 MG/ML IO SOLUTION
PREFILLED_SYRINGE | INTRAOCULAR | Status: DC | PRN
  Administered 2021-08-03: 0.6 mL via INTRAOCULAR

## 2021-08-03 MED ORDER — TETRACAINE HCL 0.5 % OP SOLN
1.0000 [drp] | OPHTHALMIC | Status: DC | PRN
  Administered 2021-08-03 (×3): 1 [drp] via OPHTHALMIC

## 2021-08-03 MED ORDER — LIDOCAINE HCL (PF) 2 % IJ SOLN
INTRAOCULAR | Status: DC | PRN
  Administered 2021-08-03: 1 mL via INTRAOCULAR

## 2021-08-03 MED ORDER — FENTANYL CITRATE (PF) 100 MCG/2ML IJ SOLN
INTRAMUSCULAR | Status: DC | PRN
  Administered 2021-08-03 (×2): 50 ug via INTRAVENOUS

## 2021-08-03 SURGICAL SUPPLY — 15 items
CATARACT SUITE SIGHTPATH (MISCELLANEOUS) ×2 IMPLANT
DISSECTOR HYDRO NUCLEUS 50X22 (MISCELLANEOUS) ×2 IMPLANT
FEE CATARACT SUITE SIGHTPATH (MISCELLANEOUS) ×1 IMPLANT
GLOVE SURG GAMMEX PI TX LF 7.5 (GLOVE) ×2 IMPLANT
GLOVE SURG SYN 8.5  E (GLOVE) ×1
GLOVE SURG SYN 8.5 E (GLOVE) ×1 IMPLANT
GLOVE SURG SYN 8.5 PF PI (GLOVE) ×1 IMPLANT
LENS IOL TECNIS EYHANCE 15.0 (Intraocular Lens) ×1 IMPLANT
NDL FILTER BLUNT 18X1 1/2 (NEEDLE) ×1 IMPLANT
NEEDLE FILTER BLUNT 18X 1/2SAF (NEEDLE) ×1
NEEDLE FILTER BLUNT 18X1 1/2 (NEEDLE) ×1 IMPLANT
RING MALYGIN (MISCELLANEOUS) ×1 IMPLANT
SYR 3ML LL SCALE MARK (SYRINGE) ×2 IMPLANT
SYR 5ML LL (SYRINGE) ×2 IMPLANT
WATER STERILE IRR 250ML POUR (IV SOLUTION) ×2 IMPLANT

## 2021-08-03 NOTE — H&P (Signed)
[facility]  ? ?Primary Care Physician:  Mick Sell, MD ?Ophthalmologist: Dr. Willey Blade ? ?Pre-Procedure History & Physical: ?HPI:  Adam Ferguson is a 76 y.o. male here for cataract surgery. ?  ?Past Medical History:  ?Diagnosis Date  ? CHF (congestive heart failure) ([facility])   ? Diabetes mellitus without complication ([facility])   ? Hypertension   ? MI (myocardial infarction) ([facility])   ? ? ?Past Surgical History:  ?Procedure Laterality Date  ? 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 STENT PLACEMENT VASCULAR ([facility] HX)    ? VALVE REPLACEMENT    ? ? ?Prior to Admission medications   ?Medication Sig Start Date End Date Taking? Authorizing Provider  ?ascorbic acid (VITAMIN C) 500 MG tablet Take 500 mg by mouth daily.   Yes [provider]  ?atorvastatin (LIPITOR) 80 MG tablet Take 80 mg by mouth at bedtime.   Yes [provider]  ?B Complex-C-Folic Acid (RENAL) 1 MG CAPS Take 1 capsule by mouth every evening.   Yes [provider]  ?carvedilol (COREG) 3.125 MG tablet Take 1 tablet (3.125 mg total) by mouth in the morning and at bedtime. 05/16/20 07/27/21 Yes Narda Bonds, MD  ?clopidogrel (PLAVIX) 75 MG tablet Take 1 tablet (75 mg total) by mouth every evening. 09/22/20  Yes Delma Freeze, FNP  ?empagliflozin (JARDIANCE) 10 MG TABS tablet Take 10 mg by mouth daily.   Yes [provider]  ?furosemide (LASIX) 20 MG tablet Take 3 tablets (60 mg total) by mouth daily. 05/16/20 07/27/21 Yes Narda Bonds, MD  ?insulin glargine (LANTUS) 100 UNIT/ML Solostar Pen Inject 40 Units into the skin at bedtime.   Yes [provider]  ?metFORMIN (GLUCOPHAGE) 1000 MG tablet Take 500 mg by mouth daily with breakfast.   Yes [provider]  ?Multiple Vitamins-Minerals (MULTIVITAMIN ADULTS 50+) TABS Take 1 tablet by mouth daily.   Yes [provider]  ?Omega-3 1000 MG CAPS Take 1,000 mg by mouth every 3 (three) days.   Yes  [provider]  ?Probiotic Product (PROBIOTIC-10) CHEW Chew 1 capsule by mouth every other day.   Yes [provider]  ?sacubitril-valsartan (ENTRESTO) 49-51 MG Take 1 tablet by mouth in the morning and at bedtime.   Yes [provider]  ? ? ?Allergies as of 06/02/2021  ? (No Known Allergies)  ? ? ?Family History  ?Problem Relation Age of Onset  ? Other Neg Hx   ? ? ?Social History  ? ?Socioeconomic History  ? Marital status: Married  ?  Spouse name: Not on file  ? Number of children: Not on file  ? Years of education: Not on file  ? Highest education level: Not on file  ?Occupational History  ? Not on file  ?Tobacco Use  ? Smoking status: Never  ? Smokeless tobacco: Never  ?Vaping Use  ? Vaping Use: Never used  ?Substance and Sexual Activity  ? Alcohol use: Not Currently  ? Drug use: Not Currently  ? Sexual activity: Not Currently  ?Other Topics Concern  ? Not on file  ?Social History Narrative  ? Not on file  ? ?Social Determinants of Health  ? ?Financial Resource Strain: Not on file  ?Food Insecurity: Not on file  ?Transportation Needs: Not on file  ?Physical Activity: Not on file  ?Stress: Not on file  ?Social Connections: Not on file  ?Intimate Partner Violence: Not on file  ? ? ?Review of  Systems: ?See HPI, otherwise negative ROS ? ?Physical Exam: ?BP (!) 142/68   Pulse 82   Temp 97.9 ?F (36.6 ?C) (Temporal)   Resp 20   Ht 5\' 9"  (1.753 m)   Wt 105.2 kg   SpO2 98%   BMI 34.26 kg/m?  ?General:   Alert, cooperative in NAD ?Head:  Normocephalic and atraumatic. ?Respiratory:  Normal work of breathing. ?Cardiovascular:  RRR ? ?Impression/Plan: ?Adam Ferguson is here for cataract surgery. ? ?Risks, benefits, limitations, and alternatives regarding cataract surgery have been reviewed with the patient.  Questions have been answered.  All parties agreeable. ? ? ?Truman Hayward, MD  08/03/2021, 9:46 AM ? ? ?

## 2021-08-03 NOTE — Anesthesia Preprocedure Evaluation (Signed)
Anesthesia Evaluation  ? ? ?Airway ?Mallampati: III ? ?TM Distance: >3 FB ?Neck ROM: Full ? ? ? Dental ?no notable dental hx. ? ?  ?Pulmonary ? ?  ?Pulmonary exam normal ? ? ? ? ? ? ? Cardiovascular ?hypertension, + Cardiac Stents, + CABG (3v, 2019) and +CHF  ?Normal cardiovascular exam+ Valvular Problems/Murmurs (TAVR 2019)  ? ?03/2020 Echo ? ??1. Left ventricular ejection fraction, by estimation, is 20 to 25%. The  ?left ventricle has severely decreased function. The left ventricle  ?demonstrates global hypokinesis. The left ventricular internal cavity size  ?was severely dilated. Left ventricular  ?diastolic parameters are consistent with Grade II diastolic dysfunction  ?(pseudonormalization).  ??2. Right ventricular systolic function is moderately reduced. The right  ?ventricular size is severely enlarged.  ??3. Left atrial size was moderately dilated.  ??4. Right atrial size was moderately dilated.  ??5. The mitral valve is normal in structure. Trivial mitral valve  ?regurgitation. No evidence of mitral stenosis.  ??6. The aortic valve is abnormal. Aortic valve regurgitation is not  ?visualized. Mild aortic valve sclerosis is present, with no evidence of  ?aortic valve stenosis.  ??7. The inferior vena cava is normal in size with greater than 50%  ?respiratory variability, suggesting right atrial pressure of 3 mmHg.  ? 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  ?  ?Neuro/Psych ?  ? GI/Hepatic ?  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 Renal/GU ?  ? ?  ?Musculoskeletal ? ? Abdominal ?  ?Peds ? Hematology ?  ?Anesthesia Other Findings ? ? Reproductive/Obstetrics ? ?  ? ? ? ? ? ? ? ? ? ? ? ? ? ?  ?  ? ? ? ? ? ? ? ? ?Anesthesia Physical ?Anesthesia Plan ? ?ASA: 3 ? ?Anesthesia Plan: MAC  ? ?Post-op Pain Management: Minimal or no pain anticipated  ? ?Induction: Intravenous ? ?PONV Risk Score and Plan: 1 and TIVA,  Midazolam and Treatment may vary due to age or medical condition ? ?Airway Management Planned: Nasal Cannula and Natural Airway ? ?Additional Equipment:  ? ?Intra-op Plan:  ? ?Post-operative Plan:  ? ?Informed Consent: I have reviewed the patients History and Physical, chart, labs and discussed the procedure including the risks, benefits and alternatives for the proposed anesthesia with the patient or authorized representative who has indicated his/her understanding and acceptance.  ? ? ? ? ? ?Plan Discussed with: CRNA ? ?Anesthesia Plan Comments:   ? ? ? ? ? ? ?Anesthesia Quick Evaluation ? ?

## 2021-08-03 NOTE — Op Note (Addendum)
OPERATIVE NOTE ? ?[place] ?[id number] ?08/03/2021 ? ? ?PREOPERATIVE DIAGNOSIS:  Nuclear sclerotic cataract right eye.  H25.11 ?  ?POSTOPERATIVE DIAGNOSIS:     ? Nuclear sclerotic cataract right eye.   ?Intraoperative floppy iris syndrome, right eye. ?  ?PROCEDURE:   ?CPT [phone], Complex Phacoemusification with posterior chamber intraocular lens placement of the right eye, requiring malyugin ring for dilation and stabilization of the iris. ? ?LENS:   ?Implant Name Type Inv. Item Serial No. Manufacturer Lot No. LRB No. Used Action  ?LENS IOL TECNIS EYHANCE 15.0 - [id number] Intraocular Lens LENS IOL TECNIS EYHANCE 15.0 [id number] [employer]  Right 1 Implanted  ?    ? ?Procedure(s) with comments: ?CATARACT EXTRACTION PHACO AND INTRAOCULAR LENS PLACEMENT (IOC) RIGHT DIABETIC 9.61 01:37.1 (Right) - Diabetic ? ?DIB00 +15.0 ?  ?ULTRASOUND TIME: 1 minutes 37 seconds.  CDE 9.61 ?  ?SURGEON:  Willey Blade, MD, MPH ? ?ANESTHESIOLOGIST: Anesthesiologist: Page, Wille Celeste, MD ?CRNA: Jinny Blossom, CRNA ?  ?ANESTHESIA:  Topical with tetracaine drops augmented with 1% preservative-free intracameral lidocaine. ? ?ESTIMATED BLOOD LOSS: less than 1 mL. ?  ?COMPLICATIONS:  None. ?  ?DESCRIPTION OF PROCEDURE:  The patient was identified in the holding room and transported to the operating room and placed in the supine position under the operating microscope.  The right eye was identified as the operative eye and it was prepped and draped in the usual sterile ophthalmic fashion. ?  ?A 1.0 millimeter clear-corneal paracentesis was made at the 10:30 position. 0.5 ml of preservative-free 1% lidocaine with epinephrine was injected into the anterior chamber.  The anterior chamber was filled with Healon 5 viscoelastic.   ?A 2.4 millimeter keratome was used to make a near-clear corneal incision at the 8:00 position.   ? ?The pupil was small and the iris was floppy, so a malyugin ring 6.25 mm was placed for dilation and stabilization of  the iris. ? ?A curvilinear capsulorrhexis was made with a cystotome and capsulorrhexis forceps.  Balanced salt solution was used to hydrodissect and hydrodelineate the nucleus. ?  ?Phacoemulsification was then used in stop and chop fashion to remove the lens nucleus and epinucleus.  The remaining cortex was then removed using the irrigation and aspiration handpiece. Healon was then placed into the capsular bag to distend it for lens placement.  A lens was then injected into the capsular bag.  The malyugin ring was removed.  The remaining viscoelastic was aspirated. ?  ?Wounds were hydrated with balanced salt solution.  The anterior chamber was inflated to a physiologic pressure with balanced salt solution.  ? ?Intracameral vigamox 0.1 mL undiluted was injected into the eye and a drop placed onto the ocular surface. ? ?No wound leaks were noted.  The patient was taken to the recovery room in stable condition without complications of anesthesia or surgery ? ?Willey Blade ?08/03/2021, 10:22 AM ? ?

## 2021-08-03 NOTE — Anesthesia Postprocedure Evaluation (Signed)
Anesthesia Post Note ? ?Patient: Adam Ferguson ? ?Procedure(s) Performed: CATARACT EXTRACTION PHACO AND INTRAOCULAR LENS PLACEMENT (IOC) RIGHT DIABETIC 9.61 01:37.1 (Right: Eye) ? ? ?  ?Patient location during evaluation: PACU ?Anesthesia Type: MAC ?Level of consciousness: awake and alert ?Pain management: pain level controlled ?Vital Signs Assessment: post-procedure vital signs reviewed and stable ?Respiratory status: spontaneous breathing ?Cardiovascular status: blood pressure returned to baseline ?Postop Assessment: no apparent nausea or vomiting, adequate PO intake and no headache ?Anesthetic complications: no ? ? ?No notable events documented. ? ?Adam Ferguson ? ? ? ? ? ?

## 2021-08-03 NOTE — Anesthesia Procedure Notes (Signed)
Procedure Name: [place] ?Date/Time: 08/03/2021 10:02 AM ?Performed by: Jeannene Patella, CRNA ?Pre-anesthesia Checklist: Patient identified, Emergency Drugs available, Suction available, Timeout performed and Patient being monitored ?Patient Re-evaluated:Patient Re-evaluated prior to induction ?Oxygen Delivery Method: Nasal cannula ?Placement Confirmation: positive ETCO2 ? ? ? ? ?

## 2021-08-03 NOTE — Transfer of Care (Signed)
Immediate Anesthesia Transfer of Care Note ? ?Patient: Adam Ferguson ? ?Procedure(s) Performed: CATARACT EXTRACTION PHACO AND INTRAOCULAR LENS PLACEMENT (IOC) RIGHT DIABETIC 9.61 01:37.1 (Right: Eye) ? ?Patient Location: PACU ? ?Anesthesia Type: MAC ? ?Level of Consciousness: awake, alert  and patient cooperative ? ?Airway and Oxygen Therapy: Patient Spontanous Breathing and Patient connected to supplemental oxygen ? ?Post-op Assessment: Post-op Vital signs reviewed, Patient's Cardiovascular Status Stable, Respiratory Function Stable, Patent Airway and No signs of Nausea or vomiting ? ?Post-op Vital Signs: Reviewed and stable ? ?Complications: No notable events documented. ? ?

## 2021-08-04 ENCOUNTER — Encounter: Payer: Self-pay | Admitting: Ophthalmology

## 2021-08-12 NOTE — Discharge Instructions (Signed)

## 2021-08-17 ENCOUNTER — Ambulatory Visit
Admission: RE | Admit: 2021-08-17 | Discharge: 2021-08-17 | Disposition: A | Discharge status: Home / Self Care | Payer: BC Managed Care – PPO | Attending: Ophthalmology | Admitting: Ophthalmology

## 2021-08-17 ENCOUNTER — Encounter
Admission: RE | Disposition: A | Discharge status: Home / Self Care | Payer: Self-pay | Source: Home / Self Care | Attending: Ophthalmology

## 2021-08-17 ENCOUNTER — Ambulatory Visit: Payer: BC Managed Care – PPO | Admitting: Anesthesiology

## 2021-08-17 ENCOUNTER — Other Ambulatory Visit: Payer: Self-pay

## 2021-08-17 ENCOUNTER — Encounter: Payer: Self-pay | Admitting: Ophthalmology

## 2021-08-17 DIAGNOSIS — Z951 Presence of aortocoronary bypass graft: Secondary | ICD-10-CM | POA: Diagnosis not present

## 2021-08-17 DIAGNOSIS — E1136 Type 2 diabetes mellitus with diabetic cataract: Secondary | ICD-10-CM | POA: Diagnosis not present

## 2021-08-17 DIAGNOSIS — H2181 Floppy iris syndrome: Secondary | ICD-10-CM | POA: Diagnosis not present

## 2021-08-17 DIAGNOSIS — Z955 Presence of coronary angioplasty implant and graft: Secondary | ICD-10-CM | POA: Insufficient documentation

## 2021-08-17 DIAGNOSIS — H2512 Age-related nuclear cataract, left eye: Secondary | ICD-10-CM | POA: Insufficient documentation

## 2021-08-17 DIAGNOSIS — Z794 Long term (current) use of insulin: Secondary | ICD-10-CM | POA: Insufficient documentation

## 2021-08-17 DIAGNOSIS — I110 Hypertensive heart disease with heart failure: Secondary | ICD-10-CM | POA: Insufficient documentation

## 2021-08-17 DIAGNOSIS — I509 Heart failure, unspecified: Secondary | ICD-10-CM | POA: Insufficient documentation

## 2021-08-17 HISTORY — PX: CATARACT EXTRACTION W/PHACO: SHX586

## 2021-08-17 LAB — GLUCOSE, CAPILLARY
Glucose-Capillary: 135 mg/dL — ABNORMAL HIGH (ref 70–99)
Glucose-Capillary: 135 mg/dL — ABNORMAL HIGH (ref 70–99)

## 2021-08-17 SURGERY — PHACOEMULSIFICATION, CATARACT, WITH IOL INSERTION
Anesthesia: Monitor Anesthesia Care | Site: Eye | Laterality: Left

## 2021-08-17 MED ORDER — SIGHTPATH DOSE#1 BSS IO SOLN
INTRAOCULAR | Status: DC | PRN
  Administered 2021-08-17: 86 mL via OPHTHALMIC

## 2021-08-17 MED ORDER — MIDAZOLAM HCL 2 MG/2ML IJ SOLN
INTRAMUSCULAR | Status: DC | PRN
  Administered 2021-08-17: 2 mg via INTRAVENOUS

## 2021-08-17 MED ORDER — SIGHTPATH DOSE#1 SODIUM HYALURONATE 10 MG/ML IO SOLUTION
PREFILLED_SYRINGE | INTRAOCULAR | Status: DC | PRN
  Administered 2021-08-17: 0.85 mL via INTRAOCULAR

## 2021-08-17 MED ORDER — ACETAMINOPHEN 160 MG/5ML PO SOLN: 325.0000 mg | ORAL | Status: DC | PRN

## 2021-08-17 MED ORDER — TETRACAINE HCL 0.5 % OP SOLN
1.0000 [drp] | OPHTHALMIC | Status: DC | PRN
  Administered 2021-08-17 (×3): 1 [drp] via OPHTHALMIC

## 2021-08-17 MED ORDER — MOXIFLOXACIN HCL 0.5 % OP SOLN
OPHTHALMIC | Status: DC | PRN
  Administered 2021-08-17: 0.2 mL via OPHTHALMIC

## 2021-08-17 MED ORDER — SIGHTPATH DOSE#1 SODIUM HYALURONATE 23 MG/ML IO SOLUTION
PREFILLED_SYRINGE | INTRAOCULAR | Status: DC | PRN
  Administered 2021-08-17: 0.6 mL via INTRAOCULAR

## 2021-08-17 MED ORDER — LIDOCAINE HCL (PF) 2 % IJ SOLN
INTRAOCULAR | Status: DC | PRN
  Administered 2021-08-17: 1 mL via INTRAOCULAR

## 2021-08-17 MED ORDER — ONDANSETRON HCL 4 MG/2ML IJ SOLN
4.0000 mg | Freq: Once | INTRAMUSCULAR | Status: AC | PRN
  Administered 2021-08-17: 4 mg via INTRAVENOUS

## 2021-08-17 MED ORDER — ARMC OPHTHALMIC DILATING DROPS
1.0000 "application " | OPHTHALMIC | Status: DC | PRN
  Administered 2021-08-17 (×3): 1 via OPHTHALMIC

## 2021-08-17 MED ORDER — ACETAMINOPHEN 325 MG PO TABS: 325.0000 mg | ORAL_TABLET | ORAL | Status: DC | PRN

## 2021-08-17 MED ORDER — FENTANYL CITRATE (PF) 100 MCG/2ML IJ SOLN
INTRAMUSCULAR | Status: DC | PRN
Start: 2021-08-17 — End: 2021-08-17
  Administered 2021-08-17: 100 ug via INTRAVENOUS

## 2021-08-17 MED ORDER — SIGHTPATH DOSE#1 BSS IO SOLN
INTRAOCULAR | Status: DC | PRN
  Administered 2021-08-17: 15 mL

## 2021-08-17 SURGICAL SUPPLY — 15 items
CATARACT SUITE SIGHTPATH (MISCELLANEOUS) ×2 IMPLANT
DISSECTOR HYDRO NUCLEUS 50X22 (MISCELLANEOUS) ×2 IMPLANT
FEE CATARACT SUITE SIGHTPATH (MISCELLANEOUS) ×1 IMPLANT
GLOVE SURG GAMMEX PI TX LF 7.5 (GLOVE) ×2 IMPLANT
GLOVE SURG SYN 8.5  E (GLOVE) ×1
GLOVE SURG SYN 8.5 E (GLOVE) ×1 IMPLANT
GLOVE SURG SYN 8.5 PF PI (GLOVE) ×1 IMPLANT
LENS IOL TECNIS EYHANCE 11.5 (Intraocular Lens) ×1 IMPLANT
NDL FILTER BLUNT 18X1 1/2 (NEEDLE) ×1 IMPLANT
NEEDLE FILTER BLUNT 18X 1/2SAF (NEEDLE) ×1
NEEDLE FILTER BLUNT 18X1 1/2 (NEEDLE) ×1 IMPLANT
RING MALYGIN (MISCELLANEOUS) ×1 IMPLANT
SYR 3ML LL SCALE MARK (SYRINGE) ×2 IMPLANT
SYR 5ML LL (SYRINGE) ×2 IMPLANT
WATER STERILE IRR 250ML POUR (IV SOLUTION) ×2 IMPLANT

## 2021-08-17 NOTE — Transfer of Care (Signed)
Immediate Anesthesia Transfer of Care Note ? ?Patient: Adam Ferguson ? ?Procedure(s) Performed: CATARACT EXTRACTION PHACO AND INTRAOCULAR LENS PLACEMENT (IOC) LEFT DIABETIC 3.33 00:33.1 (Left: Eye) ? ?Patient Location: PACU ? ?Anesthesia Type: MAC ? ?Level of Consciousness: awake, alert  and patient cooperative ? ?Airway and Oxygen Therapy: Patient Spontanous Breathing and Patient connected to supplemental oxygen ? ?Post-op Assessment: Post-op Vital signs reviewed, Patient's Cardiovascular Status Stable, Respiratory Function Stable, Patent Airway and No signs of Nausea or vomiting ? ?Post-op Vital Signs: Reviewed and stable ? ?Complications: No notable events documented. ? ?

## 2021-08-17 NOTE — Anesthesia Preprocedure Evaluation (Signed)
Anesthesia Evaluation  ?Patient identified by MRN, date of birth, ID band ? ?Airway ?Mallampati: III ? ?TM Distance: >3 FB ?Neck ROM: Full ? ? ? Dental ?no notable dental hx. ? ?  ?Pulmonary ? ?  ?Pulmonary exam normal ? ? ? ? ? ? ? Cardiovascular ?hypertension, + Cardiac Stents, + CABG (3v, 2019) and +CHF  ?Normal cardiovascular exam+ Valvular Problems/Murmurs (AVR CABG)  ? ?03/2020 Echo ? ??1. Left ventricular ejection fraction, by estimation, is 20 to 25%. The  ?left ventricle has severely decreased function. The left ventricle  ?demonstrates global hypokinesis. The left ventricular internal cavity size  ?was severely dilated. Left ventricular  ?diastolic parameters are consistent with Grade II diastolic dysfunction  ?(pseudonormalization).  ??2. Right ventricular systolic function is moderately reduced. The right  ?ventricular size is severely enlarged.  ??3. Left atrial size was moderately dilated.  ??4. Right atrial size was moderately dilated.  ??5. The mitral valve is normal in structure. Trivial mitral valve  ?regurgitation. No evidence of mitral stenosis.  ??6. The aortic valve is abnormal. Aortic valve regurgitation is not  ?visualized. Mild aortic valve sclerosis is present, with no evidence of  ?aortic valve stenosis.  ??7. The inferior vena cava is normal in size with greater than 50%  ?respiratory variability, suggesting right atrial pressure of 3 mmHg.  ? 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  ?  ?Neuro/Psych ?  ? GI/Hepatic ?  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 Renal/GU ?  ? ?  ?Musculoskeletal ? ? Abdominal ?  ?Peds ? Hematology ?  ?Anesthesia Other Findings ? ? Reproductive/Obstetrics ? ?  ? ? ? ? ? ? ? ? ? ? ? ? ? ?  ?  ? ? ? ? ? ? ? ? ?Anesthesia Physical ? ?Anesthesia Plan ? ?ASA: 3 ? ?Anesthesia Plan: MAC  ? ?Post-op Pain Management: Minimal or no pain anticipated  ? ?Induction:  Intravenous ? ?PONV Risk Score and Plan: 1 and TIVA, Midazolam and Treatment may vary due to age or medical condition ? ?Airway Management Planned: Nasal Cannula and Natural Airway ? ?Additional Equipment:  ? ?Intra-op Plan:  ? ?Post-operative Plan:  ? ?Informed Consent: I have reviewed the patients History and Physical, chart, labs and discussed the procedure including the risks, benefits and alternatives for the proposed anesthesia with the patient or authorized representative who has indicated his/her understanding and acceptance.  ? ? ? ? ? ?Plan Discussed with: CRNA ? ?Anesthesia Plan Comments:   ? ? ? ? ? ? ?Anesthesia Quick Evaluation ? ?

## 2021-08-17 NOTE — Anesthesia Postprocedure Evaluation (Signed)
Anesthesia Post Note ? ?Patient: Adam Ferguson ? ?Procedure(s) Performed: CATARACT EXTRACTION PHACO AND INTRAOCULAR LENS PLACEMENT (IOC) LEFT DIABETIC 3.33 00:33.1 (Left: Eye) ? ? ?  ?Patient location during evaluation: PACU ?Anesthesia Type: MAC ?Level of consciousness: awake and alert ?Pain management: pain level controlled ?Vital Signs Assessment: post-procedure vital signs reviewed and stable ?Respiratory status: spontaneous breathing, nonlabored ventilation and respiratory function stable ?Cardiovascular status: stable and blood pressure returned to baseline ?Postop Assessment: no apparent nausea or vomiting ?Anesthetic complications: no ? ? ?No notable events documented. ? ?April Manson ? ? ? ? ? ?

## 2021-08-17 NOTE — H&P (Signed)
[place]  ? ?Primary Care Physician:  Leonel Ramsay, MD ?Ophthalmologist: Dr. Benay Pillow ? ?Pre-Procedure History & Physical: ?HPI:  Adam Ferguson is a 76 y.o. male here for cataract surgery. ?  ?Past Medical History:  ?Diagnosis Date  ? CHF (congestive heart failure) ([place])   ? Diabetes mellitus without complication ([place])   ? Hypertension   ? MI (myocardial infarction) ([place])   ? ? ?Past Surgical History:  ?Procedure Laterality Date  ? 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 CATARACT EXTRACTION W/PHACO Right 08/03/2021  ? Procedure: CATARACT EXTRACTION PHACO AND INTRAOCULAR LENS PLACEMENT (IOC) RIGHT DIABETIC 9.61 01:37.1;  Surgeon: Eulogio Bear, MD;  Location: [facility];  Service: Ophthalmology;  Laterality: Right;  Diabetic  ? STENT PLACEMENT VASCULAR ([facility] H[facility])    ? VALVE REPLACEMENT    ? ? ?Prior to Admission medications   ?Medication Sig Start Date End Date Taking? Authorizing Provider  ?ascorbic acid (VITAMIN C) 500 MG tablet Take 500 mg by mouth daily.   Yes [provider]  ?atorvastatin (LIPITOR) 80 MG tablet Take 80 mg by mouth at bedtime.   Yes [provider]  ?B Complex-C-Folic Acid (RENAL) 1 MG CAPS Take 1 capsule by mouth every evening.   Yes [provider]  ?clopidogrel (PLAVIX) 75 MG tablet Take 1 tablet (75 mg total) by mouth every evening. 09/22/20  Yes Alisa Graff, FNP  ?empagliflozin (JARDIANCE) 10 MG TABS tablet Take 10 mg by mouth daily.   Yes [provider]  ?insulin glargine (LANTUS) 100 UNIT/ML Solostar Pen Inject 40 Units into the skin at bedtime.   Yes [provider]  ?metFORMIN (GLUCOPHAGE) 1000 MG tablet Take 500 mg by mouth daily with breakfast.   Yes [provider]  ?Multiple Vitamins-Minerals (MULTIVITAMIN ADULTS 50+) TABS Take 1 tablet by mouth daily.   Yes [provider]  ?Omega-3 1000 MG CAPS Take 1,000 mg by mouth every 3 (three) days.   Yes  [provider]  ?Probiotic Product (PROBIOTIC-10) CHEW Chew 1 capsule by mouth every other day.   Yes [provider]  ?sacubitril-valsartan (ENTRESTO) 49-51 MG Take 1 tablet by mouth in the morning and at bedtime.   Yes [provider]  ?carvedilol (COREG) 3.125 MG tablet Take 1 tablet (3.125 mg total) by mouth in the morning and at bedtime. 05/16/20 07/27/21  Mariel Aloe, MD  ?furosemide (LASIX) 20 MG tablet Take 3 tablets (60 mg total) by mouth daily. 05/16/20 07/27/21  Mariel Aloe, MD  ? ? ?Allergies as of 06/02/2021  ? (No Known Allergies)  ? ? ?Family History  ?Problem Relation Age of Onset  ? Other Neg Hx   ? ? ?Social History  ? ?Socioeconomic History  ? Marital status: Married  ?  Spouse name: Not on file  ? Number of children: Not on file  ? Years of education: Not on file  ? Highest education level: Not on file  ?Occupational History  ? Not on file  ?Tobacco Use  ? Smoking status: Never  ? Smokeless tobacco: Never  ?Vaping Use  ? Vaping Use: Never used  ?Substance and Sexual Activity  ? Alcohol use: Not Currently  ? Drug use: Not Currently  ? Sexual activity: Not Currently  ?Other Topics Concern  ? Not on file  ?Social History Narrative  ? Not on file  ? ?Social Determinants of Health  ? ?Financial Resource Strain: Not on file  ?  Food Insecurity: Not on file  ?Transportation Needs: Not on file  ?Physical Activity: Not on file  ?Stress: Not on file  ?Social Connections: Not on file  ?Intimate Partner Violence: Not on file  ? ? ?Review of Systems: ?See HPI, otherwise negative ROS ? ?Physical Exam: ?BP 114/70   Pulse (!) 56   Temp (!) 97.4 ?F (36.3 ?C) (Temporal)   Resp 15   Wt 104.8 kg   SpO2 95%   BMI 34.11 kg/m?  ?General:   Alert, cooperative in NAD ?Head:  Normocephalic and atraumatic. ?Respiratory:  Normal work of breathing. ?Cardiovascular:  RRR ? ?Impression/Plan: ?Jeromey Cuny is here for cataract surgery. ? ?Risks, benefits, limitations, and alternatives  regarding cataract surgery have been reviewed with the patient.  Questions have been answered.  All parties agreeable. ? ? ?Benay Pillow, MD  08/17/2021, 7:13 AM ? ? ?

## 2021-08-17 NOTE — Op Note (Signed)
OPERATIVE NOTE ? ?Adam Ferguson ?[id number] ?08/17/2021 ? ? ?PREOPERATIVE DIAGNOSIS:  Nuclear sclerotic cataract left eye.  H25.12 ?  ?POSTOPERATIVE DIAGNOSIS:     ? Nuclear sclerotic cataract left eye.   ?Intraoperative floppy iris syndrome. ?  ?PROCEDURE:  CPT [phone] Complex Phacoemusification with posterior chamber intraocular lens placement of the left eye, requiring placement of a malyugin ring for dilation and stabilization of the iris. ? ?LENS:   ?Implant Name Type Inv. Item Serial No. Manufacturer Lot No. LRB No. Used Action  ?LENS IOL TECNIS EYHANCE 11.5 - [id number] Intraocular Lens LENS IOL TECNIS EYHANCE 11.5 [id number] [employer]  Left 1 Implanted  ?    ?Procedure(s) with comments: ?CATARACT EXTRACTION PHACO AND INTRAOCULAR LENS PLACEMENT (IOC) LEFT DIABETIC 3.33 00:33.1 (Left) - Diabetic ? ?DIB00 +11.5 ?  ?ULTRASOUND TIME: 0 minutes 33 seconds.  CDE 3.33 ?  ?SURGEON:  Willey Blade, MD, MPH ?  ?ANESTHESIA:  Topical with tetracaine drops augmented with 1% preservative-free intracameral lidocaine. ? ?ESTIMATED BLOOD LOSS: <1 mL ?  ?COMPLICATIONS:  None. ?  ?DESCRIPTION OF PROCEDURE:  The patient was identified in the holding room and transported to the operating room and placed in the supine position under the operating microscope.  The left eye was identified as the operative eye and it was prepped and draped in the usual sterile ophthalmic fashion. ?  ?A 1.0 millimeter clear-corneal paracentesis was made at the 5:00 position. 0.5 ml of preservative-free 1% lidocaine with epinephrine was injected into the anterior chamber. ? The anterior chamber was filled with Healon 5 viscoelastic.  A 2.4 millimeter keratome was used to make a near-clear corneal incision at the 2:00 position.   ?The pupil was small and the iris was floppy, a malyugin ring was placed. ? ?A curvilinear capsulorrhexis was made with a cystotome and capsulorrhexis forceps.  Balanced salt solution was used to hydrodissect and hydrodelineate the  nucleus. ?  ?Phacoemulsification was then used in stop and chop fashion to remove the lens nucleus and epinucleus.  The remaining cortex was then removed using the irrigation and aspiration handpiece. Healon was then placed into the capsular bag to distend it for lens placement.  A lens was then injected into the capsular bag. ? ?The malyugin ring was removed. ? ?The remaining viscoelastic was aspirated. ?  ?Wounds were hydrated with balanced salt solution.  The anterior chamber was inflated to a physiologic pressure with balanced salt solution. ? ?Intracameral vigamox 0.1 mL undiltued was injected into the eye and a drop placed onto the ocular surface. ? No wound leaks were noted.  The patient was taken to the recovery room in stable condition without complications of anesthesia or surgery ? ?Willey Blade ?08/17/2021, 7:53 AM ? ?

## 2021-08-18 ENCOUNTER — Encounter: Payer: Self-pay | Admitting: Ophthalmology

## 2021-09-02 ENCOUNTER — Other Ambulatory Visit: Payer: Self-pay | Admitting: Family

## 2021-12-30 ENCOUNTER — Other Ambulatory Visit: Payer: Self-pay

## 2021-12-30 ENCOUNTER — Inpatient Hospital Stay
Admission: EM | Admit: 2021-12-30 | Discharge: 2022-01-01 | DRG: 281 | Disposition: A | Discharge status: Home / Self Care | Payer: BC Managed Care – PPO | Attending: Obstetrics and Gynecology | Admitting: Obstetrics and Gynecology

## 2021-12-30 ENCOUNTER — Emergency Department: Payer: BC Managed Care – PPO

## 2021-12-30 ENCOUNTER — Inpatient Hospital Stay: Payer: BC Managed Care – PPO

## 2021-12-30 ENCOUNTER — Encounter: Payer: Self-pay | Admitting: Emergency Medicine

## 2021-12-30 DIAGNOSIS — R079 Chest pain, unspecified: Secondary | ICD-10-CM | POA: Diagnosis present

## 2021-12-30 DIAGNOSIS — Z7902 Long term (current) use of antithrombotics/antiplatelets: Secondary | ICD-10-CM | POA: Diagnosis not present

## 2021-12-30 DIAGNOSIS — I5022 Chronic systolic (congestive) heart failure: Secondary | ICD-10-CM | POA: Diagnosis present

## 2021-12-30 DIAGNOSIS — Z794 Long term (current) use of insulin: Secondary | ICD-10-CM

## 2021-12-30 DIAGNOSIS — I2510 Atherosclerotic heart disease of native coronary artery without angina pectoris: Secondary | ICD-10-CM | POA: Diagnosis present

## 2021-12-30 DIAGNOSIS — Z951 Presence of aortocoronary bypass graft: Secondary | ICD-10-CM | POA: Diagnosis not present

## 2021-12-30 DIAGNOSIS — F32A Depression, unspecified: Secondary | ICD-10-CM | POA: Diagnosis present

## 2021-12-30 DIAGNOSIS — Z953 Presence of xenogenic heart valve: Secondary | ICD-10-CM | POA: Diagnosis not present

## 2021-12-30 DIAGNOSIS — I255 Ischemic cardiomyopathy: Secondary | ICD-10-CM | POA: Diagnosis present

## 2021-12-30 DIAGNOSIS — R0789 Other chest pain: Principal | ICD-10-CM

## 2021-12-30 DIAGNOSIS — Z79899 Other long term (current) drug therapy: Secondary | ICD-10-CM | POA: Diagnosis not present

## 2021-12-30 DIAGNOSIS — Z7984 Long term (current) use of oral hypoglycemic drugs: Secondary | ICD-10-CM

## 2021-12-30 DIAGNOSIS — I10 Essential (primary) hypertension: Secondary | ICD-10-CM | POA: Diagnosis present

## 2021-12-30 DIAGNOSIS — Z9861 Coronary angioplasty status: Secondary | ICD-10-CM | POA: Diagnosis not present

## 2021-12-30 DIAGNOSIS — E785 Hyperlipidemia, unspecified: Secondary | ICD-10-CM | POA: Diagnosis present

## 2021-12-30 DIAGNOSIS — I252 Old myocardial infarction: Secondary | ICD-10-CM

## 2021-12-30 DIAGNOSIS — Z952 Presence of prosthetic heart valve: Secondary | ICD-10-CM

## 2021-12-30 DIAGNOSIS — E119 Type 2 diabetes mellitus without complications: Secondary | ICD-10-CM | POA: Diagnosis present

## 2021-12-30 DIAGNOSIS — I214 Non-ST elevation (NSTEMI) myocardial infarction: Principal | ICD-10-CM | POA: Diagnosis present

## 2021-12-30 DIAGNOSIS — I110 Hypertensive heart disease with heart failure: Secondary | ICD-10-CM | POA: Diagnosis present

## 2021-12-30 DIAGNOSIS — E669 Obesity, unspecified: Secondary | ICD-10-CM | POA: Diagnosis present

## 2021-12-30 DIAGNOSIS — Z95828 Presence of other vascular implants and grafts: Secondary | ICD-10-CM | POA: Diagnosis not present

## 2021-12-30 LAB — CBC
HCT: 48.5 % (ref 39.0–52.0)
Hemoglobin: 16.4 g/dL (ref 13.0–17.0)
MCH: 31.6 pg (ref 26.0–34.0)
MCHC: 33.8 g/dL (ref 30.0–36.0)
MCV: 93.4 fL (ref 80.0–100.0)
Platelets: 211 10*3/uL (ref 150–400)
RBC: 5.19 MIL/uL (ref 4.22–5.81)
RDW: 12.7 % (ref 11.5–15.5)
WBC: 5.6 10*3/uL (ref 4.0–10.5)
nRBC: 0 % (ref 0.0–0.2)

## 2021-12-30 LAB — TROPONIN I (HIGH SENSITIVITY)
Troponin I (High Sensitivity): 11 ng/L (ref <18)
Troponin I (High Sensitivity): 13 ng/L (ref <18)
Troponin I (High Sensitivity): 34 ng/L — ABNORMAL HIGH (ref <18)
Troponin I (High Sensitivity): 124 ng/L (ref <18)

## 2021-12-30 LAB — D-DIMER, QUANTITATIVE: D-Dimer, Quant: 0.64 ug/mL-FEU — ABNORMAL HIGH (ref 0.00–0.50)

## 2021-12-30 LAB — BASIC METABOLIC PANEL
Anion gap: 11 (ref 5–15)
BUN: 19 mg/dL (ref 8–23)
CO2: 25 mmol/L (ref 22–32)
Calcium: 8.9 mg/dL (ref 8.9–10.3)
Chloride: 103 mmol/L (ref 98–111)
Creatinine, Ser: 1.19 mg/dL (ref 0.61–1.24)
GFR, Estimated: >60 mL/min (ref >60)
Glucose, Bld: 195 mg/dL — ABNORMAL HIGH (ref 70–99)
Potassium: 4.2 mmol/L (ref 3.5–5.1)
Sodium: 139 mmol/L (ref 135–145)

## 2021-12-30 LAB — CBG MONITORING, ED: Glucose-Capillary: 171 mg/dL — ABNORMAL HIGH (ref 70–99)

## 2021-12-30 MED ORDER — ATORVASTATIN CALCIUM 80 MG PO TABS
80.0000 mg | ORAL_TABLET | Freq: Every day | ORAL | Status: DC
  Administered 2021-12-30 – 2021-12-31 (×2): 80 mg via ORAL
  Filled 2021-12-30 (×2): qty 1

## 2021-12-30 MED ORDER — SACUBITRIL-VALSARTAN 49-51 MG PO TABS
1.0000 | ORAL_TABLET | Freq: Two times a day (BID) | ORAL | Status: DC
  Administered 2021-12-30: 1 via ORAL
  Filled 2021-12-30 (×2): qty 1

## 2021-12-30 MED ORDER — INSULIN GLARGINE-YFGN 100 UNIT/ML ~~LOC~~ SOLN
40.0000 [IU] | Freq: Every day | SUBCUTANEOUS | Status: DC
  Administered 2021-12-31 – 2022-01-01 (×2): 40 [IU] via SUBCUTANEOUS
  Filled 2021-12-30 (×2): qty 0.4

## 2021-12-30 MED ORDER — EMPAGLIFLOZIN 25 MG PO TABS
25.0000 mg | ORAL_TABLET | Freq: Every day | ORAL | Status: DC
  Administered 2021-12-31 – 2022-01-01 (×2): 25 mg via ORAL
  Filled 2021-12-30 (×2): qty 1

## 2021-12-30 MED ORDER — ALUM & MAG HYDROXIDE-SIMETH 200-200-20 MG/5ML PO SUSP
30.0000 mL | Freq: Once | ORAL | Status: AC
  Administered 2021-12-30: 30 mL via ORAL
  Filled 2021-12-30: qty 30

## 2021-12-30 MED ORDER — HEPARIN SODIUM (PORCINE) 5000 UNIT/ML IJ SOLN
5000.0000 [IU] | Freq: Three times a day (TID) | INTRAMUSCULAR | Status: DC
  Administered 2021-12-30: 5000 [IU] via SUBCUTANEOUS
  Filled 2021-12-30: qty 1

## 2021-12-30 MED ORDER — FUROSEMIDE 40 MG PO TABS
60.0000 mg | ORAL_TABLET | Freq: Every day | ORAL | Status: DC
  Administered 2021-12-31 – 2022-01-01 (×2): 60 mg via ORAL
  Filled 2021-12-30 (×2): qty 1

## 2021-12-30 MED ORDER — INSULIN ASPART 100 UNIT/ML IJ SOLN
0.0000 [IU] | Freq: Three times a day (TID) | INTRAMUSCULAR | Status: DC
  Administered 2021-12-31 (×2): 1 [IU] via SUBCUTANEOUS
  Administered 2021-12-31: 2 [IU] via SUBCUTANEOUS
  Filled 2021-12-30 (×3): qty 1

## 2021-12-30 MED ORDER — CARVEDILOL 3.125 MG PO TABS
3.1250 mg | ORAL_TABLET | Freq: Two times a day (BID) | ORAL | Status: DC
  Administered 2022-01-01: 3.125 mg via ORAL
  Filled 2021-12-30: qty 1

## 2021-12-30 MED ORDER — ONDANSETRON HCL 4 MG/2ML IJ SOLN: 4.0000 mg | Freq: Four times a day (QID) | INTRAMUSCULAR | Status: DC | PRN

## 2021-12-30 MED ORDER — ACETAMINOPHEN 325 MG PO TABS: 650.0000 mg | ORAL_TABLET | ORAL | Status: DC | PRN

## 2021-12-30 MED ORDER — HEPARIN (PORCINE) 25000 UT/250ML-% IV SOLN
1300.0000 [IU]/h | INTRAVENOUS | Status: DC
  Administered 2021-12-31 (×2): 1300 [IU]/h via INTRAVENOUS
  Filled 2021-12-30 (×2): qty 250

## 2021-12-30 MED ORDER — IOHEXOL 350 MG/ML SOLN
75.0000 mL | Freq: Once | INTRAVENOUS | Status: AC | PRN
  Administered 2021-12-30: 75 mL via INTRAVENOUS

## 2021-12-30 NOTE — ED Triage Notes (Signed)
Patient wheeled over from [facility] for chest pain onset of about 45 mins ago. Patient states he was sitting at his computer when pain started. Drove himself here and walked into clinic. Mild shortness of breath. Pain is non radiating.

## 2021-12-30 NOTE — Progress Notes (Signed)
ANTICOAGULATION CONSULT NOTE - Initial Consult  Pharmacy Consult for Heparin Indication: chest pain/ACS  No Known Allergies  Patient Measurements: Height: 5\' 9"  (175.3 cm) Weight: 104.3 kg (230 lb) IBW/kg (Calculated) : 70.7 Heparin Dosing Weight:  93.2 kg   Vital Signs: Temp: 97.5 F (36.4 C) (08/16 2152) Temp Source: Oral (08/16 2152) BP: 135/52 (08/16 2152) Pulse Rate: 54 (08/16 2152)  Labs: Recent Labs    12/30/21 1506 12/30/21 1712 12/30/21 1949 12/30/21 2158  HGB 16.4  --   --   --   HCT 48.5  --   --   --   PLT 211  --   --   --   CREATININE 1.19  --   --   --   TROPONINIHS 11 13 34* 124*    Estimated Creatinine Clearance: 63.8 mL/min (by C-G formula based on SCr of 1.19 mg/dL).   Medical History: Past Medical History:  Diagnosis Date   CHF (congestive heart failure) ([facility])    Diabetes mellitus without complication ([facility])    Hypertension    MI (myocardial infarction) ([facility])     Medications:  Medications Prior to Admission  Medication Sig Dispense Refill Last Dose   ascorbic acid (VITAMIN C) 500 MG tablet Take 500 mg by mouth daily.   12/30/2021 at 0830   atorvastatin (LIPITOR) 80 MG tablet Take 80 mg by mouth at bedtime.   12/29/2021 at 1700   B Complex-C-Folic Acid (RENAL) 1 MG CAPS Take 1 capsule by mouth every evening.   12/29/2021 at 1700   carvedilol (COREG) 3.125 MG tablet Take 1 tablet (3.125 mg total) by mouth in the morning and at bedtime. 60 tablet 2 12/30/2021 at 0830   clopidogrel (PLAVIX) 75 MG tablet Take 1 tablet (75 mg total) by mouth every evening. 90 tablet 3 12/29/2021 at 1700   furosemide (LASIX) 20 MG tablet Take 3 tablets (60 mg total) by mouth daily. (Patient taking differently: Take 20 mg by mouth daily. Take along with one 40 mg tablet for total 60 mg dose once daily) 270 tablet 0 12/30/2021 at 0830   furosemide (LASIX) 40 MG tablet Take 40 mg by mouth daily. Take along with one 20 mg tablet for total 60 mg dose once daily   12/30/2021 at  0830   insulin glargine (LANTUS) 100 UNIT/ML Solostar Pen Inject 40 Units into the skin at bedtime.   12/30/2021 at 0200   JARDIANCE 25 MG TABS tablet Take 25 mg by mouth daily.   12/30/2021 at 0830   metFORMIN (GLUCOPHAGE) 500 MG tablet Take 500 mg by mouth 2 (two) times daily.   12/30/2021 at 0830   Multiple Vitamins-Minerals (MULTIVITAMIN ADULTS 50+) TABS Take 1 tablet by mouth daily.   12/29/2021 at 1700   Omega-3 1000 MG CAPS Take 1,000 mg by mouth every 3 (three) days.   Past Week   Probiotic Product (PROBIOTIC-10) CHEW Chew 1 capsule by mouth every other day.   Past Week   sacubitril-valsartan (ENTRESTO) 49-51 MG Take 1 tablet by mouth in the morning and at bedtime.   12/30/2021 at 0830   Semaglutide, 1 MG/DOSE, (OZEMPIC, 1 MG/DOSE,) 4 MG/3ML SOPN Inject 1 mg into the skin once a week. Sunday   12/27/2021    Assessment: Pharmacy consulted to dose heparin in this 76 year old male admitted with ACS/NSTEMI.  Pt was on heparin 5000 units SQ Q8H , last dose on 8/16 @ 2247.  CrCl = 63.8 ml/min  Goal of Therapy:  Heparin level 0.3-0.7 units/ml Monitor platelets by anticoagulation protocol: Yes   Plan:  Heparin 5000 units SQ given on 8/16 @ 2247.  Start heparin infusion at 1300 units/hr Check anti-Xa level in 8 hours and daily while on heparin Continue to monitor H&H and platelets  Oprah Camarena D 12/30/2021,11:30 PM

## 2021-12-30 NOTE — Progress Notes (Signed)
Ce , 13, 34 ,124  D-imer also elevated   Placed on Heparin  Cardiology consulted  Dr Cassie Freer To be complete will f/u with [facility]pa  Patient current chest pain free

## 2021-12-30 NOTE — ED Provider Notes (Signed)
[facility] Provider Note  Patient Contact: 5:06 PM (approximate)   History   Chest Pain   HPI  Adam Ferguson is a 76 y.o. male who presents the emergency department complaining of substernal chest pain that began today.  Patient has a rather complicated heart history having had triple bypass in 2019, TAVR in 2019.  Also has a history of diabetes and hypertension.  Patient has been doing well over the last year without symptoms.  He states that today he was sitting at his desk when he started to have substernal chest pain.  He rates it a 4 out of 10.  States that it is still present.  No history of GERD.  Nothing   Patient had an outpatient visit with his cardiologist on 10/29/2021.  It was noted that patient had a reassuring echo from 10/2020.  Patient was being seen for 32-month follow-up.  In June, there is no concerning signs or symptoms at the time.  Patient has had discussions with cardiology regarding the small need for ICD placement but patient has wished to defer this according to the notes.  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  History of diabetes, hypertension     Physical Exam   Triage Vital Signs: ED Triage Vitals  Enc Vitals Group     BP 12/30/21 1457 (!) 157/68     Pulse Rate 12/30/21 1457 69     Resp 12/30/21 1457 17     Temp 12/30/21 1458 97.9 F (36.6 C)     Temp Source 12/30/21 1458 Oral     SpO2 12/30/21 1457 94 %     Weight 12/30/21 1457 230 lb (104.3 kg)     Height 12/30/21 1457 5\' 9"  (1.753 m)     Head Circumference --      Peak Flow --      Pain Score 12/30/21 1457 2     Pain Loc --      Pain Edu? --      Excl. in GC? --     Most recent vital signs: Vitals:   12/30/21 1458 12/30/21 1730  BP:  135/74  Pulse:  73  Resp:  16  Temp: 97.9 F (36.6 C) 98.7 F (37.1 C)  SpO2:  97%     General: Alert and in no acute distress.  Cardiovascular:  Good  peripheral perfusion.  Heart rate within normal limits.  Patient with no appreciable gallops or rubs. Respiratory: Normal respiratory effort without tachypnea or retractions. Lungs CTAB. Good air entry to the bases with no decreased or absent breath sounds. Gastrointestinal: Bowel sounds 4 quadrants. Soft and nontender to palpation. No guarding or rigidity. No palpable masses. No distention.  Musculoskeletal: Full range of motion to all extremities.  Neurologic:  No gross focal neurologic deficits are appreciated.  Skin:   No rash noted Other:   ED Results / Procedures / Treatments   Labs (all labs ordered are listed, but only abnormal results are displayed) Labs Reviewed  BASIC METABOLIC PANEL - Abnormal; Notable for the following components:      Result Value   Glucose, Bld 195 (*)    All other components within normal limits  CBG MONITORING, ED - Abnormal; Notable for the following components:   Glucose-Capillary 171 (*)    All other components within normal limits  CBC  TROPONIN I (HIGH SENSITIVITY)  TROPONIN I (HIGH SENSITIVITY)  EKG  ED ECG REPORT I, Delorise Royals Analuisa Tudor,  personally viewed and interpreted this ECG.   Date: 12/30/2021  EKG Time: 1457 hrs.  Rate: 70 bpm  Rhythm: unchanged from previous tracings, normal sinus rhythm was sinus arrhythmia, left axis deviation, compared to previous EKG from 03/29/2020 no gross significant changes  Axis: Left axis  Intervals:none  ST&T Change: No ST elevation or depression noted  Normal sinus rhythm with sinus arrhythmia.  No STEMI.  Compared to previous EKG no significant changes.    RADIOLOGY  I personally viewed, evaluated, and interpreted these images as part of my medical decision making, as well as reviewing the written report by the radiologist.  ED Provider Interpretation: No gross acute cardiopulmonary findings on chest x-ray.  Patient has postsurgical changes from CABG and TAVR  DG Chest 2  View  Result Date: 12/30/2021 CLINICAL DATA:  Chest pain. EXAM: CHEST - 2 VIEW COMPARISON:  Chest XRs, most recently 05/15/2020. FINDINGS: Borderline enlargement of the cardiac silhouette. Aortic valve replacement and arch calcifications. Coronary bypass changes. Lungs are mildly hypoinflated. No focal consolidation or mass. No pleural effusion or pneumothorax. Median sternotomy. No acute osseous abnormality. IMPRESSION: 1. No acute cardiopulmonary process 2. Borderline cardiomegaly and postsurgical changes of AVR and CABG. Aortic Atherosclerosis (ICD10-I70.0). Electronically Signed   By: Roanna Banning M.D.   On: 12/30/2021 15:49    PROCEDURES:  Critical Care performed: No  Procedures   MEDICATIONS ORDERED IN ED: Medications - No data to display   IMPRESSION / MDM / ASSESSMENT AND PLAN / ED COURSE  I reviewed the triage vital signs and the nursing notes.                              Differential diagnosis includes, but is not limited to, STEMI, NSTEMI, arrhythmia, GERD, costochondritis, pneumonia, bronchitis  Patient's presentation is most consistent with acute presentation with potential threat to life or bodily function.   Patient's diagnosis is consistent with nonspecific chest pain.  Patient arrives to the emergency department sudden onset of chest pain.  Patient has a significant cardiac history with a triple bypass as well as TAVR in 2019.  Patient has been doing well over the last year.  He states that today he had sudden onset of substernal chest pain that is still present at this time.  Not reproducible on physical exam.  Patient's EKG, troponins, labs are reassuring at this time.  No acute findings on chest x-ray.  At this time given the patient's history and ongoing chest pain I do recommend patient be admitted for observation.. Patient is given ED precautions to return to the ED for any worsening or new symptoms.        FINAL CLINICAL IMPRESSION(S) / ED DIAGNOSES   Final  diagnoses:  Atypical chest pain     Rx / DC Orders   ED Discharge Orders     None        Note:  This document was prepared using Dragon voice recognition software and may include unintentional dictation errors.   Lanette Hampshire 12/30/21 1903    Sharman Cheek, MD 12/31/21 [phone]

## 2021-12-30 NOTE — H&P (Signed)
History and Physical    Adam Ferguson HKV:[id number] DOB: 20-Jan-1946 DOA: 12/30/2021  PCP: Mick Sell, MD  Patient coming from:  [facility] [place] [facility] I have personally briefly reviewed patient's old medical records in [facility]  Chief Complaint: chest pain  HPI: Adam Ferguson is a 76 y.o. 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 Patient  states pain started after he finished eating. He states he was sitting at his desk and noted pain was substernal and felt like a soreness.  He notes no radiation, n/v/diaphoresis , no presyncope , but did note mild sob. He notes pain has resolved.  He notes currently  he feels fatigued.  He currently denies history of GERD.  He states having chest discomfort is unusual for him.      ED Course:  Vitals: afeb, bp 157/68, hr 69, rr 17 sat 94% on ra   Ekg: snr, LAFB Labs: wbc 5.6, hgb 16.4, plt 211 BMP 139, K 4.2  cr 1.19 around base CE 11,13  Cxr: NAd  Review of Systems: As per HPI otherwise 10 point review of systems negative.   Past Medical History:  Diagnosis Date   CHF (congestive heart failure) (HCC)    Diabetes mellitus without complication (HCC)    Hypertension    MI (myocardial infarction) (HCC)     Past Surgical History:  Procedure Laterality Date   AORTIC VALVE REPLACEMENT (AVR)/CORONARY ARTERY BYPASS GRAFTING (CABG)     CATARACT EXTRACTION W/PHACO Right 08/03/2021   Procedure: CATARACT EXTRACTION PHACO AND INTRAOCULAR LENS PLACEMENT (IOC) RIGHT DIABETIC 9.61 01:37.1;  Surgeon: Nevada Crane, MD;  Location: [facility];  Service: Ophthalmology;  Laterality: Right;  Diabetic   CATARACT EXTRACTION W/PHACO Left 08/17/2021   Procedure: CATARACT EXTRACTION PHACO AND INTRAOCULAR LENS PLACEMENT (IOC) LEFT DIABETIC 3.33  00:33.1;  Surgeon: Nevada Crane, MD;  Location: [facility];  Service: Ophthalmology;  Laterality: Left;  Diabetic   STENT PLACEMENT VASCULAR ([facility] HX)     VALVE REPLACEMENT       reports that he has never smoked. He has never used smokeless tobacco. He reports that he does not currently use alcohol. He reports that he does not currently use drugs.  No Known Allergies  Family History  Problem Relation Age of Onset   Other Neg Hx     Prior to Admission medications   Medication Sig Start Date End Date Taking? Authorizing Provider  ascorbic acid (VITAMIN C) 500 MG tablet Take 500 mg by mouth daily.    [provider]  atorvastatin (LIPITOR) 80 MG tablet Take 80 mg by mouth at bedtime.    [provider]  B Complex-C-Folic Acid (RENAL) 1 MG CAPS Take 1 capsule by mouth every evening.    [provider]  carvedilol (COREG) 3.125 MG tablet Take 1 tablet (3.125 mg total) by mouth in the morning and at bedtime. 05/16/20 07/27/21  Narda Bonds, MD  clopidogrel (PLAVIX) 75 MG tablet Take 1 tablet (75 mg total) by mouth every evening. 09/22/20   Delma Freeze, FNP  empagliflozin (JARDIANCE) 10 MG TABS tablet Take 10 mg by mouth daily.    [provider]  furosemide (LASIX) 20 MG tablet Take 3 tablets (60 mg total) by mouth daily. 05/16/20 07/27/21  Narda Bonds, MD  insulin glargine (LANTUS) 100 UNIT/ML Solostar Pen Inject 40 Units into the skin at bedtime.    [provider]  metFORMIN (GLUCOPHAGE) 1000 MG tablet Take 500 mg by mouth daily with breakfast.    [provider]  Multiple Vitamins-Minerals (MULTIVITAMIN ADULTS 50+) TABS Take 1 tablet by mouth daily.    [provider]  Omega-3 1000 MG CAPS Take 1,000 mg by mouth every 3 (three) days.    [provider]  Probiotic Product (PROBIOTIC-10) CHEW Chew 1 capsule by mouth every other day.    [provider]  sacubitril-valsartan (ENTRESTO) 49-51  MG Take 1 tablet by mouth in the morning and at bedtime.    [provider]    Physical Exam: Vitals:   12/30/21 1457 12/30/21 1458 12/30/21 1730  BP: (!) 157/68  135/74  Pulse: 69  73  Resp: 17  16  Temp:  97.9 F (36.6 C) 98.7 F (37.1 C)  TempSrc:  Oral Oral  SpO2: 94%  97%  Weight: 104.3 kg  104.3 kg  Height: 5\' 9"  (1.753 m)  5\' 9"  (1.753 m)     Vitals:   12/30/21 1457 12/30/21 1458 12/30/21 1730  BP: (!) 157/68  135/74  Pulse: 69  73  Resp: 17  16  Temp:  97.9 F (36.6 C) 98.7 F (37.1 C)  TempSrc:  Oral Oral  SpO2: 94%  97%  Weight: 104.3 kg  104.3 kg  Height: 5\' 9"  (1.753 m)  5\' 9"  (1.753 m)  Constitutional: NAD, calm, comfortable Eyes: PERRL, lids and conjunctivae normal ENMT: Mucous membranes are moist. Posterior pharynx clear of any exudate or lesions.Normal dentition.  Neck: normal, supple, no masses, no thyromegaly Respiratory: clear to auscultation bilaterally, no wheezing, no crackles. Normal respiratory effort. No accessory muscle use.  Cardiovascular: Regular rate and rhythm, no murmurs / rubs / gallops. No extremity edema. 2+ pedal pulses. No carotid bruits.  Abdomen: no tenderness, no masses palpated. No hepatosplenomegaly. Bowel sounds positive.  Musculoskeletal: no clubbing / cyanosis. No joint deformity upper and lower extremities. Good ROM, no contractures. Normal muscle tone.  Skin: no rashes, lesions, ulcers. No induration Neurologic: CN 2-12 grossly intact. Sensation intact, DTR normal. Strength 5/5 in all 4.  Psychiatric: Normal judgment and insight. Alert and oriented x 3. Normal mood.    Labs on Admission: I have personally reviewed following labs and imaging studies  CBC: Recent Labs  Lab 12/30/21 1506  WBC 5.6  HGB 16.4  HCT 48.5  MCV 93.4  PLT 211   Basic Metabolic Panel: Recent Labs  Lab 12/30/21 1506  NA 139  K 4.2  CL 103  CO2 25  GLUCOSE 195*  BUN 19  CREATININE 1.19  CALCIUM 8.9   GFR: Estimated  Creatinine Clearance: 63.8 mL/min (by C-G formula based on SCr of 1.19 mg/dL). Liver Function Tests: No results for input(s): "AST", "ALT", "ALKPHOS", "BILITOT", "PROT", "ALBUMIN" in the last 168 hours. No results for input(s): "LIPASE", "AMYLASE" in the last 168 hours. No results for input(s): "AMMONIA" in the last 168 hours. Coagulation Profile: No results for input(s): "INR", "PROTIME" in the last 168 hours. Cardiac Enzymes: No results for input(s): "CKTOTAL", "CKMB", "CKMBINDEX", "TROPONINI" in the last 168 hours. BNP (last 3 results) No results for input(s): "PROBNP" in the last 8760 hours. HbA1C: No results for input(s): "HGBA1C" in the last 72 hours. CBG: Recent Labs  Lab 12/30/21 1741  GLUCAP 171*   Lipid Profile: No results  for input(s): "CHOL", "HDL", "LDLCALC", "TRIG", "CHOLHDL", "LDLDIRECT" in the last 72 hours. Thyroid Function Tests: No results for input(s): "TSH", "T4TOTAL", "FREET4", "T3FREE", "THYROIDAB" in the last 72 hours. Anemia Panel: No results for input(s): "VITAMINB12", "FOLATE", "FERRITIN", "TIBC", "IRON", "RETICCTPCT" in the last 72 hours. Urine analysis:    Component Value Date/Time   COLORURINE YELLOW (A) 03/29/2020 1121   APPEARANCEUR HAZY (A) 03/29/2020 1121   LABSPEC 1.011 03/29/2020 1121   PHURINE 5.0 03/29/2020 1121   GLUCOSEU NEGATIVE 03/29/2020 1121   HGBUR NEGATIVE 03/29/2020 1121   BILIRUBINUR NEGATIVE 03/29/2020 1121   KETONESUR NEGATIVE 03/29/2020 1121   PROTEINUR 100 (A) 03/29/2020 1121   NITRITE NEGATIVE 03/29/2020 1121   LEUKOCYTESUR NEGATIVE 03/29/2020 1121    Radiological Exams on Admission: DG Chest 2 View  Result Date: 12/30/2021 CLINICAL DATA:  Chest pain. EXAM: CHEST - 2 VIEW COMPARISON:  Chest XRs, most recently 05/15/2020. FINDINGS: Borderline enlargement of the cardiac silhouette. Aortic valve replacement and arch calcifications. Coronary bypass changes. Lungs are mildly hypoinflated. No focal consolidation or mass. No  pleural effusion or pneumothorax. Median sternotomy. No acute osseous abnormality. IMPRESSION: 1. No acute cardiopulmonary process 2. Borderline cardiomegaly and postsurgical changes of AVR and CABG. Aortic Atherosclerosis (ICD10-I70.0). Electronically Signed   By: Michaelle Birks M.D.   On: 12/30/2021 15:49    EKG: Independently reviewed. See above  Assessment/Plan  Chest pain -in setting of CHF ref, CAD s/p CABG, Aortic stenosis s/p TAVR 2019 -symptoms began at rest, not specifically worse with exertion  -currently still present  -ekg no hyperacute findings  - CE negative x 2  - due to patient hx and continued symptoms patient is admitted for further cardiac risk stratification - nitro, morphine prn  -trial of GI cocktail  - to be complete check d-dimer  -echo, lipids, a1c in am  -cycle ce overnight  -base on results , consider cardiology consult  -resume GDMT   CHFref -currently compensated  -resume home regimen  -carvediolol,entresto,lasix  -ef 30  last echo, ICD recommended , patient declined at that time  DMII -well controlled  -resume entresto, jardiance,lantus 40uqhs. -hold metformin while in patient resume as outpatient  -resume ozempic as outpatient   HLD -continue statin   HTN  -resume home regimen as noted above     Aortic stenosis  -s/p TAVR  2019 -echo in am    Depression -no active issues   DVT prophylaxis:  Code Status: full Family Communication:  Disposition Plan: patient  expected to be admitted less than 2 midnights  Consults called: n/a Admission status: cardiac tele   Clance Boll MD [facility]   If 7PM-7AM, please contact night-coverage [email or web address] Password [facility]  12/30/2021, 7:04 PM

## 2021-12-31 ENCOUNTER — Inpatient Hospital Stay
Admit: 2021-12-31 | Discharge: 2021-12-31 | Disposition: A | Discharge status: Home / Self Care | Payer: BC Managed Care – PPO | Attending: Cardiology | Admitting: Cardiology

## 2021-12-31 ENCOUNTER — Other Ambulatory Visit: Payer: Self-pay

## 2021-12-31 DIAGNOSIS — E669 Obesity, unspecified: Secondary | ICD-10-CM

## 2021-12-31 DIAGNOSIS — R079 Chest pain, unspecified: Secondary | ICD-10-CM | POA: Diagnosis not present

## 2021-12-31 LAB — HEPARIN LEVEL (UNFRACTIONATED)
Heparin Unfractionated: 0.42 IU/mL (ref 0.30–0.70)
Heparin Unfractionated: 0.46 IU/mL (ref 0.30–0.70)

## 2021-12-31 LAB — LIPID PANEL
Cholesterol: 108 mg/dL (ref 0–200)
HDL: 31 mg/dL — ABNORMAL LOW (ref >40)
LDL Cholesterol: 62 mg/dL (ref 0–99)
Total CHOL/HDL Ratio: 3.5 RATIO
Triglycerides: 74 mg/dL (ref <150)
VLDL: 15 mg/dL (ref 0–40)

## 2021-12-31 LAB — TROPONIN I (HIGH SENSITIVITY)
Troponin I (High Sensitivity): 1532 ng/L (ref <18)
Troponin I (High Sensitivity): 1825 ng/L (ref <18)
Troponin I (High Sensitivity): 1824 ng/L (ref <18)
Troponin I (High Sensitivity): 1821 ng/L (ref <18)

## 2021-12-31 LAB — GLUCOSE, CAPILLARY
Glucose-Capillary: 153 mg/dL — ABNORMAL HIGH (ref 70–99)
Glucose-Capillary: 130 mg/dL — ABNORMAL HIGH (ref 70–99)
Glucose-Capillary: 129 mg/dL — ABNORMAL HIGH (ref 70–99)
Glucose-Capillary: 124 mg/dL — ABNORMAL HIGH (ref 70–99)
Glucose-Capillary: 161 mg/dL — ABNORMAL HIGH (ref 70–99)
Glucose-Capillary: 90 mg/dL (ref 70–99)

## 2021-12-31 LAB — HEMOGLOBIN A1C
Hgb A1c MFr Bld: 6.5 % — ABNORMAL HIGH (ref 4.8–5.6)
Mean Plasma Glucose: 139.85 mg/dL

## 2021-12-31 LAB — BRAIN NATRIURETIC PEPTIDE: B Natriuretic Peptide: 223.6 pg/mL — ABNORMAL HIGH (ref 0.0–100.0)

## 2021-12-31 MED ORDER — ASPIRIN 81 MG PO CHEW: 81.0000 mg | CHEWABLE_TABLET | Freq: Every day | ORAL | Status: DC

## 2021-12-31 MED ORDER — CLOPIDOGREL BISULFATE 75 MG PO TABS
75.0000 mg | ORAL_TABLET | Freq: Every day | ORAL | Status: DC
  Administered 2021-12-31: 75 mg via ORAL
  Filled 2021-12-31: qty 1

## 2021-12-31 MED ORDER — ASPIRIN 81 MG PO CHEW
324.0000 mg | CHEWABLE_TABLET | Freq: Once | ORAL | Status: AC
Start: 2021-12-31 — End: 2021-12-31
  Administered 2021-12-31: 324 mg via ORAL
  Filled 2021-12-31: qty 4

## 2021-12-31 MED ORDER — PERFLUTREN LIPID MICROSPHERE
1.0000 mL | INTRAVENOUS | Status: AC | PRN
  Administered 2021-12-31: 2 mL via INTRAVENOUS

## 2021-12-31 MED ORDER — CLOPIDOGREL BISULFATE 75 MG PO TABS: 75.0000 mg | ORAL_TABLET | Freq: Every day | ORAL | Status: DC

## 2021-12-31 MED ORDER — ASPIRIN 81 MG PO CHEW
81.0000 mg | CHEWABLE_TABLET | Freq: Every day | ORAL | Status: DC
  Administered 2022-01-01: 81 mg via ORAL
  Filled 2021-12-31: qty 1

## 2021-12-31 NOTE — Progress Notes (Signed)
ANTICOAGULATION CONSULT NOTE - Initial Consult  Pharmacy Consult for Heparin Indication: chest pain/ACS  No Known Allergies  Patient Measurements: Height: 5\' 9"  (175.3 cm) Weight: 104.3 kg (230 lb) IBW/kg (Calculated) : 70.7 Heparin Dosing Weight:  93.2 kg   Vital Signs: Temp: 98.1 F (36.7 C) (08/17 0732) Temp Source: Oral (08/17 0732) BP: 99/57 (08/17 0732) Pulse Rate: 54 (08/17 0732)  Labs: Recent Labs    12/30/21 1506 12/30/21 1712 12/30/21 1949 12/30/21 2158 12/31/21 0753  HGB 16.4  --   --   --   --   HCT 48.5  --   --   --   --   PLT 211  --   --   --   --   HEPARINUNFRC  --   --   --   --  0.42  CREATININE 1.19  --   --   --   --   TROPONINIHS 11   < > 34* 124* 1,532*   < > = values in this interval not displayed.     Estimated Creatinine Clearance: 63.8 mL/min (by C-G formula based on SCr of 1.19 mg/dL).   Medical History: Past Medical History:  Diagnosis Date   CHF (congestive heart failure) (HCC)    Diabetes mellitus without complication (HCC)    Hypertension    MI (myocardial infarction) (HCC)     Medications:  Medications Prior to Admission  Medication Sig Dispense Refill Last Dose   ascorbic acid (VITAMIN C) 500 MG tablet Take 500 mg by mouth daily.   12/30/2021 at 0830   atorvastatin (LIPITOR) 80 MG tablet Take 80 mg by mouth at bedtime.   12/29/2021 at 1700   B Complex-C-Folic Acid (RENAL) 1 MG CAPS Take 1 capsule by mouth every evening.   12/29/2021 at 1700   carvedilol (COREG) 3.125 MG tablet Take 1 tablet (3.125 mg total) by mouth in the morning and at bedtime. 60 tablet 2 12/30/2021 at 0830   clopidogrel (PLAVIX) 75 MG tablet Take 1 tablet (75 mg total) by mouth every evening. 90 tablet 3 12/29/2021 at 1700   furosemide (LASIX) 20 MG tablet Take 3 tablets (60 mg total) by mouth daily. (Patient taking differently: Take 20 mg by mouth daily. Take along with one 40 mg tablet for total 60 mg dose once daily) 270 tablet 0 12/30/2021 at 0830    furosemide (LASIX) 40 MG tablet Take 40 mg by mouth daily. Take along with one 20 mg tablet for total 60 mg dose once daily   12/30/2021 at 0830   insulin glargine (LANTUS) 100 UNIT/ML Solostar Pen Inject 40 Units into the skin at bedtime.   12/30/2021 at 0200   JARDIANCE 25 MG TABS tablet Take 25 mg by mouth daily.   12/30/2021 at 0830   metFORMIN (GLUCOPHAGE) 500 MG tablet Take 500 mg by mouth 2 (two) times daily.   12/30/2021 at 0830   Multiple Vitamins-Minerals (MULTIVITAMIN ADULTS 50+) TABS Take 1 tablet by mouth daily.   12/29/2021 at 1700   Omega-3 1000 MG CAPS Take 1,000 mg by mouth every 3 (three) days.   Past Week   Probiotic Product (PROBIOTIC-10) CHEW Chew 1 capsule by mouth every other day.   Past Week   sacubitril-valsartan (ENTRESTO) 49-51 MG Take 1 tablet by mouth in the morning and at bedtime.   12/30/2021 at 0830   Semaglutide, 1 MG/DOSE, (OZEMPIC, 1 MG/DOSE,) 4 MG/3ML SOPN Inject 1 mg into the skin once a week. Sunday  12/27/2021    Assessment: Pharmacy consulted to dose heparin in this 76 year old male admitted with ACS/NSTEMI.  Pt was on heparin 5000 units SQ Q8H , last dose on 8/16 @ 2247.  CrCl = 63.8 ml/min  Goal of Therapy:  Heparin level 0.3-0.7 units/ml Monitor platelets by anticoagulation protocol: Yes   Date/time aPTT/HL Comments 8/17@0753  HL 0.42 Therapeutic x 1  Plan:  Continue heparin infusion at 1300 units/hr Check heparin level in 8 hours to confirm therapeutic rate Continue to monitor H&H and platelets  Ashlinn Hemrick Rodriguez-Guzman PharmD, BCPS 12/31/2021 10:06 AM

## 2021-12-31 NOTE — Consult Note (Cosign Needed Addendum)
[place] NOTE       Patient ID: Adam Ferguson MRN: [id number] DOB/AGE: [birth year] 76 y.o.  Admit date: 12/30/2021 Referring Physician Dr. Myles Rosenthal Primary Physician Dr. Ola Spurr Primary Cardiologist Dr. Nehemiah Massed  Reason for Consultation NSTEMI   HPI: Adam Ferguson is a 76yoM with a PMH of CAD s/p CABG x3 in 08/2017 (patent LIMA-LAD, occluded SVG-OM1 and rPDA, severe native CAD) s/p PCI to ostial LMT into prox Lcx to mid Lcx and rotation arthrectomy of LMT - Lcx in 01/2018, severe AS s/p bioprosthetic TAVR 04/2018 and LAA thrombus treated with eliquis, ICM (LVEF 30% 10/2020), type 2 diabetes who presented to [facility] ED the afternoon of 12/30/2021 with chest pain.  Cardiology is consulted for further assistance.  The patient states yesterday afternoon he had just finished eating lunch and was working at his desk when he suddenly had some left-sided chest tightness and soreness.  He never had any symptoms like this before, and the soreness just did not go away.  He thought it was may be "gas pains" and rated it about a 3 or 4 out of 10.  He notes he does not have acid reflux.  He does not typically have chest pain and did not take any nitroglycerin or any other medicines to help with the discomfort.  He said he felt somewhat winded and fatigued when he was walking up the stairs in his home, but not significantly out of breath.  The discomfort lasted for about an hour, then he presented to [facility] walk-in clinic and was directed immediately to [facility] ED.  He was sleeping on his right side upon my entry into the room.  He is chest pain-free this morning.  Denies shortness of breath, dizziness, palpitations, orthopnea, lower extremity edema.  He has been doing fairly well since his bypass and subsequent PCI's in 2019 performed in [place].  At his most recent cardiology visit in March of this year, an ICD was recommended for secondary prevention with his reduced EF, but  he declined referral to EP for this at that visit.  Vitals are notable for a blood pressure of 95/61, heart rate 57 in sinus bradycardia on telemetry.  SPO2 96% on room air.  Labs are notable for a potassium of 4.2, BUN/creatinine 19/1.19 GFR greater than 60.  High-sensitivity troponin uptrending from [phone] with repeats pending.  BNP minimally elevated at 223.  TC 108 HDL 31, LDL 62 triglycerides 74.  H&H stable at 16/48 with platelets 211  Review of systems complete and found to be negative unless listed above     Past Medical History:  Diagnosis Date   CHF (congestive heart failure) ([place])    Diabetes mellitus without complication ([place])    Hypertension    MI (myocardial infarction) ([place])     Past Surgical History:  Procedure Laterality Date   AORTIC VALVE REPLACEMENT (AVR)/CORONARY ARTERY BYPASS GRAFTING (CABG)     CATARACT EXTRACTION W/PHACO Right 08/03/2021   Procedure: CATARACT EXTRACTION PHACO AND INTRAOCULAR LENS PLACEMENT (IOC) RIGHT DIABETIC 9.61 01:37.1;  Surgeon: Eulogio Bear, MD;  Location: [facility];  Service: Ophthalmology;  Laterality: Right;  Diabetic   CATARACT EXTRACTION W/PHACO Left 08/17/2021   Procedure: CATARACT EXTRACTION PHACO AND INTRAOCULAR LENS PLACEMENT (IOC) LEFT DIABETIC 3.33 00:33.1;  Surgeon: Eulogio Bear, MD;  Location: [facility];  Service: Ophthalmology;  Laterality: Left;  Diabetic   STENT PLACEMENT VASCULAR ([place] [facility])     VALVE REPLACEMENT  Medications Prior to Admission  Medication Sig Dispense Refill Last Dose   ascorbic acid (VITAMIN C) 500 MG tablet Take 500 mg by mouth daily.   12/30/2021 at 0830   atorvastatin (LIPITOR) 80 MG tablet Take 80 mg by mouth at bedtime.   12/29/2021 at 1700   B Complex-C-Folic Acid (RENAL) 1 MG CAPS Take 1 capsule by mouth every evening.   12/29/2021 at 1700   carvedilol (COREG) 3.125 MG tablet Take 1 tablet (3.125 mg total) by mouth in the morning and at bedtime. 60 tablet  2 12/30/2021 at 0830   clopidogrel (PLAVIX) 75 MG tablet Take 1 tablet (75 mg total) by mouth every evening. 90 tablet 3 12/29/2021 at 1700   furosemide (LASIX) 20 MG tablet Take 3 tablets (60 mg total) by mouth daily. (Patient taking differently: Take 20 mg by mouth daily. Take along with one 40 mg tablet for total 60 mg dose once daily) 270 tablet 0 12/30/2021 at 0830   furosemide (LASIX) 40 MG tablet Take 40 mg by mouth daily. Take along with one 20 mg tablet for total 60 mg dose once daily   12/30/2021 at 0830   insulin glargine (LANTUS) 100 UNIT/ML Solostar Pen Inject 40 Units into the skin at bedtime.   12/30/2021 at 0200   JARDIANCE 25 MG TABS tablet Take 25 mg by mouth daily.   12/30/2021 at 0830   metFORMIN (GLUCOPHAGE) 500 MG tablet Take 500 mg by mouth 2 (two) times daily.   12/30/2021 at 0830   Multiple Vitamins-Minerals (MULTIVITAMIN ADULTS 50+) TABS Take 1 tablet by mouth daily.   12/29/2021 at 1700   Omega-3 1000 MG CAPS Take 1,000 mg by mouth every 3 (three) days.   Past Week   Probiotic Product (PROBIOTIC-10) CHEW Chew 1 capsule by mouth every other day.   Past Week   sacubitril-valsartan (ENTRESTO) 49-51 MG Take 1 tablet by mouth in the morning and at bedtime.   12/30/2021 at 0830   Semaglutide, 1 MG/DOSE, (OZEMPIC, 1 MG/DOSE,) 4 MG/3ML SOPN Inject 1 mg into the skin once a week. Sunday   12/27/2021   Social History   Socioeconomic History   Marital status: Married    Spouse name: Not on file   Number of children: Not on file   Years of education: Not on file   Highest education level: Not on file  Occupational History   Not on file  Tobacco Use   Smoking status: Never   Smokeless tobacco: Never  Vaping Use   Vaping Use: Never used  Substance and Sexual Activity   Alcohol use: Not Currently   Drug use: Not Currently   Sexual activity: Not Currently  Other Topics Concern   Not on file  Social History Narrative   Not on file   Social Determinants of Health   Financial  Resource Strain: Not on file  Food Insecurity: Not on file  Transportation Needs: Not on file  Physical Activity: Not on file  Stress: Not on file  Social Connections: Not on file  Intimate Partner Violence: Not on file    Family History  Problem Relation Age of Onset   Other Neg Hx       PHYSICAL EXAM General: Elderly Caucasian male, well nourished, in no acute distress.  Sitting upright in PCU bed HEENT:  Normocephalic and atraumatic. Neck:  No JVD.  Lungs: Normal respiratory effort on room air. Clear bilaterally to auscultation. No wheezes, crackles, rhonchi.  Heart: Bradycardic but regular. Normal  S1 and S2 without gallops or murmurs. Radial & DP pulses 2+ bilaterally. Abdomen: Obese appearing.  Msk: Normal strength and tone for age. Extremities: Warm and well perfused. No clubbing, cyanosis.  No peripheral edema.  Neuro: Alert and oriented X 3. Psych:  Answers questions appropriately.   Labs:   Lab Results  Component Value Date   WBC 5.6 12/30/2021   HGB 16.4 12/30/2021   HCT 48.5 12/30/2021   MCV 93.4 12/30/2021   PLT 211 12/30/2021    Recent Labs  Lab 12/30/21 1506  NA 139  K 4.2  CL 103  CO2 25  BUN 19  CREATININE 1.19  CALCIUM 8.9  GLUCOSE 195*   No results found for: "CKTOTAL", "CKMB", "CKMBINDEX", "TROPONINI"  Lab Results  Component Value Date   CHOL 108 12/31/2021   Lab Results  Component Value Date   HDL 31 (L) 12/31/2021   Lab Results  Component Value Date   LDLCALC 62 12/31/2021   Lab Results  Component Value Date   TRIG 74 12/31/2021   Lab Results  Component Value Date   CHOLHDL 3.5 12/31/2021   No results found for: "LDLDIRECT"    Radiology: CT Angio Chest Pulmonary Embolism (PE) W or WO Contrast  Result Date: 12/31/2021 CLINICAL DATA:  Substernal chest pain for 1 day, initial encounter EXAM: CT ANGIOGRAPHY CHEST WITH CONTRAST TECHNIQUE: Multidetector CT imaging of the chest was performed using the standard protocol during  bolus administration of intravenous contrast. Multiplanar CT image reconstructions and MIPs were obtained to evaluate the vascular anatomy. RADIATION DOSE REDUCTION: This exam was performed according to the departmental dose-optimization program which includes automated exposure control, adjustment of the mA and/or kV according to patient size and/or use of iterative reconstruction technique. CONTRAST:  50mL OMNIPAQUE IOHEXOL 350 MG/ML SOLN COMPARISON:  Chest x-ray from earlier in the same day. FINDINGS: Cardiovascular: Atherosclerotic calcifications of the thoracic aorta are noted. Changes of prior TAVR are seen. Heart is at the upper limits of normal in size. Coronary calcifications are seen. The pulmonary artery shows a normal branching pattern. No filling defect to suggest pulmonary embolism is noted. Changes of prior coronary bypass grafting are noted. Mediastinum/Nodes: Thoracic inlet is within normal limits. No hilar or mediastinal adenopathy is noted. The esophagus is within normal limits. Lungs/Pleura: The right lung is well aerated without focal infiltrate or sizable effusion. Small left effusion is noted as well as for left basilar consolidation which may represent early infiltrate. No other focal abnormality in the left lung is seen. Upper Abdomen: Cholelithiasis is noted without complicating factors. The remainder of the upper abdomen is within normal limits. Musculoskeletal: Degenerative changes of the thoracic spine are noted. No acute rib abnormality is noted. Review of the MIP images confirms the above findings. IMPRESSION: No evidence of pulmonary emboli. Left basilar consolidation and small left effusion. Cholelithiasis without complicating factors. Aortic Atherosclerosis (ICD10-I70.0). Electronically Signed   By: Alcide Clever M.D.   On: 12/31/2021 00:10   DG Chest 2 View  Result Date: 12/30/2021 CLINICAL DATA:  Chest pain. EXAM: CHEST - 2 VIEW COMPARISON:  Chest XRs, most recently 05/15/2020.  FINDINGS: Borderline enlargement of the cardiac silhouette. Aortic valve replacement and arch calcifications. Coronary bypass changes. Lungs are mildly hypoinflated. No focal consolidation or mass. No pleural effusion or pneumothorax. Median sternotomy. No acute osseous abnormality. IMPRESSION: 1. No acute cardiopulmonary process 2. Borderline cardiomegaly and postsurgical changes of AVR and CABG. Aortic Atherosclerosis (ICD10-I70.0). Electronically Signed   By: Cletis Athens  Mugweru M.D.   On: 12/30/2021 15:49    Revloc 01/2018  CORONARY ANGIOGRAPHY:  - Left main coronary artery: severe stenosis in proximal, and  distal segments (severe calcified nodules and diffuse Ca+)  - Left anterior descending coronary artery: known occluded  ostially; prior cath showed patent LIMA  - Left circumflex coronary artery: severe ostial disease from  dLMT stenosis, severely calcified.  Severe diffuse calcium from  proximal portion to mid segment.  Focal Ca+ nodule just prior to  bifurcation with large OM  - Ramus intermediate artery: not present  - Right coronary artery: not re-engaged.  Small, diffusely  diseased   IMPRESSION:  1. Successful PCI of a calcified sutbotal distal LMT stenosis and  severe ostial LMT to mid LCx lesions with preceding rotational  atherectomy   RECOMMENDATIONS:  - Return to inpatient stepdown bed  - DAPT (ASA, Prasugrel) for 2 weeks while also on DOAC (for LAA  thrombus); stop ASA after 2 weeks and continue Prasugrel and DOAC  - Continue OMT  - 2 hours bedrest  - Cardiac rehab   01/17/2018 CORONARY ANGIOGRAPHY:  - Left main coronary artery: severe, Ca+ mid and distal LMT  stenoses extending into the occluded ostial LAD and the severely  stenosed ostial LCx  - Left anterior descending coronary artery: occluded proximally.    Filled via patent LIMA to mid LAD.  Moderate focal stenosis after  anastomosis in distal LAD  - Left circumflex coronary artery: non-dominant but  large with a  large OM1/posterolateral branch and a large AV circumflex.  The  vessel is filled via compromised antegrade flow from the LMT and  retrograde proximal LAD flow which is also compromised by the  proximal LAD stenosis.  The SVG is occluded  - Ramus intermediate artery: absent  - Right coronary artery: dominant with diffuse Ca+ and severe  ostial and mid vessel segment stenoses.  The rest of the vessel  has moderate diffuse disease.  The SVG to rPDA is occluded.    There is no apparent collateral filling.     HEMODYNAMIC FINDINGS:  - SEE EXPER REPORT FOR ADDITIONAL DETAILS   Right Heart Catheterization and Hemodynamic Data:    Pressure (mmHg) Oxygen saturation %  Right atrium 8 (mean) ---  Right ventricle 65/8 ---  Pulmonary artery 60/21 (mean 36) 61.0%  Pulmonary wedge 22 (mean); V-waves to 30 ---  Left ventricle 122/8 ---  Aorta 109/55 (mean 77) 91%    Cardiac output (liters/min) Cardiac index (l/min//m2)  Fick equation 6.57 3.13   Valvular Data:  - Transaortic valvular gradient: 20.4 (mean); peak-to-peak (14);  1.77cm2 (via simultaneous with Kandyce Rud and pull-back with  [place])   IMPRESSION:  1. Severe multi-vessel CAD with patent LIMA:LAD, but occluded SVG  to OM1 and rPDA.  Compromised flow to the previously grafted  vessels (OM1 and rPDA) with severe native CAD.  The native  coronaries appear to be good interventional targets.  The LCx/OM  can be revascularized via LMT:LCx atherectomy and PCI.  The RCA  can be revascularized with ostial to mid vessel stents, though  the entirety of the vessel is moderately diseased.  2. Low left and right ventricular filling pressures with  preserved cardiac indices  3. Mild pulmonary arterial hypertension (mPA 36)  4. Moderate aortic stenosis with AVA 1.77; mean gradient 20.4,  peak to peak of 14 mmHg.   RECOMMENDATIONS:  - Plan revascularization of LMT into LCx with rotational  atherectomy and RCA revascularization   -  2h bedrest then DC home after additional 2h of monitoring  (Angioseal VCD)   ECHO  Flossie Dibble, MD - 10/30/2020  Formatting of this note might be different from the original.                         [place], [place]                                    [id number]            [place] #: [id number]            [address] [place], [place] [address]       Date: 10/27/2020 04: 65 PM                                                               Adult   Male  Age: 72 yrs            ECHOCARDIOGRAM REPORT                              Outpatient                                                               [facility]       STUDY:CHEST WALL               TAPE:                    MD1: KOWALSKI, BRUCE JAY        ECHO:Yes    DOPPLER:Yes       FILE:                    BP: 120/70 mmHg       COLOR:Yes   CONTRAST:No     MACHINE:Philips   RV BIOPSY:No          3D:No  SOUND QLTY:Moderate            Height: 69 in      MEDIUM:None                                              Weight: 233 lb  BSA: 2.2 m2  _________________________________________________________________________________________                HISTORY: DOE                 REASON: Assess, LV function             INDICATION: Chronic systolic CHF (congestive heart failure), NYHA class 3 (C  _________________________________________________________________________________________  ECHOCARDIOGRAPHIC MEASUREMENTS  2D DIMENSIONS  AORTA                  Values   Normal Range   MAIN PA         Values    Normal Range                Annulus: 2.1 cm       [2.3-2.9]         PA Main: nm*       [1.5-2.1]              Aorta Sin: 3.2 cm       [3.1-3.7]    RIGHT VENTRICLE            ST Junction: nm*          [2.6-3.2]         RV Base: nm*       [<4.2]              Asc.Aorta: nm*           [2.6-3.4]          RV Mid: 2.8 cm    [<3.5]  LEFT VENTRICLE                                      RV Length: nm*       [<8.6]                  LVIDd: 6.4 cm       [4.2-5.9]    INFERIOR VENA CAVA                  LVIDs: 5.5 cm                        Max. IVC: nm*       [<=2.1]                     FS: 14.1 %       [>25]            Min. IVC: nm*                    SWT: 1.1 cm       [0.6-1.0]    ------------------                    PWT: 1.1 cm       [0.6-1.0]    nm* - not measured  LEFT ATRIUM                LA Diam: 4.7 cm       [3.0-4.0]            LA A4C Area: nm*          [<20]              LA Volume: nm*          [  18-58]  _________________________________________________________________________________________  ECHOCARDIOGRAPHIC DESCRIPTIONS  AORTIC ROOT                   Size: Normal             Dissection: INDETERM FOR DISSECTION  AORTIC VALVE               Leaflets: BIOPROSTHETIC               Morphology: Normal               Mobility: Fully mobile  LEFT VENTRICLE                   Size: MODERATELY ENLARGED           Anterior: HYPOCONTRACTILE            Contraction: REGIONALLY IMPAIRED            Lateral: HYPOCONTRACTILE             Closest EF: 30% (Estimated)                 Septal: HYPOCONTRACTILE              LV Masses: No Masses                       Apical: AKINETIC                    LVH: None                          Inferior: HYPOCONTRACTILE                                                      Posterior: AKINETIC           Dias.FxClass: N/A  MITRAL VALVE               Leaflets: Normal                        Mobility: Fully mobile             Morphology: ANNULAR CALC  LEFT ATRIUM                   Size: MODERATELY ENLARGED          LA Masses: No masses              IA Septum: Normal IAS  MAIN PA                   Size: Normal  PULMONIC VALVE             Morphology: Normal                        Mobility: Fully mobile  RIGHT VENTRICLE              RV Masses: No  Masses                         Size: Normal              Free Wall: Normal  Contraction: Normal  TRICUSPID VALVE               Leaflets: Normal                        Mobility: Fully mobile             Morphology: Normal  RIGHT ATRIUM                   Size: Normal                        RA Other: None                RA Mass: No masses  PERICARDIUM                  Fluid: No effusion  INFERIOR VENACAVA                   Size: Normal Normal respiratory collapse  _________________________________________________________________________________________   DOPPLER ECHO and OTHER SPECIAL PROCEDURES                 Aortic: No AR                      BIOPROSTHETIC AoV                         205.0 cm/sec peak vel      16.8 mmHg peak grad                         8.8 mmHg mean grad                 Mitral: TRIVIAL MR                 No MS                         MV Inflow E Vel = 54.0 cm/sec     MV Annulus E'Vel = 4.6 cm/sec                         E/E'Ratio = 11.7              Tricuspid: TRIVIAL TR                 No TS              Pulmonary: No PR                      No PS                         86.9 cm/sec peak vel       3.0 mmHg peak grad  _________________________________________________________________________________________  INTERPRETATION  MODERATE LV SYSTOLIC DYSFUNCTION (See above)  NORMAL RIGHT VENTRICULAR SYSTOLIC FUNCTION  TRIVIAL MR, TR  EF 30%  NORMAL BIO PROSTHETIC AORTIC VALVE   TELEMETRY reviewed by me: Sinus bradycardia rate high 40s to low 50s.  EKG reviewed by me: Sinus bradycardia rate 57, new lateral T wave inversions  ASSESSMENT AND PLAN:  Adam Ferguson is a 35yoM with a PMH of CAD s/p CABG x3 in 08/2017 (patent LIMA-LAD, occluded SVG-OM1 and rPDA, severe native CAD by [facility]  01/2018) s/p PCI to ostial LMT into prox Lcx to mid Lcx and rotation arthrectomy of LMT - Lcx in 01/2018, severe AS s/p bioprosthetic TAVR 04/2018 and LAA thrombus  treated with eliquis, ICM (LVEF 30% 10/2020), type 2 diabetes who presented to [facility] ED the afternoon of 12/30/2021 with chest pain.  Cardiology is consulted for further assistance.  #NSTEMI #CAD s/p CABG x3 08/2017, complex PCI/arthrectomy to LCx 01/2018 #Severe AS s/p bioprosthetic TAVR 04/2018 The patient presents with 1 episode of dull chest tightness he rated a 4-5 out of 10 that occurred while he was sitting at his computer after eating lunch on 8/16 lasting about an hour and a half before terminating on its own.  He notes he has had some chest discomfort associated with shortness of breath in the setting of heart failure exacerbations in the past, but this discomfort was different.  He currently feels well, without recurrence in his chest tightness since admission.  Troponin peaked at 1825 earlier this morning, EKG with lateral T wave inversions new from prior, certainly concerning for ischemia. -S/p 325 mg aspirin, will add 81 mg aspirin daily in addition to the clopidogrel 75 mg he was previously taking at home. -Continue heparin drip for ideally 48 hours -Continue carvedilol 3.125 mg twice daily -Continue atorvastatin 80 mg daily -We will consider addition of low-dose nitrate tomorrow -Repeat echocardiogram complete -Dr. Saralyn Pilar and I discussed in detail with the patient the options for further management of his NSTEMI with either heart catheterization or conservative medical management with functional study on an outpatient basis.  Since the patient has remained chest pain-free, troponin with relatively low peak at 1825 -the patient prefers to to avoid invasive procedures and will continue medical management with DAPT and heparin as above, which is certainly reasonable. Cardiac rehab referral deferred because he is pending further outpatient ischemic workup with either functional study or LHC.   #ICM (LVEF 30% 10/2020) Appears clinically euvolemic, BNP insignificantly elevated at 225.  We will  repeat echo as above to evaluate heart function and his bioprosthetic aortic valve -Continue GDMT with Coreg 3.125 mg twice daily, Jardiance 25 mg once daily, Lasix 60 mg p.o. once daily -Hold Entresto 49-51 for now with relative hypotension, consider restarting tomorrow morning at a lower dose  This patient's plan of care was discussed and created with Dr. Saralyn Pilar and he is in agreement.  Signed: Tristan Schroeder , PA-C 12/31/2021, 9:27 AM [facility]

## 2021-12-31 NOTE — Progress Notes (Signed)
ANTICOAGULATION CONSULT NOTE - Initial Consult  Pharmacy Consult for Heparin Indication: chest pain/ACS  No Known Allergies  Patient Measurements: Height: 5\' 9"  (175.3 cm) Weight: 104.3 kg (230 lb) IBW/kg (Calculated) : 70.7 Heparin Dosing Weight:  93.2 kg   Vital Signs: Temp: 98 F (36.7 C) (08/17 1637) Temp Source: Oral (08/17 1133) BP: 97/60 (08/17 1637) Pulse Rate: 60 (08/17 1637)  Labs: Recent Labs    12/30/21 1506 12/30/21 1712 12/31/21 0753 12/31/21 1004 12/31/21 1324 12/31/21 1627  HGB 16.4  --   --   --   --   --   HCT 48.5  --   --   --   --   --   PLT 211  --   --   --   --   --   HEPARINUNFRC  --   --  0.42  --   --  0.46  CREATININE 1.19  --   --   --   --   --   TROPONINIHS 11   < > 1,532* 1,825* 1,824*  --    < > = values in this interval not displayed.     Estimated Creatinine Clearance: 63.8 mL/min (by C-G formula based on SCr of 1.19 mg/dL).   Medical History: Past Medical History:  Diagnosis Date   CHF (congestive heart failure) (HCC)    Diabetes mellitus without complication (HCC)    Hypertension    MI (myocardial infarction) (HCC)     Medications:  Medications Prior to Admission  Medication Sig Dispense Refill Last Dose   ascorbic acid (VITAMIN C) 500 MG tablet Take 500 mg by mouth daily.   12/30/2021 at 0830   atorvastatin (LIPITOR) 80 MG tablet Take 80 mg by mouth at bedtime.   12/29/2021 at 1700   B Complex-C-Folic Acid (RENAL) 1 MG CAPS Take 1 capsule by mouth every evening.   12/29/2021 at 1700   carvedilol (COREG) 3.125 MG tablet Take 1 tablet (3.125 mg total) by mouth in the morning and at bedtime. 60 tablet 2 12/30/2021 at 0830   clopidogrel (PLAVIX) 75 MG tablet Take 1 tablet (75 mg total) by mouth every evening. 90 tablet 3 12/29/2021 at 1700   furosemide (LASIX) 20 MG tablet Take 3 tablets (60 mg total) by mouth daily. (Patient taking differently: Take 20 mg by mouth daily. Take along with one 40 mg tablet for total 60 mg dose once  daily) 270 tablet 0 12/30/2021 at 0830   furosemide (LASIX) 40 MG tablet Take 40 mg by mouth daily. Take along with one 20 mg tablet for total 60 mg dose once daily   12/30/2021 at 0830   insulin glargine (LANTUS) 100 UNIT/ML Solostar Pen Inject 40 Units into the skin at bedtime.   12/30/2021 at 0200   JARDIANCE 25 MG TABS tablet Take 25 mg by mouth daily.   12/30/2021 at 0830   metFORMIN (GLUCOPHAGE) 500 MG tablet Take 500 mg by mouth 2 (two) times daily.   12/30/2021 at 0830   Multiple Vitamins-Minerals (MULTIVITAMIN ADULTS 50+) TABS Take 1 tablet by mouth daily.   12/29/2021 at 1700   Omega-3 1000 MG CAPS Take 1,000 mg by mouth every 3 (three) days.   Past Week   Probiotic Product (PROBIOTIC-10) CHEW Chew 1 capsule by mouth every other day.   Past Week   sacubitril-valsartan (ENTRESTO) 49-51 MG Take 1 tablet by mouth in the morning and at bedtime.   12/30/2021 at 0830   Semaglutide, 1 MG/DOSE, (  OZEMPIC, 1 MG/DOSE,) 4 MG/3ML SOPN Inject 1 mg into the skin once a week. Sunday   12/27/2021    Assessment: Pharmacy consulted to dose heparin in this 76 year old male admitted with ACS/NSTEMI.  Pt was on heparin 5000 units SQ Q8H , last dose on 8/16 @ 2247.  CrCl = 63.8 ml/min  Goal of Therapy:  Heparin level 0.3-0.7 units/ml Monitor platelets by anticoagulation protocol: Yes   Date/time aPTT/HL Comments 8/17@0753  HL 0.42 Therapeutic x 1 8/17@1627  HL 0.46 Therapeutic x 2  Plan:  Continue heparin infusion at 1300 units/hr Recheck heparin level with AM labs Continue to monitor H&H and platelets  Bettey Costa, PharmD Clinical Pharmacist 12/31/2021 4:52 PM

## 2021-12-31 NOTE — Progress Notes (Signed)
PROGRESS NOTE    Adam Ferguson  IEP:[id number] DOB: 09/24/1945 DOA: 12/30/2021 PCP: Mick Sell, MD  Outpatient Specialists: cardiology    Brief Narrative:   Presenting with substernal chest pain, found to have nstemi   Assessment & Plan:   Principal Problem:   Chest pain Active Problems:   Type 2 diabetes mellitus without complication, with long-term current use of insulin (HCC)   Essential hypertension   CAD (coronary artery disease)   Obesity (BMI 30-39.9)   Hx of CABG   S/P TAVR (transcatheter aortic valve replacement)  # NSTEMI # CAD Presenting 8/16 with substernal chest pain, mild, that started shortly prior to arrival. Currently chest pain free. CTA negative for PE/infection. Initial troponin normal but elevated to 1500 this morning. No signs fluid overload. Hx cabg x3 and pci x2 both in 2019. - continue heparin, received aspirin this morning - cont home asa/plavix - cont statin - trend trop to peak - npo - cardiology to see  # HFrEF EF 30% most recent echo, here compensated - continue coreg - hold entresto 2/2 hypotension - cont lasix  # T2DM Here glucose wnl - cont lantus, jardiance - home metformin, semaglutide on hold     DVT prophylaxis: iv heparin Code Status: full Family Communication: wife updated telephonically 8/17  Level of care: Telemetry Cardiac Status is: Inpatient Remains inpatient appropriate because: severity of illness    Consultants:  cardiology  Procedures: pending  Antimicrobials:  none    Subjective: Chest pain resolved, no sob or cough   Objective: Vitals:   12/30/21 2152 12/31/21 0029 12/31/21 0347 12/31/21 0732  BP: (!) 135/52 115/70 (!) 99/58 (!) 99/57  Pulse: (!) 54 60 (!) 55 (!) 54  Resp: 18 18 18 16   Temp: (!) 97.5 F (36.4 C) 98.4 F (36.9 C) 97.9 F (36.6 C) 98.1 F (36.7 C)  TempSrc: Oral   Oral  SpO2: 96% 96% 98% 96%  Weight:      Height:        Intake/Output Summary (Last 24  hours) at 12/31/2021 1105 Last data filed at 12/31/2021 01/02/2022 Gross per 24 hour  Intake --  Output 650 ml  Net -650 ml   Filed Weights   12/30/21 1457 12/30/21 1730  Weight: 104.3 kg 104.3 kg    Examination:  General exam: Appears calm and comfortable  Respiratory system: Clear to auscultation. Respiratory effort normal. Cardiovascular system: S1 & S2 heard, RRR. Soft systolic murmur Gastrointestinal system: Abdomen is nondistended, soft and nontender. No organomegaly or masses felt. Normal bowel sounds heard. Central nervous system: Alert and oriented. No focal neurological deficits. Extremities: Symmetric 5 x 5 power. Skin: No rashes, lesions or ulcers Psychiatry: Judgement and insight appear normal. Mood & affect appropriate.     Data Reviewed: I have personally reviewed following labs and imaging studies  CBC: Recent Labs  Lab 12/30/21 1506  WBC 5.6  HGB 16.4  HCT 48.5  MCV 93.4  PLT 211   Basic Metabolic Panel: Recent Labs  Lab 12/30/21 1506  NA 139  K 4.2  CL 103  CO2 25  GLUCOSE 195*  BUN 19  CREATININE 1.19  CALCIUM 8.9   GFR: Estimated Creatinine Clearance: 63.8 mL/min (by C-G formula based on SCr of 1.19 mg/dL). Liver Function Tests: No results for input(s): "AST", "ALT", "ALKPHOS", "BILITOT", "PROT", "ALBUMIN" in the last 168 hours. No results for input(s): "LIPASE", "AMYLASE" in the last 168 hours. No results for input(s): "AMMONIA" in the last 168  hours. Coagulation Profile: No results for input(s): "INR", "PROTIME" in the last 168 hours. Cardiac Enzymes: No results for input(s): "CKTOTAL", "CKMB", "CKMBINDEX", "TROPONINI" in the last 168 hours. BNP (last 3 results) No results for input(s): "PROBNP" in the last 8760 hours. HbA1C: Recent Labs    12/30/21 1506  HGBA1C 6.5*   CBG: Recent Labs  Lab 12/30/21 1741 12/31/21 0027 12/31/21 0512 12/31/21 0731  GLUCAP 171* 153* 130* 129*   Lipid Profile: Recent Labs    12/31/21 0753   CHOL 108  HDL 31*  LDLCALC 62  TRIG 74  CHOLHDL 3.5   Thyroid Function Tests: No results for input(s): "TSH", "T4TOTAL", "FREET4", "T3FREE", "THYROIDAB" in the last 72 hours. Anemia Panel: No results for input(s): "VITAMINB12", "FOLATE", "FERRITIN", "TIBC", "IRON", "RETICCTPCT" in the last 72 hours. Urine analysis:    Component Value Date/Time   COLORURINE YELLOW (A) 03/29/2020 1121   APPEARANCEUR HAZY (A) 03/29/2020 1121   LABSPEC 1.011 03/29/2020 1121   PHURINE 5.0 03/29/2020 1121   GLUCOSEU NEGATIVE 03/29/2020 1121   HGBUR NEGATIVE 03/29/2020 1121   BILIRUBINUR NEGATIVE 03/29/2020 1121   KETONESUR NEGATIVE 03/29/2020 1121   PROTEINUR 100 (A) 03/29/2020 1121   NITRITE NEGATIVE 03/29/2020 1121   LEUKOCYTESUR NEGATIVE 03/29/2020 1121   Sepsis Labs: @LABRCNTIP (procalcitonin:4,lacticidven:4)  )No results found for this or any previous visit (from the past 240 hour(s)).       Radiology Studies: CT Angio Chest Pulmonary Embolism (PE) W or WO Contrast  Result Date: 12/31/2021 CLINICAL DATA:  Substernal chest pain for 1 day, initial encounter EXAM: CT ANGIOGRAPHY CHEST WITH CONTRAST TECHNIQUE: Multidetector CT imaging of the chest was performed using the standard protocol during bolus administration of intravenous contrast. Multiplanar CT image reconstructions and MIPs were obtained to evaluate the vascular anatomy. RADIATION DOSE REDUCTION: This exam was performed according to the departmental dose-optimization program which includes automated exposure control, adjustment of the mA and/or kV according to patient size and/or use of iterative reconstruction technique. CONTRAST:  33mL OMNIPAQUE IOHEXOL 350 MG/ML SOLN COMPARISON:  Chest x-ray from earlier in the same day. FINDINGS: Cardiovascular: Atherosclerotic calcifications of the thoracic aorta are noted. Changes of prior TAVR are seen. Heart is at the upper limits of normal in size. Coronary calcifications are seen. The pulmonary  artery shows a normal branching pattern. No filling defect to suggest pulmonary embolism is noted. Changes of prior coronary bypass grafting are noted. Mediastinum/Nodes: Thoracic inlet is within normal limits. No hilar or mediastinal adenopathy is noted. The esophagus is within normal limits. Lungs/Pleura: The right lung is well aerated without focal infiltrate or sizable effusion. Small left effusion is noted as well as for left basilar consolidation which may represent early infiltrate. No other focal abnormality in the left lung is seen. Upper Abdomen: Cholelithiasis is noted without complicating factors. The remainder of the upper abdomen is within normal limits. Musculoskeletal: Degenerative changes of the thoracic spine are noted. No acute rib abnormality is noted. Review of the MIP images confirms the above findings. IMPRESSION: No evidence of pulmonary emboli. Left basilar consolidation and small left effusion. Cholelithiasis without complicating factors. Aortic Atherosclerosis (ICD10-I70.0). Electronically Signed   By: 73m M.D.   On: 12/31/2021 00:10   DG Chest 2 View  Result Date: 12/30/2021 CLINICAL DATA:  Chest pain. EXAM: CHEST - 2 VIEW COMPARISON:  Chest XRs, most recently 05/15/2020. FINDINGS: Borderline enlargement of the cardiac silhouette. Aortic valve replacement and arch calcifications. Coronary bypass changes. Lungs are mildly hypoinflated. No focal consolidation  or mass. No pleural effusion or pneumothorax. Median sternotomy. No acute osseous abnormality. IMPRESSION: 1. No acute cardiopulmonary process 2. Borderline cardiomegaly and postsurgical changes of AVR and CABG. Aortic Atherosclerosis (ICD10-I70.0). Electronically Signed   By: Roanna Banning M.D.   On: 12/30/2021 15:49        Scheduled Meds:  [START ON 01/01/2022] aspirin  81 mg Oral Daily   atorvastatin  80 mg Oral QHS   carvedilol  3.125 mg Oral BID WC   clopidogrel  75 mg Oral QHS   empagliflozin  25 mg Oral  Daily   furosemide  60 mg Oral Daily   insulin aspart  0-9 Units Subcutaneous TID WC   insulin glargine-yfgn  40 Units Subcutaneous Daily   Continuous Infusions:  heparin 1,300 Units/hr (12/31/21 0009)     LOS: 1 day     Silvano Bilis, MD [facility]   If 7PM-7AM, please contact night-coverage [email or web address] Password [facility] 12/31/2021, 11:05 AM

## 2021-12-31 NOTE — Care Management Important Message (Signed)
Important Message  Patient Details  Name: Adam Ferguson MRN: [id number] Date of Birth: 10/11/1945   Medicare Important Message Given:  N/A - LOS <3 / Initial given by admissions     [name] 12/31/2021, 11:49 AM

## 2021-12-31 NOTE — Progress Notes (Signed)
*  PRELIMINARY RESULTS* Echocardiogram 2D Echocardiogram has been performed.  Cristela Blue 12/31/2021, 2:35 PM

## 2021-12-31 NOTE — Progress Notes (Signed)
Date and time results received: 12/31/21    Test: Troponin Critical Value: 1532  Name of Provider Notified: Jodie Echevaria NP  Orders Received? Or Actions Taken?: see MAR.

## 2021-12-31 NOTE — Plan of Care (Signed)
  Problem: Education: Goal: Understanding of cardiac disease, CV risk reduction, and recovery process will improve Outcome: Progressing Goal: Individualized Educational Video(s) Outcome: Progressing   Problem: Activity: Goal: Ability to tolerate increased activity will improve Outcome: Progressing   Problem: Education: Goal: Knowledge of General Education information will improve Description: Including pain rating scale, medication(s)/side effects and non-pharmacologic comfort measures Outcome: Progressing   Problem: Health Behavior/Discharge Planning: Goal: Ability to manage health-related needs will improve Outcome: Progressing

## 2022-01-01 LAB — BASIC METABOLIC PANEL
Anion gap: 7 (ref 5–15)
BUN: 27 mg/dL — ABNORMAL HIGH (ref 8–23)
CO2: 27 mmol/L (ref 22–32)
Calcium: 8.5 mg/dL — ABNORMAL LOW (ref 8.9–10.3)
Chloride: 107 mmol/L (ref 98–111)
Creatinine, Ser: 1.32 mg/dL — ABNORMAL HIGH (ref 0.61–1.24)
GFR, Estimated: 56 mL/min — ABNORMAL LOW (ref >60)
Glucose, Bld: 114 mg/dL — ABNORMAL HIGH (ref 70–99)
Potassium: 3.6 mmol/L (ref 3.5–5.1)
Sodium: 141 mmol/L (ref 135–145)

## 2022-01-01 LAB — ECHOCARDIOGRAM COMPLETE
AR max vel: 1.63 cm2
AV Area VTI: 1.7 cm2
AV Area mean vel: 1.58 cm2
AV Mean grad: 4.3 mmHg
AV Peak grad: 7.2 mmHg
Ao pk vel: 1.34 m/s
Area-P 1/2: 2.85 cm2
Calc EF: 23.9 %
Height: 69 in
S' Lateral: 5.5 cm
Single Plane A2C EF: 24.1 %
Single Plane A4C EF: 27.5 %
Weight: 3680 oz

## 2022-01-01 LAB — CBC
HCT: 42.8 % (ref 39.0–52.0)
Hemoglobin: 14.1 g/dL (ref 13.0–17.0)
MCH: 31.3 pg (ref 26.0–34.0)
MCHC: 32.9 g/dL (ref 30.0–36.0)
MCV: 94.9 fL (ref 80.0–100.0)
Platelets: 176 10*3/uL (ref 150–400)
RBC: 4.51 MIL/uL (ref 4.22–5.81)
RDW: 12.9 % (ref 11.5–15.5)
WBC: 5.4 10*3/uL (ref 4.0–10.5)
nRBC: 0 % (ref 0.0–0.2)

## 2022-01-01 LAB — GLUCOSE, CAPILLARY: Glucose-Capillary: 116 mg/dL — ABNORMAL HIGH (ref 70–99)

## 2022-01-01 LAB — HEPARIN LEVEL (UNFRACTIONATED): Heparin Unfractionated: 0.47 IU/mL (ref 0.30–0.70)

## 2022-01-01 MED ORDER — ASPIRIN 81 MG PO CHEW: 81.0000 mg | CHEWABLE_TABLET | Freq: Every day | ORAL | 0 refills | Status: AC

## 2022-01-01 MED ORDER — ISOSORBIDE MONONITRATE ER 30 MG PO TB24
30.0000 mg | ORAL_TABLET | Freq: Every day | ORAL | Status: DC
  Administered 2022-01-01: 30 mg via ORAL
  Filled 2022-01-01: qty 1

## 2022-01-01 MED ORDER — ISOSORBIDE MONONITRATE ER 30 MG PO TB24: 30.0000 mg | ORAL_TABLET | Freq: Every day | ORAL | 0 refills | Status: AC

## 2022-01-01 NOTE — Plan of Care (Signed)

## 2022-01-01 NOTE — Progress Notes (Signed)
Patient is NSR bradycardia at 53, and 54.

## 2022-01-01 NOTE — Progress Notes (Signed)
ANTICOAGULATION CONSULT NOTE - Initial Consult  Pharmacy Consult for Heparin Indication: chest pain/ACS  No Known Allergies  Patient Measurements: Height: 5\' 9"  (175.3 cm) Weight: 104.3 kg (230 lb) IBW/kg (Calculated) : 70.7 Heparin Dosing Weight:  93.2 kg   Vital Signs: Temp: 97.8 F (36.6 C) (08/18 0448) BP: 112/60 (08/18 0448) Pulse Rate: 58 (08/18 0448)  Labs: Recent Labs    12/30/21 1506 12/30/21 1712 12/31/21 0753 12/31/21 1004 12/31/21 1324 12/31/21 1627 01/01/22 0524  HGB 16.4  --   --   --   --   --  14.1  HCT 48.5  --   --   --   --   --  42.8  PLT 211  --   --   --   --   --  176  HEPARINUNFRC  --   --  0.42  --   --  0.46 0.47  CREATININE 1.19  --   --   --   --   --  1.32*  TROPONINIHS 11   < > 1,532* 1,825* 1,824* 1,821*  --    < > = values in this interval not displayed.     Estimated Creatinine Clearance: 57.5 mL/min (A) (by C-G formula based on SCr of 1.32 mg/dL (H)).   Medical History: Past Medical History:  Diagnosis Date   CHF (congestive heart failure) (HCC)    Diabetes mellitus without complication (HCC)    Hypertension    MI (myocardial infarction) (HCC)     Medications:  Medications Prior to Admission  Medication Sig Dispense Refill Last Dose   ascorbic acid (VITAMIN C) 500 MG tablet Take 500 mg by mouth daily.   12/30/2021 at 0830   atorvastatin (LIPITOR) 80 MG tablet Take 80 mg by mouth at bedtime.   12/29/2021 at 1700   B Complex-C-Folic Acid (RENAL) 1 MG CAPS Take 1 capsule by mouth every evening.   12/29/2021 at 1700   carvedilol (COREG) 3.125 MG tablet Take 1 tablet (3.125 mg total) by mouth in the morning and at bedtime. 60 tablet 2 12/30/2021 at 0830   clopidogrel (PLAVIX) 75 MG tablet Take 1 tablet (75 mg total) by mouth every evening. 90 tablet 3 12/29/2021 at 1700   furosemide (LASIX) 20 MG tablet Take 3 tablets (60 mg total) by mouth daily. (Patient taking differently: Take 20 mg by mouth daily. Take along with one 40 mg tablet  for total 60 mg dose once daily) 270 tablet 0 12/30/2021 at 0830   furosemide (LASIX) 40 MG tablet Take 40 mg by mouth daily. Take along with one 20 mg tablet for total 60 mg dose once daily   12/30/2021 at 0830   insulin glargine (LANTUS) 100 UNIT/ML Solostar Pen Inject 40 Units into the skin at bedtime.   12/30/2021 at 0200   JARDIANCE 25 MG TABS tablet Take 25 mg by mouth daily.   12/30/2021 at 0830   metFORMIN (GLUCOPHAGE) 500 MG tablet Take 500 mg by mouth 2 (two) times daily.   12/30/2021 at 0830   Multiple Vitamins-Minerals (MULTIVITAMIN ADULTS 50+) TABS Take 1 tablet by mouth daily.   12/29/2021 at 1700   Omega-3 1000 MG CAPS Take 1,000 mg by mouth every 3 (three) days.   Past Week   Probiotic Product (PROBIOTIC-10) CHEW Chew 1 capsule by mouth every other day.   Past Week   sacubitril-valsartan (ENTRESTO) 49-51 MG Take 1 tablet by mouth in the morning and at bedtime.   12/30/2021 at 0830  Semaglutide, 1 MG/DOSE, (OZEMPIC, 1 MG/DOSE,) 4 MG/3ML SOPN Inject 1 mg into the skin once a week. Sunday   12/27/2021    Assessment: Pharmacy consulted to dose heparin in this 76 year old male admitted with ACS/NSTEMI.  Pt was on heparin 5000 units SQ Q8H , last dose on 8/16 @ 2247.  CrCl = 63.8 ml/min  Goal of Therapy:  Heparin level 0.3-0.7 units/ml Monitor platelets by anticoagulation protocol: Yes   Date/time aPTT/HL Comments 8/17@0753  HL 0.42 Therapeutic x 1 8/17@1627  HL 0.46 Therapeutic x 2 8/18@0524      HL 0.47           Therapeutic X 3   Plan:  Continue heparin infusion at 1300 units/hr Recheck heparin level with AM labs Continue to monitor H&H and platelets  Oyinkansola Truax D, PharmD Clinical Pharmacist 01/01/2022 6:37 AM

## 2022-01-01 NOTE — Discharge Summary (Signed)
Adam Ferguson LXB:[id number] DOB: 04-11-1946 DOA: 12/30/2021  PCP: Mick Sell, MD  Admit date: 12/30/2021 Discharge date: 01/01/2022  Time spent: 35 minutes  Recommendations for Outpatient Follow-up:  Cardiology f/u 1 week, advise check of bmp then     Discharge Diagnoses:  Principal Problem:   Chest pain Active Problems:   Type 2 diabetes mellitus without complication, with long-term current use of insulin (HCC)   Essential hypertension   CAD (coronary artery disease)   Obesity (BMI 30-39.9)   Hx of CABG   S/P TAVR (transcatheter aortic valve replacement)   Discharge Condition: stable  Diet recommendation: heart healthy  Filed Weights   12/30/21 1457 12/30/21 1730  Weight: 104.3 kg 104.3 kg    History of present illness:  From admission h and p Adam Ferguson is a 76 y.o. 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 Patient  states pain started after he finished eating. He states he was sitting at his desk and noted pain was substernal and felt like a soreness.  He notes no radiation, n/v/diaphoresis , no presyncope , but did note mild sob. He notes pain has resolved.  He notes currently  he feels fatigued.  He currently denies history of GERD.  He states having chest discomfort is unusual for him.   Hospital Course:  Patient presented with nstemi. Chest pain resolved. Troponins peaked at 1800. Hemodynamically stable. Treated with 48 hours of heparin as well as aspirin. Cardiology followed, decision made to defer cath. TTE shows stable severe hfref. Aspirin and imdur added to home medication regimen. Cardiology advises 1 week f/u with outpatient cardiologist. Advise repeat bmp at that visit given slight up-trend in creatinine. Patient ambulated without symptoms or  difficulty prior to discharge. I advised someone else transport him home but he elects to drive himself.   Procedures: none   Consultations: cardiology  Discharge Exam: Vitals:   01/01/22 0448 01/01/22 0816  BP: 112/60 138/70  Pulse: (!) 58 (!) 47  Resp: 18 16  Temp: 97.8 F (36.6 C) 97.9 F (36.6 C)  SpO2: 97% 98%    General exam: Appears calm and comfortable  Respiratory system: Clear to auscultation. Respiratory effort normal. Cardiovascular system: S1 & S2 heard, RRR. Soft systolic murmur Gastrointestinal system: Abdomen is nondistended, soft and nontender. No organomegaly or masses felt. Normal bowel sounds heard. Central nervous system: Alert and oriented. No focal neurological deficits. Extremities: Symmetric 5 x 5 power. Skin: No rashes, lesions or ulcers Psychiatry: Judgement and insight appear normal. Mood & affect appropriate.   Discharge Instructions   Discharge Instructions     Diet - low sodium heart healthy   Complete by: As directed    Increase activity slowly   Complete by: As directed       Allergies as of 01/01/2022   No Known Allergies      Medication List     TAKE these medications    ascorbic acid 500 MG tablet Commonly known as: VITAMIN C Take 500 mg by mouth daily.   aspirin 81 MG chewable tablet Chew 1 tablet (81 mg total) by mouth daily. Start taking on: January 02, 2022   atorvastatin 80 MG tablet Commonly known as: LIPITOR Take 80 mg by mouth at bedtime.   carvedilol 3.125 MG tablet  Commonly known as: COREG Take 1 tablet (3.125 mg total) by mouth in the morning and at bedtime.   clopidogrel 75 MG tablet Commonly known as: PLAVIX Take 1 tablet (75 mg total) by mouth every evening.   furosemide 20 MG tablet Commonly known as: Lasix Take 3 tablets (60 mg total) by mouth daily. What changed:  how much to take additional instructions   furosemide 40 MG tablet Commonly known as: LASIX Take 40 mg by mouth daily. Take  along with one 20 mg tablet for total 60 mg dose once daily What changed: Another medication with the same name was changed. Make sure you understand how and when to take each.   insulin glargine 100 UNIT/ML Solostar Pen Commonly known as: LANTUS Inject 40 Units into the skin at bedtime.   isosorbide mononitrate 30 MG 24 hr tablet Commonly known as: IMDUR Take 1 tablet (30 mg total) by mouth daily. Start taking on: January 02, 2022   Jardiance 25 MG Tabs tablet Generic drug: empagliflozin Take 25 mg by mouth daily.   metFORMIN 500 MG tablet Commonly known as: GLUCOPHAGE Take 500 mg by mouth 2 (two) times daily.   Multivitamin Adults 50+ Tabs Take 1 tablet by mouth daily.   Omega-3 1000 MG Caps Take 1,000 mg by mouth every 3 (three) days.   Ozempic (1 MG/DOSE) 4 MG/3ML Sopn Generic drug: Semaglutide (1 MG/DOSE) Inject 1 mg into the skin once a week. Sunday   Probiotic-10 Chew Chew 1 capsule by mouth every other day.   Renal 1 MG Caps Take 1 capsule by mouth every evening.   sacubitril-valsartan 49-51 MG Commonly known as: ENTRESTO Take 1 tablet by mouth in the morning and at bedtime.       No Known Allergies  Follow-up Information     Kowalski, Bruce J, MD. Go in 1 week(s).   Specialty: Cardiology Contact information: [address] [facility] [place] [address] [phone]                  The results of significant diagnostics from this hospitalization (including imaging, microbiology, ancillary and laboratory) are listed below for reference.    Significant Diagnostic Studies: ECHOCARDIOGRAM COMPLETE  Result Date: 01/01/2022    ECHOCARDIOGRAM REPORT   Patient Name:   Adam Ferguson Date of Exam: 12/31/2021 Medical Rec #:  [id number]     Height:       69.0 in Accession #:    [id number]    Weight:       230.0 lb Date of Birth:  02/24/1946     BSA:          2.192 m Patient Age:    76 years      BP:           95/61 mmHg  Patient Gender: M             HR:           57  bpm. Exam Location:  [facility] Procedure: 2D Echo, Cardiac Doppler, Color Doppler and Intracardiac            Opacification Agent Indications:     Chest pain R07.9  History:         Patient has prior history of Echocardiogram examinations, most                  recent 03/30/2020. CHF, Previous Myocardial Infarction; Risk  Factors:Diabetes and Hypertension.  Sonographer:     Cristela Blue Referring Phys:  [id number] [facility] MICHELLE TANG Diagnosing Phys: Marcina Millard MD  Sonographer Comments: Suboptimal apical window. IMPRESSIONS  1. Left ventricular ejection fraction, by estimation, is 20 to 25%. The left ventricle has severely decreased function. The left ventricle demonstrates global hypokinesis. The left ventricular internal cavity size was moderately dilated. Left ventricular diastolic parameters are consistent with Grade I diastolic dysfunction (impaired relaxation).  2. Right ventricular systolic function is normal. The right ventricular size is normal.  3. The mitral valve is normal in structure. No evidence of mitral valve regurgitation. No evidence of mitral stenosis.  4. TAVR. The aortic valve is normal in structure. Aortic valve regurgitation is trivial. Mild aortic valve stenosis.  5. The inferior vena cava is normal in size with greater than 50% respiratory variability, suggesting right atrial pressure of 3 mmHg. FINDINGS  Left Ventricle: Left ventricular ejection fraction, by estimation, is 20 to 25%. The left ventricle has severely decreased function. The left ventricle demonstrates global hypokinesis. Definity contrast agent was given IV to delineate the left ventricular endocardial borders. The left ventricular internal cavity size was moderately dilated. There is no left ventricular hypertrophy. Left ventricular diastolic parameters are consistent with Grade I diastolic dysfunction (impaired relaxation). Right Ventricle: The right ventricular  size is normal. No increase in right ventricular wall thickness. Right ventricular systolic function is normal. Left Atrium: Left atrial size was normal in size. Right Atrium: Right atrial size was normal in size. Pericardium: There is no evidence of pericardial effusion. Mitral Valve: The mitral valve is normal in structure. No evidence of mitral valve regurgitation. No evidence of mitral valve stenosis. Tricuspid Valve: The tricuspid valve is normal in structure. Tricuspid valve regurgitation is not demonstrated. No evidence of tricuspid stenosis. Aortic Valve: TAVR. The aortic valve is normal in structure. Aortic valve regurgitation is trivial. Mild aortic stenosis is present. Aortic valve mean gradient measures 4.3 mmHg. Aortic valve peak gradient measures 7.2 mmHg. Aortic valve area, by VTI measures 1.70 cm. Pulmonic Valve: The pulmonic valve was normal in structure. Pulmonic valve regurgitation is not visualized. No evidence of pulmonic stenosis. Aorta: The aortic root is normal in size and structure. Venous: The inferior vena cava is normal in size with greater than 50% respiratory variability, suggesting right atrial pressure of 3 mmHg. IAS/Shunts: No atrial level shunt detected by color flow Doppler.  LEFT VENTRICLE PLAX 2D LVIDd:         5.80 cm      Diastology LVIDs:         5.50 cm      LV e' medial:    3.70 cm/s LV PW:         1.20 cm      LV E/e' medial:  12.6 LV IVS:        1.20 cm      LV e' lateral:   4.46 cm/s LVOT diam:     2.00 cm      LV E/e' lateral: 10.5 LV SV:         46 LV SV Index:   21 LVOT Area:     3.14 cm  LV Volumes (MOD) LV vol d, MOD A2C: 101.0 ml LV vol d, MOD A4C: 108.0 ml LV vol s, MOD A2C: 76.7 ml LV vol s, MOD A4C: 78.3 ml LV SV MOD A2C:     24.3 ml LV SV MOD A4C:     108.0 ml LV  SV MOD BP:      25.1 ml RIGHT VENTRICLE RV Basal diam:  3.70 cm RV S prime:     7.07 cm/s TAPSE (M-mode): 1.3 cm LEFT ATRIUM             Index        RIGHT ATRIUM           Index LA diam:        4.60  cm 2.10 cm/m   RA Area:     22.90 cm LA Vol (A2C):   78.4 ml 35.76 ml/m  RA Volume:   76.50 ml  34.89 ml/m LA Vol (A4C):   72.8 ml 33.21 ml/m LA Biplane Vol: 75.7 ml 34.53 ml/m  AORTIC VALVE AV Area (Vmax):    1.63 cm AV Area (Vmean):   1.58 cm AV Area (VTI):     1.70 cm AV Vmax:           134.13 cm/s AV Vmean:          93.133 cm/s AV VTI:            0.268 m AV Peak Grad:      7.2 mmHg AV Mean Grad:      4.3 mmHg LVOT Vmax:         69.80 cm/s LVOT Vmean:        46.900 cm/s LVOT VTI:          0.145 m LVOT/AV VTI ratio: 0.54  AORTA Ao Root diam: 3.37 cm MITRAL VALVE                TRICUSPID VALVE MV Area (PHT): 2.85 cm     TR Peak grad:   7.4 mmHg MV Decel Time: 266 msec     TR Vmax:        136.00 cm/s MV E velocity: 46.70 cm/s MV A velocity: 111.00 cm/s  SHUNTS MV E/A ratio:  0.42         Systemic VTI:  0.14 m                             Systemic Diam: 2.00 cm Marcina Millard MD Electronically signed by Marcina Millard MD Signature Date/Time: 01/01/2022/8:41:57 AM    Final    CT Angio Chest Pulmonary Embolism (PE) W or WO Contrast  Result Date: 12/31/2021 CLINICAL DATA:  Substernal chest pain for 1 day, initial encounter EXAM: CT ANGIOGRAPHY CHEST WITH CONTRAST TECHNIQUE: Multidetector CT imaging of the chest was performed using the standard protocol during bolus administration of intravenous contrast. Multiplanar CT image reconstructions and MIPs were obtained to evaluate the vascular anatomy. RADIATION DOSE REDUCTION: This exam was performed according to the departmental dose-optimization program which includes automated exposure control, adjustment of the mA and/or kV according to patient size and/or use of iterative reconstruction technique. CONTRAST:  52mL OMNIPAQUE IOHEXOL 350 MG/ML SOLN COMPARISON:  Chest x-ray from earlier in the same day. FINDINGS: Cardiovascular: Atherosclerotic calcifications of the thoracic aorta are noted. Changes of prior TAVR are seen. Heart is at the upper limits  of normal in size. Coronary calcifications are seen. The pulmonary artery shows a normal branching pattern. No filling defect to suggest pulmonary embolism is noted. Changes of prior coronary bypass grafting are noted. Mediastinum/Nodes: Thoracic inlet is within normal limits. No hilar or mediastinal adenopathy is noted. The esophagus is within normal limits. Lungs/Pleura: The right lung is well aerated without focal infiltrate or  sizable effusion. Small left effusion is noted as well as for left basilar consolidation which may represent early infiltrate. No other focal abnormality in the left lung is seen. Upper Abdomen: Cholelithiasis is noted without complicating factors. The remainder of the upper abdomen is within normal limits. Musculoskeletal: Degenerative changes of the thoracic spine are noted. No acute rib abnormality is noted. Review of the MIP images confirms the above findings. IMPRESSION: No evidence of pulmonary emboli. Left basilar consolidation and small left effusion. Cholelithiasis without complicating factors. Aortic Atherosclerosis (ICD10-I70.0). Electronically Signed   By: Alcide Clever M.D.   On: 12/31/2021 00:10   DG Chest 2 View  Result Date: 12/30/2021 CLINICAL DATA:  Chest pain. EXAM: CHEST - 2 VIEW COMPARISON:  Chest XRs, most recently 05/15/2020. FINDINGS: Borderline enlargement of the cardiac silhouette. Aortic valve replacement and arch calcifications. Coronary bypass changes. Lungs are mildly hypoinflated. No focal consolidation or mass. No pleural effusion or pneumothorax. Median sternotomy. No acute osseous abnormality. IMPRESSION: 1. No acute cardiopulmonary process 2. Borderline cardiomegaly and postsurgical changes of AVR and CABG. Aortic Atherosclerosis (ICD10-I70.0). Electronically Signed   By: Roanna Banning M.D.   On: 12/30/2021 15:49    Microbiology: No results found for this or any previous visit (from the past 240 hour(s)).   Labs: Basic Metabolic Panel: Recent  Labs  Lab 12/30/21 1506 01/01/22 0524  NA 139 141  K 4.2 3.6  CL 103 107  CO2 25 27  GLUCOSE 195* 114*  BUN 19 27*  CREATININE 1.19 1.32*  CALCIUM 8.9 8.5*   Liver Function Tests: No results for input(s): "AST", "ALT", "ALKPHOS", "BILITOT", "PROT", "ALBUMIN" in the last 168 hours. No results for input(s): "LIPASE", "AMYLASE" in the last 168 hours. No results for input(s): "AMMONIA" in the last 168 hours. CBC: Recent Labs  Lab 12/30/21 1506 01/01/22 0524  WBC 5.6 5.4  HGB 16.4 14.1  HCT 48.5 42.8  MCV 93.4 94.9  PLT 211 176   Cardiac Enzymes: No results for input(s): "CKTOTAL", "CKMB", "CKMBINDEX", "TROPONINI" in the last 168 hours. BNP: BNP (last 3 results) Recent Labs    12/31/21 0753  BNP 223.6*    ProBNP (last 3 results) No results for input(s): "PROBNP" in the last 8760 hours.  CBG: Recent Labs  Lab 12/31/21 0731 12/31/21 1132 12/31/21 1638 12/31/21 2023 01/01/22 0816  GLUCAP 129* 124* 161* 90 116*       Signed:  Silvano Bilis MD.  [facility] 01/01/2022, 11:04 AM

## 2022-01-01 NOTE — TOC CM/SW Note (Signed)
Patient has orders to discharge home today. Chart reviewed. PCP is Clydie Braun, MD. On room air. No wounds. No TOC needs identified. CSW signing off.  Charlynn Court, CSW [phone]

## 2022-01-01 NOTE — Progress Notes (Signed)
[facility] Cardiology  SUBJECTIVE: Patient sitting on side of the bed, reports "doing well", denies chest pain or shortness of breath   Vitals:   12/31/21 2332 01/01/22 0431 01/01/22 0448 01/01/22 0816  BP: (!) 117/45 109/65 112/60 138/70  Pulse: (!) 53 (!) 49 (!) 58 (!) 47  Resp: 18 18 18 16   Temp: 98 F (36.7 C) 97.8 F (36.6 C) 97.8 F (36.6 C) 97.9 F (36.6 C)  TempSrc:    Oral  SpO2: 96% 95% 97% 98%  Weight:      Height:         Intake/Output Summary (Last 24 hours) at 01/01/2022 01/03/2022 Last data filed at 01/01/2022 0315 Gross per 24 hour  Intake 352.16 ml  Output 0 ml  Net 352.16 ml      PHYSICAL EXAM  General: Well developed, well nourished, in no acute distress HEENT:  Normocephalic and atramatic Neck:  No JVD.  Lungs: Clear bilaterally to auscultation and percussion. Heart: HRRR . Normal S1 and S2 without gallops or murmurs.  Abdomen: Bowel sounds are positive, abdomen soft and non-tender  Msk:  Back normal, normal gait. Normal strength and tone for age. Extremities: No clubbing, cyanosis or edema.   Neuro: Alert and oriented X 3. Psych:  Good affect, responds appropriately   LABS: Basic Metabolic Panel: Recent Labs    12/30/21 1506 01/01/22 0524  NA 139 141  K 4.2 3.6  CL 103 107  CO2 25 27  GLUCOSE 195* 114*  BUN 19 27*  CREATININE 1.19 1.32*  CALCIUM 8.9 8.5*   Liver Function Tests: No results for input(s): "AST", "ALT", "ALKPHOS", "BILITOT", "PROT", "ALBUMIN" in the last 72 hours. No results for input(s): "LIPASE", "AMYLASE" in the last 72 hours. CBC: Recent Labs    12/30/21 1506 01/01/22 0524  WBC 5.6 5.4  HGB 16.4 14.1  HCT 48.5 42.8  MCV 93.4 94.9  PLT 211 176   Cardiac Enzymes: No results for input(s): "CKTOTAL", "CKMB", "CKMBINDEX", "TROPONINI" in the last 72 hours. BNP: Invalid input(s): "POCBNP" D-Dimer: Recent Labs    12/30/21 1949  DDIMER 0.64*   Hemoglobin A1C: Recent Labs    12/30/21 1506  HGBA1C 6.5*   Fasting  Lipid Panel: Recent Labs    12/31/21 0753  CHOL 108  HDL 31*  LDLCALC 62  TRIG 74  CHOLHDL 3.5   Thyroid Function Tests: No results for input(s): "TSH", "T4TOTAL", "T3FREE", "THYROIDAB" in the last 72 hours.  Invalid input(s): "FREET3" Anemia Panel: No results for input(s): "VITAMINB12", "FOLATE", "FERRITIN", "TIBC", "IRON", "RETICCTPCT" in the last 72 hours.  CT Angio Chest Pulmonary Embolism (PE) W or WO Contrast  Result Date: 12/31/2021 CLINICAL DATA:  Substernal chest pain for 1 day, initial encounter EXAM: CT ANGIOGRAPHY CHEST WITH CONTRAST TECHNIQUE: Multidetector CT imaging of the chest was performed using the standard protocol during bolus administration of intravenous contrast. Multiplanar CT image reconstructions and MIPs were obtained to evaluate the vascular anatomy. RADIATION DOSE REDUCTION: This exam was performed according to the departmental dose-optimization program which includes automated exposure control, adjustment of the mA and/or kV according to patient size and/or use of iterative reconstruction technique. CONTRAST:  82mL OMNIPAQUE IOHEXOL 350 MG/ML SOLN COMPARISON:  Chest x-ray from earlier in the same day. FINDINGS: Cardiovascular: Atherosclerotic calcifications of the thoracic aorta are noted. Changes of prior TAVR are seen. Heart is at the upper limits of normal in size. Coronary calcifications are seen. The pulmonary artery shows a normal branching pattern. No filling defect to  suggest pulmonary embolism is noted. Changes of prior coronary bypass grafting are noted. Mediastinum/Nodes: Thoracic inlet is within normal limits. No hilar or mediastinal adenopathy is noted. The esophagus is within normal limits. Lungs/Pleura: The right lung is well aerated without focal infiltrate or sizable effusion. Small left effusion is noted as well as for left basilar consolidation which may represent early infiltrate. No other focal abnormality in the left lung is seen. Upper Abdomen:  Cholelithiasis is noted without complicating factors. The remainder of the upper abdomen is within normal limits. Musculoskeletal: Degenerative changes of the thoracic spine are noted. No acute rib abnormality is noted. Review of the MIP images confirms the above findings. IMPRESSION: No evidence of pulmonary emboli. Left basilar consolidation and small left effusion. Cholelithiasis without complicating factors. Aortic Atherosclerosis (ICD10-I70.0). Electronically Signed   By: Alcide Clever M.D.   On: 12/31/2021 00:10   DG Chest 2 View  Result Date: 12/30/2021 CLINICAL DATA:  Chest pain. EXAM: CHEST - 2 VIEW COMPARISON:  Chest XRs, most recently 05/15/2020. FINDINGS: Borderline enlargement of the cardiac silhouette. Aortic valve replacement and arch calcifications. Coronary bypass changes. Lungs are mildly hypoinflated. No focal consolidation or mass. No pleural effusion or pneumothorax. Median sternotomy. No acute osseous abnormality. IMPRESSION: 1. No acute cardiopulmonary process 2. Borderline cardiomegaly and postsurgical changes of AVR and CABG. Aortic Atherosclerosis (ICD10-I70.0). Electronically Signed   By: Roanna Banning M.D.   On: 12/30/2021 15:49     Echo pending  TELEMETRY: Sinus bradycardia:  ASSESSMENT AND PLAN:  Principal Problem:   Chest pain Active Problems:   Essential hypertension   CAD (coronary artery disease)   Type 2 diabetes mellitus without complication, with long-term current use of insulin (HCC)   Obesity (BMI 30-39.9)   Hx of CABG   S/P TAVR (transcatheter aortic valve replacement)    1.  NSTEMI (1532, 1825, 1824, 1821), no further chest pain, after lengthy discussion, patient in favor of conservative management versus cardiac catheterization 2.  Complex CAD, with recent cardiac catheterization at [facility] 07/16/2017 revealed Ludent ostial LAD, high-grade stenosis left main, ostial stenosis RCA, patent LIMA to LAD, occluded SVG to OM1, occluded SVG to PDA,  underwent complex PCI with atherectomy, overlapping drug-eluting stents left main and left circumflex 3.  Ischemic cardiomyopathy, EF 20-25% by 2D echocardiogram 03/30/2020 4.  Status post TAVR 04/2018 5.  Chronic systolic congestive heart failure, HFrEF, appears euvolemic, on low-dose carvedilol, furosemide, empagliflozin 6.  Sinus bradycardia, appears asymptomatic, on low-dose carvedilol  Recommendations  1.  Agree with overall current therapy 2.  Agree with conservative management at this time 3.  Defer cardiac catheterization per patient's wishes 4.  DC heparin 5.  Add isosorbide mononitrate 30 mg daily 6.  Ambulate patient today, if patient does well without recurrent chest pain, may discharge home, follow-up with Dr. Gwen Pounds in 1 week   Marcina Millard, MD, PhD, [facility] 01/01/2022 8:23 AM

## 2023-07-24 ENCOUNTER — Encounter: Payer: Self-pay | Admitting: Student in an Organized Health Care Education/Training Program

## 2023-07-24 ENCOUNTER — Inpatient Hospital Stay

## 2023-07-24 ENCOUNTER — Emergency Department

## 2023-07-24 ENCOUNTER — Inpatient Hospital Stay (HOSPITAL_COMMUNITY)
Admit: 2023-07-24 | Discharge: 2023-07-24 | Disposition: A | Discharge status: Home / Self Care | Attending: Student in an Organized Health Care Education/Training Program | Admitting: Student in an Organized Health Care Education/Training Program

## 2023-07-24 ENCOUNTER — Other Ambulatory Visit: Payer: Self-pay

## 2023-07-24 ENCOUNTER — Inpatient Hospital Stay
Admission: EM | Admit: 2023-07-24 | Discharge: 2023-08-01 | DRG: 871 | Disposition: A | Discharge status: Home Health | Attending: Internal Medicine | Admitting: Internal Medicine

## 2023-07-24 DIAGNOSIS — U071 COVID-19: Secondary | ICD-10-CM | POA: Diagnosis present

## 2023-07-24 DIAGNOSIS — J810 Acute pulmonary edema: Secondary | ICD-10-CM | POA: Diagnosis not present

## 2023-07-24 DIAGNOSIS — R0609 Other forms of dyspnea: Secondary | ICD-10-CM | POA: Diagnosis not present

## 2023-07-24 DIAGNOSIS — J159 Unspecified bacterial pneumonia: Secondary | ICD-10-CM | POA: Diagnosis present

## 2023-07-24 DIAGNOSIS — Z79899 Other long term (current) drug therapy: Secondary | ICD-10-CM

## 2023-07-24 DIAGNOSIS — F0390 Unspecified dementia without behavioral disturbance: Secondary | ICD-10-CM | POA: Diagnosis present

## 2023-07-24 DIAGNOSIS — Z7982 Long term (current) use of aspirin: Secondary | ICD-10-CM

## 2023-07-24 DIAGNOSIS — G928 Other toxic encephalopathy: Secondary | ICD-10-CM | POA: Diagnosis present

## 2023-07-24 DIAGNOSIS — R319 Hematuria, unspecified: Secondary | ICD-10-CM | POA: Diagnosis not present

## 2023-07-24 DIAGNOSIS — A419 Sepsis, unspecified organism: Secondary | ICD-10-CM

## 2023-07-24 DIAGNOSIS — R531 Weakness: Secondary | ICD-10-CM

## 2023-07-24 DIAGNOSIS — R6521 Severe sepsis with septic shock: Secondary | ICD-10-CM | POA: Diagnosis present

## 2023-07-24 DIAGNOSIS — I639 Cerebral infarction, unspecified: Secondary | ICD-10-CM | POA: Diagnosis not present

## 2023-07-24 DIAGNOSIS — N179 Acute kidney failure, unspecified: Secondary | ICD-10-CM | POA: Diagnosis not present

## 2023-07-24 DIAGNOSIS — I2489 Other forms of acute ischemic heart disease: Secondary | ICD-10-CM | POA: Diagnosis present

## 2023-07-24 DIAGNOSIS — I110 Hypertensive heart disease with heart failure: Secondary | ICD-10-CM | POA: Diagnosis present

## 2023-07-24 DIAGNOSIS — E1165 Type 2 diabetes mellitus with hyperglycemia: Secondary | ICD-10-CM | POA: Diagnosis present

## 2023-07-24 DIAGNOSIS — E785 Hyperlipidemia, unspecified: Secondary | ICD-10-CM | POA: Diagnosis present

## 2023-07-24 DIAGNOSIS — I509 Heart failure, unspecified: Secondary | ICD-10-CM | POA: Diagnosis not present

## 2023-07-24 DIAGNOSIS — E876 Hypokalemia: Secondary | ICD-10-CM | POA: Diagnosis present

## 2023-07-24 DIAGNOSIS — N400 Enlarged prostate without lower urinary tract symptoms: Secondary | ICD-10-CM | POA: Diagnosis present

## 2023-07-24 DIAGNOSIS — R0603 Acute respiratory distress: Secondary | ICD-10-CM | POA: Diagnosis present

## 2023-07-24 DIAGNOSIS — Z6835 Body mass index (BMI) 35.0-35.9, adult: Secondary | ICD-10-CM

## 2023-07-24 DIAGNOSIS — E669 Obesity, unspecified: Secondary | ICD-10-CM | POA: Diagnosis present

## 2023-07-24 DIAGNOSIS — Z953 Presence of xenogenic heart valve: Secondary | ICD-10-CM

## 2023-07-24 DIAGNOSIS — R29700 NIHSS score 0: Secondary | ICD-10-CM | POA: Diagnosis not present

## 2023-07-24 DIAGNOSIS — E872 Acidosis, unspecified: Secondary | ICD-10-CM

## 2023-07-24 DIAGNOSIS — E874 Mixed disorder of acid-base balance: Secondary | ICD-10-CM | POA: Diagnosis present

## 2023-07-24 DIAGNOSIS — A4189 Other specified sepsis: Secondary | ICD-10-CM | POA: Diagnosis present

## 2023-07-24 DIAGNOSIS — I2510 Atherosclerotic heart disease of native coronary artery without angina pectoris: Secondary | ICD-10-CM | POA: Diagnosis present

## 2023-07-24 DIAGNOSIS — T884XXA Failed or difficult intubation, initial encounter: Secondary | ICD-10-CM | POA: Diagnosis present

## 2023-07-24 DIAGNOSIS — I5023 Acute on chronic systolic (congestive) heart failure: Secondary | ICD-10-CM | POA: Diagnosis present

## 2023-07-24 DIAGNOSIS — J9620 Acute and chronic respiratory failure, unspecified whether with hypoxia or hypercapnia: Secondary | ICD-10-CM | POA: Diagnosis not present

## 2023-07-24 DIAGNOSIS — Z794 Long term (current) use of insulin: Secondary | ICD-10-CM

## 2023-07-24 DIAGNOSIS — J9621 Acute and chronic respiratory failure with hypoxia: Secondary | ICD-10-CM | POA: Diagnosis present

## 2023-07-24 DIAGNOSIS — N170 Acute kidney failure with tubular necrosis: Secondary | ICD-10-CM | POA: Diagnosis present

## 2023-07-24 DIAGNOSIS — Z7984 Long term (current) use of oral hypoglycemic drugs: Secondary | ICD-10-CM

## 2023-07-24 DIAGNOSIS — I252 Old myocardial infarction: Secondary | ICD-10-CM

## 2023-07-24 DIAGNOSIS — I255 Ischemic cardiomyopathy: Secondary | ICD-10-CM | POA: Diagnosis present

## 2023-07-24 DIAGNOSIS — E119 Type 2 diabetes mellitus without complications: Secondary | ICD-10-CM | POA: Diagnosis not present

## 2023-07-24 DIAGNOSIS — J9601 Acute respiratory failure with hypoxia: Secondary | ICD-10-CM | POA: Diagnosis not present

## 2023-07-24 DIAGNOSIS — Z951 Presence of aortocoronary bypass graft: Secondary | ICD-10-CM

## 2023-07-24 DIAGNOSIS — J9622 Acute and chronic respiratory failure with hypercapnia: Secondary | ICD-10-CM | POA: Diagnosis present

## 2023-07-24 DIAGNOSIS — Z7902 Long term (current) use of antithrombotics/antiplatelets: Secondary | ICD-10-CM

## 2023-07-24 DIAGNOSIS — J189 Pneumonia, unspecified organism: Secondary | ICD-10-CM | POA: Diagnosis not present

## 2023-07-24 DIAGNOSIS — E854 Organ-limited amyloidosis: Secondary | ICD-10-CM | POA: Diagnosis present

## 2023-07-24 DIAGNOSIS — Z7985 Long-term (current) use of injectable non-insulin antidiabetic drugs: Secondary | ICD-10-CM

## 2023-07-24 DIAGNOSIS — I680 Cerebral amyloid angiopathy: Secondary | ICD-10-CM | POA: Diagnosis present

## 2023-07-24 DIAGNOSIS — I63442 Cerebral infarction due to embolism of left cerebellar artery: Secondary | ICD-10-CM | POA: Diagnosis not present

## 2023-07-24 DIAGNOSIS — J90 Pleural effusion, not elsewhere classified: Secondary | ICD-10-CM

## 2023-07-24 DIAGNOSIS — Z515 Encounter for palliative care: Secondary | ICD-10-CM | POA: Diagnosis not present

## 2023-07-24 LAB — ECHOCARDIOGRAM COMPLETE
AR max vel: 1.16 cm2
AV Area VTI: 1.21 cm2
AV Area mean vel: 1.08 cm2
AV Mean grad: 7 mmHg
AV Peak grad: 12.6 mmHg
Ao pk vel: 1.77 m/s
Calc EF: 20.4 %
Height: 72 in
S' Lateral: 5.1 cm
Single Plane A2C EF: 23.5 %
Single Plane A4C EF: 22 %
Weight: 5600 [oz_av]

## 2023-07-24 LAB — COMPREHENSIVE METABOLIC PANEL
ALT: 43 U/L (ref 0–44)
AST: 50 U/L — ABNORMAL HIGH (ref 15–41)
Albumin: 4 g/dL (ref 3.5–5.0)
Alkaline Phosphatase: 61 U/L (ref 38–126)
Anion gap: 17 — ABNORMAL HIGH (ref 5–15)
BUN: 20 mg/dL (ref 8–23)
CO2: 20 mmol/L — ABNORMAL LOW (ref 22–32)
Calcium: 8.5 mg/dL — ABNORMAL LOW (ref 8.9–10.3)
Chloride: 99 mmol/L (ref 98–111)
Creatinine, Ser: 1.65 mg/dL — ABNORMAL HIGH (ref 0.61–1.24)
GFR, Estimated: 43 mL/min — ABNORMAL LOW (ref >60)
Glucose, Bld: 313 mg/dL — ABNORMAL HIGH (ref 70–99)
Potassium: 3.2 mmol/L — ABNORMAL LOW (ref 3.5–5.1)
Sodium: 136 mmol/L (ref 135–145)
Total Bilirubin: 1.2 mg/dL (ref 0.0–1.2)
Total Protein: 7.7 g/dL (ref 6.5–8.1)

## 2023-07-24 LAB — LACTIC ACID, PLASMA
Lactic Acid, Venous: 9 mmol/L (ref 0.5–1.9)
Lactic Acid, Venous: 2.1 mmol/L (ref 0.5–1.9)
Lactic Acid, Venous: 2.4 mmol/L (ref 0.5–1.9)
Lactic Acid, Venous: 1.2 mmol/L (ref 0.5–1.9)
Lactic Acid, Venous: 1.1 mmol/L (ref 0.5–1.9)
Lactic Acid, Venous: 1.1 mmol/L (ref 0.5–1.9)

## 2023-07-24 LAB — CBC WITH DIFFERENTIAL/PLATELET
Abs Immature Granulocytes: 0.03 10*3/uL (ref 0.00–0.07)
Basophils Absolute: 0.1 10*3/uL (ref 0.0–0.1)
Basophils Relative: 1 %
Eosinophils Absolute: 0.1 10*3/uL (ref 0.0–0.5)
Eosinophils Relative: 1 %
HCT: 57.2 % — ABNORMAL HIGH (ref 39.0–52.0)
Hemoglobin: 17.9 g/dL — ABNORMAL HIGH (ref 13.0–17.0)
Immature Granulocytes: 0 %
Lymphocytes Relative: 47 %
Lymphs Abs: 4.5 10*3/uL — ABNORMAL HIGH (ref 0.7–4.0)
MCH: 32.1 pg (ref 26.0–34.0)
MCHC: 31.3 g/dL (ref 30.0–36.0)
MCV: 102.7 fL — ABNORMAL HIGH (ref 80.0–100.0)
Monocytes Absolute: 1.2 10*3/uL — ABNORMAL HIGH (ref 0.1–1.0)
Monocytes Relative: 13 %
Neutro Abs: 3.5 10*3/uL (ref 1.7–7.7)
Neutrophils Relative %: 38 %
Platelets: 217 10*3/uL (ref 150–400)
RBC: 5.57 MIL/uL (ref 4.22–5.81)
RDW: 13 % (ref 11.5–15.5)
WBC: 9.4 10*3/uL (ref 4.0–10.5)
nRBC: 0 % (ref 0.0–0.2)

## 2023-07-24 LAB — BLOOD GAS, ARTERIAL
Acid-base deficit: 4.6 mmol/L — ABNORMAL HIGH (ref 0.0–2.0)
Acid-base deficit: 2.4 mmol/L — ABNORMAL HIGH (ref 0.0–2.0)
Bicarbonate: 22.9 mmol/L (ref 20.0–28.0)
Bicarbonate: 23.2 mmol/L (ref 20.0–28.0)
FIO2: 50 %
FIO2: 30 %
MECHVT: 500 mL
MECHVT: 500 mL
Mechanical Rate: 18
Mechanical Rate: 18
O2 Saturation: 99.8 %
O2 Saturation: 96.5 %
PEEP: 5 cmH2O
PEEP: 5 cmH2O
Patient temperature: 37
Patient temperature: 37
pCO2 arterial: 51 mmHg — ABNORMAL HIGH (ref 32–48)
pCO2 arterial: 42 mmHg (ref 32–48)
pH, Arterial: 7.26 — ABNORMAL LOW (ref 7.35–7.45)
pH, Arterial: 7.35 (ref 7.35–7.45)
pO2, Arterial: 117 mmHg — ABNORMAL HIGH (ref 83–108)
pO2, Arterial: 71 mmHg — ABNORMAL LOW (ref 83–108)

## 2023-07-24 LAB — MAGNESIUM: Magnesium: 2.1 mg/dL (ref 1.7–2.4)

## 2023-07-24 LAB — BASIC METABOLIC PANEL
Anion gap: 11 (ref 5–15)
BUN: 24 mg/dL — ABNORMAL HIGH (ref 8–23)
CO2: 20 mmol/L — ABNORMAL LOW (ref 22–32)
Calcium: 7.6 mg/dL — ABNORMAL LOW (ref 8.9–10.3)
Chloride: 106 mmol/L (ref 98–111)
Creatinine, Ser: 1.55 mg/dL — ABNORMAL HIGH (ref 0.61–1.24)
GFR, Estimated: 46 mL/min — ABNORMAL LOW (ref >60)
Glucose, Bld: 206 mg/dL — ABNORMAL HIGH (ref 70–99)
Potassium: 4.3 mmol/L (ref 3.5–5.1)
Sodium: 137 mmol/L (ref 135–145)

## 2023-07-24 LAB — RESP PANEL BY RT-PCR (RSV, FLU A&B, COVID)  RVPGX2
Influenza A by PCR: NEGATIVE
Influenza B by PCR: NEGATIVE
Resp Syncytial Virus by PCR: NEGATIVE
SARS Coronavirus 2 by RT PCR: POSITIVE — AB

## 2023-07-24 LAB — GLUCOSE, CAPILLARY
Glucose-Capillary: 256 mg/dL — ABNORMAL HIGH (ref 70–99)
Glucose-Capillary: 113 mg/dL — ABNORMAL HIGH (ref 70–99)
Glucose-Capillary: 168 mg/dL — ABNORMAL HIGH (ref 70–99)
Glucose-Capillary: 211 mg/dL — ABNORMAL HIGH (ref 70–99)
Glucose-Capillary: 223 mg/dL — ABNORMAL HIGH (ref 70–99)

## 2023-07-24 LAB — HEPARIN LEVEL (UNFRACTIONATED): Heparin Unfractionated: 0.59 [IU]/mL (ref 0.30–0.70)

## 2023-07-24 LAB — APTT: aPTT: 25 s (ref 24–36)

## 2023-07-24 LAB — PROTIME-INR
INR: 1 (ref 0.8–1.2)
Prothrombin Time: 13.7 s (ref 11.4–15.2)

## 2023-07-24 LAB — TROPONIN I (HIGH SENSITIVITY)
Troponin I (High Sensitivity): 50 ng/L — ABNORMAL HIGH (ref <18)
Troponin I (High Sensitivity): 427 ng/L (ref <18)
Troponin I (High Sensitivity): 1101 ng/L (ref <18)
Troponin I (High Sensitivity): 2648 ng/L (ref <18)
Troponin I (High Sensitivity): 2540 ng/L (ref <18)

## 2023-07-24 LAB — CBG MONITORING, ED: Glucose-Capillary: 308 mg/dL — ABNORMAL HIGH (ref 70–99)

## 2023-07-24 LAB — HEMOGLOBIN A1C
Hgb A1c MFr Bld: 7.3 % — ABNORMAL HIGH (ref 4.8–5.6)
Mean Plasma Glucose: 162.81 mg/dL

## 2023-07-24 LAB — BRAIN NATRIURETIC PEPTIDE: B Natriuretic Peptide: 1512 pg/mL — ABNORMAL HIGH (ref 0.0–100.0)

## 2023-07-24 LAB — STREP PNEUMONIAE URINARY ANTIGEN: Strep Pneumo Urinary Antigen: NEGATIVE

## 2023-07-24 LAB — MRSA NEXT GEN BY PCR, NASAL: MRSA by PCR Next Gen: DETECTED — AB

## 2023-07-24 MED ORDER — SODIUM CHLORIDE 0.9 % IV BOLUS (SEPSIS): 5000.0000 mL | Freq: Once | INTRAVENOUS | Status: DC

## 2023-07-24 MED ORDER — VANCOMYCIN HCL 2000 MG/400ML IV SOLN
2000.0000 mg | Freq: Once | INTRAVENOUS | Status: AC
  Administered 2023-07-24: 2000 mg via INTRAVENOUS
  Filled 2023-07-24 (×2): qty 400

## 2023-07-24 MED ORDER — SUCCINYLCHOLINE CHLORIDE 200 MG/10ML IV SOSY
100.0000 mg | PREFILLED_SYRINGE | Freq: Once | INTRAVENOUS | Status: AC
  Administered 2023-07-24: 100 mg via INTRAVENOUS

## 2023-07-24 MED ORDER — SODIUM CHLORIDE 0.9 % IV SOLN: 250.0000 mL | INTRAVENOUS | Status: DC

## 2023-07-24 MED ORDER — DOCUSATE SODIUM 50 MG/5ML PO LIQD: 100.0000 mg | Freq: Two times a day (BID) | ORAL | Status: DC | PRN

## 2023-07-24 MED ORDER — LACTATED RINGERS IV BOLUS (SEPSIS): 1000.0000 mL | Freq: Once | INTRAVENOUS | Status: DC

## 2023-07-24 MED ORDER — ETOMIDATE 2 MG/ML IV SOLN
20.0000 mg | Freq: Once | INTRAVENOUS | Status: AC
  Administered 2023-07-24: 20 mg via INTRAVENOUS

## 2023-07-24 MED ORDER — LACTATED RINGERS IV SOLN: 150.0000 mL/h | INTRAVENOUS | Status: DC

## 2023-07-24 MED ORDER — MIDAZOLAM HCL 2 MG/2ML IJ SOLN
1.0000 mg | INTRAMUSCULAR | Status: DC | PRN
  Administered 2023-07-24: 2 mg via INTRAVENOUS

## 2023-07-24 MED ORDER — LACTATED RINGERS IV BOLUS (SEPSIS): 400.0000 mL | Freq: Once | INTRAVENOUS | Status: DC

## 2023-07-24 MED ORDER — POLYETHYLENE GLYCOL 3350 17 G PO PACK: 17.0000 g | PACK | Freq: Every day | ORAL | Status: DC

## 2023-07-24 MED ORDER — VANCOMYCIN HCL 1500 MG/300ML IV SOLN
1500.0000 mg | INTRAVENOUS | Status: AC
  Administered 2023-07-24 – 2023-07-30 (×7): 1500 mg via INTRAVENOUS
  Filled 2023-07-24 (×7): qty 300

## 2023-07-24 MED ORDER — PROPOFOL 1000 MG/100ML IV EMUL
INTRAVENOUS | Status: AC
  Administered 2023-07-24: 20 ug/kg/min via INTRAVENOUS
  Filled 2023-07-24: qty 100

## 2023-07-24 MED ORDER — SODIUM CHLORIDE 0.9 % IV SOLN
2.0000 g | Freq: Two times a day (BID) | INTRAVENOUS | Status: DC
  Administered 2023-07-24 – 2023-07-26 (×4): 2 g via INTRAVENOUS
  Filled 2023-07-24 (×5): qty 12.5

## 2023-07-24 MED ORDER — VECURONIUM BROMIDE 10 MG IV SOLR
10.0000 mg | Freq: Once | INTRAVENOUS | Status: AC
  Administered 2023-07-24: 10 mg via INTRAVENOUS
  Filled 2023-07-24: qty 10

## 2023-07-24 MED ORDER — SODIUM CHLORIDE 0.9% FLUSH
10.0000 mL | Freq: Two times a day (BID) | INTRAVENOUS | Status: DC
Start: 2023-07-24 — End: 2023-08-01
  Administered 2023-07-24: 30 mL
  Administered 2023-07-25 – 2023-07-31 (×7): 10 mL

## 2023-07-24 MED ORDER — SODIUM CHLORIDE 0.9 % IV SOLN: 200.0000 mg | Freq: Once | INTRAVENOUS | Status: DC

## 2023-07-24 MED ORDER — PROPOFOL 1000 MG/100ML IV EMUL
5.0000 ug/kg/min | INTRAVENOUS | Status: DC
  Administered 2023-07-24 (×2): 30 ug/kg/min via INTRAVENOUS
  Administered 2023-07-24: 40 ug/kg/min via INTRAVENOUS
  Administered 2023-07-24: 30 ug/kg/min via INTRAVENOUS
  Administered 2023-07-24: 40 ug/kg/min via INTRAVENOUS
  Administered 2023-07-25 (×2): 30 ug/kg/min via INTRAVENOUS
  Filled 2023-07-24 (×5): qty 100

## 2023-07-24 MED ORDER — ORAL CARE MOUTH RINSE
15.0000 mL | OROMUCOSAL | Status: DC
  Administered 2023-07-24 – 2023-07-25 (×16): 15 mL via OROMUCOSAL

## 2023-07-24 MED ORDER — METRONIDAZOLE 500 MG/100ML IV SOLN: 500.0000 mg | Freq: Two times a day (BID) | INTRAVENOUS | Status: DC

## 2023-07-24 MED ORDER — SODIUM BICARBONATE 8.4 % IV SOLN
INTRAVENOUS | Status: AC
  Administered 2023-07-24: 50 meq via INTRAVENOUS
  Filled 2023-07-24: qty 50

## 2023-07-24 MED ORDER — INSULIN ASPART 100 UNIT/ML IJ SOLN
0.0000 [IU] | INTRAMUSCULAR | Status: DC
  Administered 2023-07-24: 3 [IU] via SUBCUTANEOUS
  Administered 2023-07-24: 2 [IU] via SUBCUTANEOUS
  Administered 2023-07-24: 3 [IU] via SUBCUTANEOUS
  Administered 2023-07-25: 2 [IU] via SUBCUTANEOUS
  Administered 2023-07-25: 1 [IU] via SUBCUTANEOUS
  Administered 2023-07-25: 2 [IU] via SUBCUTANEOUS
  Administered 2023-07-25: 3 [IU] via SUBCUTANEOUS
  Administered 2023-07-25: 2 [IU] via SUBCUTANEOUS
  Administered 2023-07-26: 1 [IU] via SUBCUTANEOUS
  Administered 2023-07-26: 2 [IU] via SUBCUTANEOUS
  Filled 2023-07-24 (×10): qty 1

## 2023-07-24 MED ORDER — HEPARIN BOLUS VIA INFUSION
4000.0000 [IU] | Freq: Once | INTRAVENOUS | Status: AC
  Administered 2023-07-24: 4000 [IU] via INTRAVENOUS
  Filled 2023-07-24: qty 4000

## 2023-07-24 MED ORDER — POTASSIUM CHLORIDE 10 MEQ/100ML IV SOLN
10.0000 meq | INTRAVENOUS | Status: AC
  Administered 2023-07-24 (×4): 10 meq via INTRAVENOUS
  Filled 2023-07-24 (×4): qty 100

## 2023-07-24 MED ORDER — CHLORHEXIDINE GLUCONATE CLOTH 2 % EX PADS
6.0000 | MEDICATED_PAD | Freq: Every day | CUTANEOUS | Status: DC
  Administered 2023-07-24 – 2023-07-28 (×5): 6 via TOPICAL

## 2023-07-24 MED ORDER — PANTOPRAZOLE SODIUM 40 MG IV SOLR
40.0000 mg | Freq: Every day | INTRAVENOUS | Status: DC
  Administered 2023-07-24 – 2023-07-25 (×2): 40 mg via INTRAVENOUS
  Filled 2023-07-24 (×2): qty 10

## 2023-07-24 MED ORDER — FUROSEMIDE 10 MG/ML IJ SOLN
40.0000 mg | Freq: Once | INTRAMUSCULAR | Status: AC
  Administered 2023-07-24: 40 mg via INTRAVENOUS
  Filled 2023-07-24: qty 4

## 2023-07-24 MED ORDER — SODIUM CHLORIDE 0.9 % IV SOLN
500.0000 mg | INTRAVENOUS | Status: DC
  Administered 2023-07-24 – 2023-07-26 (×3): 500 mg via INTRAVENOUS
  Filled 2023-07-24 (×3): qty 5

## 2023-07-24 MED ORDER — ACETAMINOPHEN 325 MG PO TABS
650.0000 mg | ORAL_TABLET | Freq: Four times a day (QID) | ORAL | Status: DC | PRN
  Administered 2023-07-24 (×2): 650 mg via NASOGASTRIC
  Filled 2023-07-24 (×2): qty 2

## 2023-07-24 MED ORDER — HEPARIN SODIUM (PORCINE) 5000 UNIT/ML IJ SOLN: 5000.0000 [IU] | Freq: Three times a day (TID) | INTRAMUSCULAR | Status: DC

## 2023-07-24 MED ORDER — FENTANYL 2500MCG IN NS 250ML (10MCG/ML) PREMIX INFUSION
0.0000 ug/h | INTRAVENOUS | Status: DC
  Administered 2023-07-24: 25 ug/h via INTRAVENOUS
  Administered 2023-07-24 (×2): 100 ug/h via INTRAVENOUS
  Filled 2023-07-24: qty 250

## 2023-07-24 MED ORDER — SODIUM CHLORIDE 0.9 % IV BOLUS (SEPSIS)
2500.0000 mL | Freq: Once | INTRAVENOUS | Status: AC
  Administered 2023-07-24: 2500 mL via INTRAVENOUS

## 2023-07-24 MED ORDER — POLYETHYLENE GLYCOL 3350 17 G PO PACK: 17.0000 g | PACK | Freq: Every day | ORAL | Status: DC | PRN

## 2023-07-24 MED ORDER — NOREPINEPHRINE 4 MG/250ML-% IV SOLN
2.0000 ug/min | INTRAVENOUS | Status: DC
  Administered 2023-07-24: 10 ug/min via INTRAVENOUS
  Administered 2023-07-24: 2 ug/min via INTRAVENOUS
  Administered 2023-07-25: 10 ug/min via INTRAVENOUS
  Administered 2023-07-25: 6 ug/min via INTRAVENOUS
  Filled 2023-07-24 (×5): qty 250

## 2023-07-24 MED ORDER — SODIUM CHLORIDE 0.9 % IV SOLN: 150.0000 mL/h | INTRAVENOUS | Status: DC

## 2023-07-24 MED ORDER — IPRATROPIUM-ALBUTEROL 0.5-2.5 (3) MG/3ML IN SOLN: 3.0000 mL | Freq: Four times a day (QID) | RESPIRATORY_TRACT | Status: DC | PRN

## 2023-07-24 MED ORDER — IOHEXOL 350 MG/ML SOLN
80.0000 mL | Freq: Once | INTRAVENOUS | Status: AC | PRN
  Administered 2023-07-24: 80 mL via INTRAVENOUS

## 2023-07-24 MED ORDER — HEPARIN (PORCINE) 25000 UT/250ML-% IV SOLN
1450.0000 [IU]/h | INTRAVENOUS | Status: DC
  Administered 2023-07-24 – 2023-07-25 (×2): 1650 [IU]/h via INTRAVENOUS
  Administered 2023-07-25: 1450 [IU]/h via INTRAVENOUS
  Filled 2023-07-24 (×3): qty 250

## 2023-07-24 MED ORDER — SODIUM CHLORIDE 0.9 % IV SOLN: 100.0000 mg | Freq: Every day | INTRAVENOUS | Status: DC

## 2023-07-24 MED ORDER — SODIUM CHLORIDE 0.9% FLUSH: 10.0000 mL | INTRAVENOUS | Status: DC | PRN

## 2023-07-24 MED ORDER — FENTANYL 2500MCG IN NS 250ML (10MCG/ML) PREMIX INFUSION
INTRAVENOUS | Status: AC
  Filled 2023-07-24: qty 250

## 2023-07-24 MED ORDER — DEXAMETHASONE SODIUM PHOSPHATE 10 MG/ML IJ SOLN
6.0000 mg | INTRAMUSCULAR | Status: DC
  Administered 2023-07-24 – 2023-07-31 (×8): 6 mg via INTRAVENOUS
  Filled 2023-07-24 (×2): qty 0.6
  Filled 2023-07-24 (×2): qty 1
  Filled 2023-07-24 (×3): qty 0.6
  Filled 2023-07-24 (×2): qty 1

## 2023-07-24 MED ORDER — SODIUM CHLORIDE 0.9 % IV SOLN
2.0000 g | Freq: Once | INTRAVENOUS | Status: AC
  Administered 2023-07-24: 2 g via INTRAVENOUS
  Filled 2023-07-24: qty 12.5

## 2023-07-24 MED ORDER — ACETAMINOPHEN 10 MG/ML IV SOLN
1000.0000 mg | Freq: Once | INTRAVENOUS | Status: AC
  Administered 2023-07-24: 1000 mg via INTRAVENOUS
  Filled 2023-07-24: qty 100

## 2023-07-24 MED ORDER — SODIUM BICARBONATE 8.4 % IV SOLN: 50.0000 meq | Freq: Once | INTRAVENOUS | Status: AC

## 2023-07-24 MED ORDER — DOCUSATE SODIUM 50 MG/5ML PO LIQD
100.0000 mg | Freq: Two times a day (BID) | ORAL | Status: DC
  Administered 2023-07-24: 100 mg
  Filled 2023-07-24: qty 10

## 2023-07-24 MED ORDER — ORAL CARE MOUTH RINSE: 15.0000 mL | OROMUCOSAL | Status: DC | PRN

## 2023-07-24 MED ORDER — PERFLUTREN LIPID MICROSPHERE
1.0000 mL | INTRAVENOUS | Status: AC | PRN
  Administered 2023-07-24: 2 mL via INTRAVENOUS

## 2023-07-24 NOTE — Consult Note (Signed)
 CARDIOLOGY CONSULT NOTE               Patient ID: Adam Ferguson MRN: [id number] DOB/AGE: 1946-01-20 78 y.o.  Admit date: 07/24/2023 Referring Physician Dr Raechel Chute local care Primary Physician Clydie Braun, MD primary Primary Cardiologist Paraschos Reason for Consultation respiratory failure cardiomyopathy elevated troponins  HPI: 78 year old male coronary disease coronary bypass surgery March 2019 TAVR for aortic stenosis 2019 hypertension diabetes systolic congestive heart failure cardiomyopathy ischemic hyperlipidemia obesity presents via EMS with acute dyspnea and hypoxemia patient was intubated and sedated in the emergency room with severe hypoxemia and found to have COVID and what appeared to be bilateral pneumonia presumably related to COVID patient had slightly elevated troponins EKG was nondiagnostic BNP was elevated at 1500.  Review of systems complete and found to be negative unless listed above     Past Medical History:  Diagnosis Date   CHF (congestive heart failure) (HCC)    Diabetes mellitus without complication (HCC)    Hypertension    MI (myocardial infarction) (HCC)     Past Surgical History:  Procedure Laterality Date   AORTIC VALVE REPLACEMENT (AVR)/CORONARY ARTERY BYPASS GRAFTING (CABG)     CATARACT EXTRACTION W/PHACO Right 08/03/2021   Procedure: CATARACT EXTRACTION PHACO AND INTRAOCULAR LENS PLACEMENT (IOC) RIGHT DIABETIC 9.61 01:37.1;  Surgeon: Nevada Crane, MD;  Location: [facility];  Service: Ophthalmology;  Laterality: Right;  Diabetic   CATARACT EXTRACTION W/PHACO Left 08/17/2021   Procedure: CATARACT EXTRACTION PHACO AND INTRAOCULAR LENS PLACEMENT (IOC) LEFT DIABETIC 3.33 00:33.1;  Surgeon: Nevada Crane, MD;  Location: [facility];  Service: Ophthalmology;  Laterality: Left;  Diabetic   STENT PLACEMENT VASCULAR ([facility] HX)     VALVE REPLACEMENT      Medications Prior to Admission  Medication Sig Dispense Refill  Last Dose/Taking   aspirin 81 MG chewable tablet Chew 1 tablet (81 mg total) by mouth daily. 90 tablet 0 07/23/2023   ascorbic acid (VITAMIN C) 500 MG tablet Take 500 mg by mouth daily.      atorvastatin (LIPITOR) 80 MG tablet Take 80 mg by mouth at bedtime.      B Complex-C-Folic Acid (RENAL) 1 MG CAPS Take 1 capsule by mouth every evening.      carvedilol (COREG) 3.125 MG tablet Take 1 tablet (3.125 mg total) by mouth in the morning and at bedtime. 60 tablet 2    clopidogrel (PLAVIX) 75 MG tablet Take 1 tablet (75 mg total) by mouth every evening. 90 tablet 3    furosemide (LASIX) 20 MG tablet Take 3 tablets (60 mg total) by mouth daily. (Patient taking differently: Take 20 mg by mouth daily. Take along with one 40 mg tablet for total 60 mg dose once daily) 270 tablet 0    furosemide (LASIX) 40 MG tablet Take 40 mg by mouth daily. Take along with one 20 mg tablet for total 60 mg dose once daily      insulin glargine (LANTUS) 100 UNIT/ML Solostar Pen Inject 40 Units into the skin at bedtime.      isosorbide mononitrate (IMDUR) 30 MG 24 hr tablet Take 1 tablet (30 mg total) by mouth daily. 90 tablet 0    JARDIANCE 25 MG TABS tablet Take 25 mg by mouth daily.      metFORMIN (GLUCOPHAGE) 500 MG tablet Take 500 mg by mouth 2 (two) times daily.      Multiple Vitamins-Minerals (MULTIVITAMIN ADULTS 50+) TABS Take 1 tablet by mouth daily.  Omega-3 1000 MG CAPS Take 1,000 mg by mouth every 3 (three) days.      Probiotic Product (PROBIOTIC-10) CHEW Chew 1 capsule by mouth every other day.      sacubitril-valsartan (ENTRESTO) 49-51 MG Take 1 tablet by mouth in the morning and at bedtime.      Semaglutide, 1 MG/DOSE, (OZEMPIC, 1 MG/DOSE,) 4 MG/3ML SOPN Inject 1 mg into the skin once a week. Sunday      Social History   Socioeconomic History   Marital status: Married    Spouse name: Not on file   Number of children: Not on file   Years of education: Not on file   Highest education level: Not on file   Occupational History   Not on file  Tobacco Use   Smoking status: Never   Smokeless tobacco: Never  Vaping Use   Vaping status: Never Used  Substance and Sexual Activity   Alcohol use: Not Currently   Drug use: Not Currently   Sexual activity: Not Currently  Other Topics Concern   Not on file  Social History Narrative   Not on file   Social Drivers of Health   Financial Resource Strain: Low Risk  (04/18/2023)   Received from [facility]   Overall Financial Resource Strain (CARDIA)    Difficulty of Paying Living Expenses: Not hard at all  Food Insecurity: No Food Insecurity (04/18/2023)   Received from [facility]   Hunger Vital Sign    Worried About Running Out of Food in the Last Year: Never true    Ran Out of Food in the Last Year: Never true  Transportation Needs: No Transportation Needs (04/18/2023)   Received from [facility] - Transportation    In the past 12 months, has lack of transportation kept you from medical appointments or from getting medications?: No    Lack of Transportation (Non-Medical): No  Physical Activity: Unknown (12/29/2017)   Received from [facility], [facility]   Exercise Vital Sign    Days of Exercise per Week: 3 days    Minutes of Exercise per Session: Not on file  Stress: No Stress Concern Present (12/29/2017)   Received from [facility], [facility] - Occupational Stress Questionnaire    Feeling of Stress : Only a little  Social Connections: Unknown (12/29/2017)   Received from [facility], [facility]   Social Connection and Isolation Panel [NHANES]    Frequency of Communication with Friends and Family: Not on file    Frequency of Social Gatherings with Friends and Family: Not on file    Attends Religious Services: Not on file    Active Member of Clubs or Organizations: Not on file    Attends Banker  Meetings: Not on file    Marital Status: Married  Intimate Partner Violence: Unknown (12/29/2017)   Received from [facility], [facility]   Humiliation, Afraid, Rape, and Kick questionnaire    Fear of Current or Ex-Partner: No    Emotionally Abused: Not on file    Physically Abused: Not on file    Sexually Abused: Not on file    Family History  Problem Relation Age of Onset   Other Neg Hx       Review of systems complete and found to be negative unless listed above      PHYSICAL EXAM  General: Well developed, well nourished, in no  acute distress HEENT:  Normocephalic and atramatic Neck:  No JVD.  Lungs: Clear bilaterally to auscultation and percussion. Heart: HRRR . Normal S1 and S2 without gallops or murmurs.  Abdomen: Bowel sounds are positive, abdomen soft and non-tender  Msk:  Back normal, normal gait. Normal strength and tone for age. Extremities: No clubbing, cyanosis or edema.   Neuro: Alert and oriented X 3. Psych:  Good affect, responds appropriately  Labs:   Lab Results  Component Value Date   WBC 9.4 07/24/2023   HGB 17.9 (H) 07/24/2023   HCT 57.2 (H) 07/24/2023   MCV 102.7 (H) 07/24/2023   PLT 217 07/24/2023    Recent Labs  Lab 07/24/23 0417  NA 136  K 3.2*  CL 99  CO2 20*  BUN 20  CREATININE 1.65*  CALCIUM 8.5*  PROT 7.7  BILITOT 1.2  ALKPHOS 61  ALT 43  AST 50*  GLUCOSE 313*   No results found for: "CKTOTAL", "CKMB", "CKMBINDEX", "TROPONINI"  Lab Results  Component Value Date   CHOL 108 12/31/2021   Lab Results  Component Value Date   HDL 31 (L) 12/31/2021   Lab Results  Component Value Date   LDLCALC 62 12/31/2021   Lab Results  Component Value Date   TRIG 74 12/31/2021   Lab Results  Component Value Date   CHOLHDL 3.5 12/31/2021   No results found for: "LDLDIRECT"    Radiology: DG Chest 1 View Result Date: 07/24/2023 CLINICAL DATA:  [id number] Nasogastric tube present [id number] Central line insertion site  infection, initial encounter [id number] EXAM: CHEST  1 VIEW COMPARISON:  July 24, 2023 FINDINGS: The cardiomediastinal silhouette is unchanged and enlarged in contour.Status post TAVR and median sternotomy. Interval placement of a RIGHT neck CVC. There is a somewhat curved contour or of the CVC with tip terminating over the upper heart contour. The tip is parallel with the TAVR. The enteric tube courses through the chest to the abdomen beyond the field-of-view. ETT tip terminates approximately 5 cm above the carina. Small LEFT pleural effusion. No pneumothorax. Patchy perihilar predominant opacities and diffuse interstitial prominence. LEFT retrocardiac opacity. IMPRESSION: 1. Interval placement of a RIGHT neck CVC. There is a somewhat curved contour of the distal CVC with tip terminating over the upper heart contour. The tip is parallel with the TAVR. This is in an uncertain anatomic location with differential considerations including placement within SVC or intra arterial placement. Recommend correlation with placement images and function. If further imaging is desired, a dedicated CT could help delineate the definitive position. 2. Small LEFT pleural effusion with LEFT retrocardiac opacity, favored to reflect atelectasis. 3. Patchy perihilar predominant opacities and diffuse interstitial prominence, similar in comparison prior and likely reflecting a combination of infection and edema. These results will be called to the ordering clinician or representative by the Radiologist Assistant, and communication documented in the PACS or [employer]. Electronically Signed   By: Meda Klinefelter M.D.   On: 07/24/2023 12:05   CT ABDOMEN PELVIS W CONTRAST Result Date: 07/24/2023 CLINICAL DATA:  78 year old male altered mental status, difficulty breathing and severe hypoxia. EXAM: CT ABDOMEN AND PELVIS WITH CONTRAST TECHNIQUE: Multidetector CT imaging of the abdomen and pelvis was performed using the standard protocol  following bolus administration of intravenous contrast. RADIATION DOSE REDUCTION: This exam was performed according to the departmental dose-optimization program which includes automated exposure control, adjustment of the mA and/or kV according to patient size and/or use of iterative reconstruction technique. CONTRAST:  80mL OMNIPAQUE IOHEXOL 350 MG/ML SOLN COMPARISON:  CTA chest today. FINDINGS: Lower chest: Detailed on CTA chest separately. Hepatobiliary: Small layering gallstones (series 5, image 28). Otherwise negative liver and gallbladder. No bile duct enlargement. Pancreas: Negative. Spleen: Negative. Adrenals/Urinary Tract: Negative adrenal glands and kidneys. Symmetric renal enhancement and contrast excretion. Diminutive ureters. Bladder decompressed by Foley catheter, but diffusely thick-walled. Stomach/Bowel: Redundant large bowel with retained stool mostly in the rectosigmoid colon. Much of the proximal large bowel is decompressed. Normal appendix on coronal image 48. Nondilated small bowel. Enteric tube terminates in the stomach stomach, satisfactory. Mild gastric air in fluid. Decompressed duodenum. No free air, free fluid, mesenteric inflammation. Vascular/Lymphatic: Extensive Aortoiliac calcified atherosclerosis. Major arterial structures in the abdomen and pelvis remain patent. Normal caliber abdominal aorta. Portal venous system is patent. No lymphadenopathy. Reproductive: Urethral catheter in place.  Prostatomegaly. Other: No pelvis free fluid. Musculoskeletal: Lower thoracic Diffuse idiopathic skeletal hyperostosis (DISH). No acute osseous abnormality identified. IMPRESSION: 1. Abnormal Chest CTA today reported separately. Satisfactory enteric tube placement in the stomach. 2. Cholelithiasis. No acute or inflammatory process in the abdomen. 3. Prostatomegaly. Bladder is decompressed by Foley catheter, but is diffusely thick-walled. Consider chronic bladder outlet obstruction versus cystitis,  UTI. 4.  Aortic Atherosclerosis (ICD10-I70.0). Electronically Signed   By: Odessa Fleming M.D.   On: 07/24/2023 08:01   CT Angio Chest Pulmonary Embolism (PE) W or WO Contrast Result Date: 07/24/2023 CLINICAL DATA:  78 year old male altered mental status, difficulty breathing and severe hypoxia. EXAM: CT ANGIOGRAPHY CHEST WITH CONTRAST TECHNIQUE: Multidetector CT imaging of the chest was performed using the standard protocol during bolus administration of intravenous contrast. Multiplanar CT image reconstructions and MIPs were obtained to evaluate the vascular anatomy. RADIATION DOSE REDUCTION: This exam was performed according to the departmental dose-optimization program which includes automated exposure control, adjustment of the mA and/or kV according to patient size and/or use of iterative reconstruction technique. CONTRAST:  80mL OMNIPAQUE IOHEXOL 350 MG/ML SOLN COMPARISON:  Neck CT, CT Abdomen and Pelvis today reported separately. Prior CTA chest 12/31/2021. FINDINGS: Cardiovascular: Good contrast bolus timing in the pulmonary arterial tree. Mild respiratory motion. No pulmonary artery filling defect identified. Previous TAVR, CABG. Calcified aortic atherosclerosis. Little contrast in the aorta. Heart size at the upper limits of normal. No pericardial effusion. Mediastinum/Nodes: No mediastinal mass or lymphadenopathy. Enteric tube courses through the esophagus. Lungs/Pleura: Intubated. Endotracheal tube tip in good position between the clavicles and carina. Below the ETT the bilateral major airways are patent. Bilateral lower lobe consolidation with superimposed small layering pleural effusions. The left pleural effusion however is chronically loculated with pleural thickening and enhancement (series 3, image 101) and not significantly changed from 2023. Superimposed bilateral middle lobe and upper lobe additional widespread peribronchial and lung opacity ranging from sub solid to solid. Upper Abdomen: CT Abdomen  and Pelvis reported separately. Musculoskeletal: Chronic sternotomy. Thoracic spine degeneration and Diffuse idiopathic skeletal hyperostosis (DISH). No acute osseous abnormality identified. Review of the MIP images confirms the above findings. IMPRESSION: 1. Negative for acute pulmonary embolus. 2. Severe Bilateral Pneumonia, all lobes affected but consolidation worst in both lower lobes. 3. Small layering right pleural effusion, and chronic left fibrothorax related effusion which is stable since 2023. 4. Satisfactory ET tube. CT Abdomen and Pelvis today reported separately. 5.  Aortic Atherosclerosis (ICD10-I70.0).  TAVR, CABG. Electronically Signed   By: Odessa Fleming M.D.   On: 07/24/2023 07:57   CT Soft Tissue Neck Wo Contrast Result Date: 07/24/2023 CLINICAL DATA:  78 year old male altered mental status, difficulty breathing and severe hypoxia. Intubated. EXAM: CT NECK WITHOUT CONTRAST TECHNIQUE: Multidetector CT imaging of the neck was performed following the standard protocol without intravenous contrast. RADIATION DOSE REDUCTION: This exam was performed according to the departmental dose-optimization program which includes automated exposure control, adjustment of the mA and/or kV according to patient size and/or use of iterative reconstruction technique. COMPARISON:  Head CT today. FINDINGS: Pharynx and larynx: Intubated and oral enteric tubes in place and have appropriate visible courses into the airway and esophagus, respectively. Superimposed mild retained fluid in the pharynx. Noncontrast parapharyngeal retropharyngeal spaces appear negative. Salivary glands: Negative noncontrast appearance. Thyroid: Negative. Lymph nodes: No evidence of cervical lymphadenopathy on this noncontrast exam. Vascular: Gas in the left subclavian and internal jugular veins is likely IV access related. Vascular patency is not evaluated in the absence of IV contrast. Calcified atherosclerosis in the neck and at the skull base.  Limited intracranial: Reported separately. Visualized orbits: Not included. Mastoids and visualized paranasal sinuses: Well aerated. Skeleton: Previous sternotomy. No acute osseous abnormality identified. Upper chest: Abnormal, CTA Chest today is reported separately. IMPRESSION: 1. Intubated and oral enteric tubes in place and appropriately course into the airway and esophagus respectively. 2. No other acute or inflammatory process identified in the noncontrast Neck. 3. Chest CTA today reported separately. Electronically Signed   By: Odessa Fleming M.D.   On: 07/24/2023 07:15   CT HEAD WO CONTRAST ( ) Result Date: 07/24/2023 CLINICAL DATA:  78 year old male altered mental status, difficulty breathing and severe hypoxia. EXAM: CT HEAD WITHOUT CONTRAST TECHNIQUE: Contiguous axial images were obtained from the base of the skull through the vertex without intravenous contrast. RADIATION DOSE REDUCTION: This exam was performed according to the departmental dose-optimization program which includes automated exposure control, adjustment of the mA and/or kV according to patient size and/or use of iterative reconstruction technique. COMPARISON:  None Available. FINDINGS: Brain: Cerebral volume is within normal limits for age. No midline shift, ventriculomegaly, mass effect, evidence of mass lesion, intracranial hemorrhage or evidence of cortically based acute infarction. Minimal to mild for age scattered cerebral white matter hypodensity. Hypodensity along the inferior right cerebellum appears to be streak artifact (sagittal image 29). Vascular: Calcified atherosclerosis at the skull base. No suspicious intracranial vascular hyperdensity. Skull: Intact.  No fracture identified. Sinuses/Orbits: Intubated. Minor sinus mucosal thickening. Tympanic cavities and mastoids are clear. Other: Partially visible endotracheal and oral enteric tubes. Associated fluid in the visible pharynx. No acute orbit or scalp soft tissue finding  identified. IMPRESSION: 1. No acute intracranial abnormality or acute traumatic injury identified. 2. Normal for age noncontrast CT appearance of the brain. 3. Intubated. Electronically Signed   By: Odessa Fleming M.D.   On: 07/24/2023 07:10   DG Chest Portable 1 View Result Date: 07/24/2023 CLINICAL DATA:  Post intubation. EXAM: PORTABLE CHEST 1 VIEW COMPARISON:  CTA chest 12/30/2021 FINDINGS: Electrical pad overlies the lower left chest. Overlying monitor wires. 4:40 a.m. ETT in place with tip 6.8 cm from the carina. NGT extends to the left in the stomach with the tip in the fundus. There is mild cardiomegaly. Perihilar vascular congestion and mild generalized interstitial edema are noted consistent with CHF or fluid overload, with small pleural effusions forming. There are hazy perihilar opacities on the right-greater-than-left which are probably due to ground-glass edema, less likely pneumonitis. No other focal infiltrate is seen. Stable mediastinum with aortic atherosclerosis. Old sternotomy and CABG changes. No new osseous findings. Thoracic spondylosis. IMPRESSION: 1.  ETT and NGT in place. 2. Cardiomegaly with perihilar vascular congestion and mild generalized interstitial edema consistent with CHF or fluid overload. 3. Hazy perihilar opacities on the right-greater-than-left are probably due to ground-glass edema, less likely pneumonitis. 4. Small pleural effusions. 5. Aortic atherosclerosis. Electronically Signed   By: Almira Bar M.D.   On: 07/24/2023 05:13    EKG: Pending  Echocardiogram pending  ASSESSMENT AND PLAN:  Acute hypoxic respiratory failure Bilateral community-acquired pneumonia thought to be related to COVID Acute pulmonary edema Acute on chronic congestive heart failure Septic shock secondary to COVID Elevated troponin possibly demand ischemia Aortic valve replacement TAVR Coronary bypass surgery x 3 Diabetes type 2 Ischemic cardiomyopathy . Respiratory failure possibly due to  COVID continue supportive care supplemental oxygen pressors inhalers broad-spectrum robotics therapy agree with critical care and pulmonary input Will lactic acidosis improving from 9 down back to essentially normal Acute renal insufficiency maintain adequate hydration consider follow-up with nephrology Elevated troponin probable demand ischemia will evaluate for non-STEMI continue heparin therapy Multivessel coronary disease including coronary bypass surgery x 3 continue conservative management Aortic valve replacement for aortic stenosis via TAVR bioprosthetic continue current therapy Chronic systolic congestive heart failure on medical therapy with Entresto Plavix Coreg Lasix Diabetes normally on Lantus semaglutide Jardiance currently on hold treated with insulin therapy for now Continue conservative cardiac input at this point     Signed: Alwyn Pea MD, 07/24/2023, 12:08 PM

## 2023-07-24 NOTE — ED Triage Notes (Signed)
 To ER from home via EMS for difficulty breathing. Sats 44% on room air. Wife recently admitted for infection. EMS reports wetness, pertulating, tripodding with pursed breathing. Arrives with cpap.

## 2023-07-24 NOTE — Progress Notes (Addendum)
 0700 Report received from night shift. Levophed drip titrated as needed for blood pressure. Propofol and Fentanyl also titrated as needed. 1115 Right internal jugular TLC placed per NP.Marland KitchenCxray show proper placement og TLRight internal jugular, ETT and OG.  1345 Temperature per bladder temperature probe.showing 101.1 Heparine started.  1516 Given Tylenol per order for increasing temperature. EKG,respiratory culture, MRSA swab done. EKG show long QTC interval. 1600 Patient's temperature still increasing-Ice packs placed  bilateral axillary areas. 1700 Temperature slowly decreasing. Order for cooling blankets. Equipment called for cooling blanket. 1800 Bed and gown changed Right internal jugular dressing changed.

## 2023-07-24 NOTE — Consult Note (Signed)
 PHARMACY CONSULT NOTE - ELECTROLYTES  Pharmacy Consult for Electrolyte Monitoring and Replacement   Recent Labs: Height: 6' (182.9 cm) Weight: (!) 158.8 kg (350 lb) IBW/kg (Calculated) : 77.6 Estimated Creatinine Clearance: 59.3 mL/min (A) (by C-G formula based on SCr of 1.65 mg/dL (H)). Potassium (mmol/L)  Date Value  07/24/2023 3.2 (L)   Magnesium (mg/dL)  Date Value  16/02/9603 2.1   Calcium (mg/dL)  Date Value  54/01/8118 8.5 (L)   Albumin (g/dL)  Date Value  14/78/2956 4.0   Sodium (mmol/L)  Date Value  07/24/2023 136    Assessment  Adam Ferguson is a 78 y.o. male presenting with acute respiratory failure. PMH significant for hypertension, diabetes, CHF, CAD status post bypass, TAVR . Pharmacy has been consulted to monitor and replace electrolytes.  Diet: NPO, intubated Pertinent medications: lasix 40mg  IV x 1 given on 3/9  Goal of Therapy: Electrolytes WNL  Plan:  K 3.2: Kcl IV x 4 Check BMP, Mg, Phos with AM labs  Thank you for allowing pharmacy to be a part of this patient's care.  Bettey Costa, PharmD Clinical Pharmacist 07/24/2023 8:05 AM

## 2023-07-24 NOTE — Procedures (Signed)
 Central Venous Catheter Insertion Procedure Note  Adam Ferguson  [id number]  May 19, 1945  Date:07/24/23  Time:12:25 PM   Provider Performing:Magddalene S Tukov-Yual   Procedure: Insertion of Non-tunneled Central Venous Catheter(36556) with US guidance (40981)   Indication(s) Medication administration and large volume IV fluids  Consent Risks of the procedure as well as the alternatives and risks of each were explained to the patient and/or caregiver.  Consent for the procedure was obtained and is signed in the bedside chart  Anesthesia Topical only with 1% lidocaine   Timeout Verified patient identification, verified procedure, site/side was marked, verified correct patient position, special equipment/implants available, medications/allergies/relevant history reviewed, required imaging and test results available.  Sterile Technique Maximal sterile technique including full sterile barrier drape, hand hygiene, sterile gown, sterile gloves, mask, hair covering, sterile ultrasound probe cover (if used).  Procedure Description Area of catheter insertion was cleaned with chlorhexidine and draped in sterile fashion.  With real-time ultrasound guidance a central venous catheter was placed into the right internal jugular vein. Nonpulsatile blood flow and easy flushing noted in all ports.  The catheter was sutured in place and sterile dressing applied.  Complications/Tolerance None; patient tolerated the procedure well. Chest X-ray is ordered to verify placement for internal jugular or subclavian cannulation.   Chest x-ray is not ordered for femoral cannulation.  EBL Minimal  Specimen(s) None Procedure performed under direct supervision of Dr.Dgyali.  Thatiana Renbarger S. [facility] ANP-BC Pulmonary and Critical Care Medicine [facility] Pager [phone] or [phone]  NB: This document was prepared using Dragon voice recognition software and may include unintentional dictation  errors.

## 2023-07-24 NOTE — Sepsis Progress Note (Signed)
 Elink following code sepsis

## 2023-07-24 NOTE — Progress Notes (Signed)
 Adam Ferguson called (daughter in Social worker).  Driving in from [facility].  I gave her a call at [phone].  His son and her will be here in a few hours and plan to come in the morning to speak to doctors and handle in paperwork.

## 2023-07-24 NOTE — H&P (Addendum)
 NAME:  Adam Ferguson, MRN:  [id number], DOB:  19-Jan-1946, LOS: 0 ADMISSION DATE:  07/24/2023,  CHIEF COMPLAINT:  Acute toxic respiratory failure    HPI  Adam Ferguson is a 78 year old Caucasian male with a past medical history that is significant for CAD s/p coronary artery bypass graft x 3, type 2 diabetes, congestive heart failure, aortic stenosis s/p TAVR hypertension, and hyperlipidemia who presented to the ED via EMS with complaints of acute dyspnea and hypoxemia.  Patient is currently intubated and sedated hence history is obtained from ED and EMS documentation.  Per EMS documentation, patient's wife called EMS because patient was having difficulty breathing.  Upon EMS arrival, patient's SpO2 was 44% on room air as he was placed on CPAP and transferred to the ED.  Of note, patient was exposed to COVID-19.  Patient lives with his wife who has dementia. ED Course: Upon arrival in the ED, patient's SpO2 was 44% on CPAP.  He was emergently intubated.  Postintubation, patient was difficult to oxygenate requiring paralytics.  ED workup showed bilateral pneumonia, hypokalemia, hyperglycemia, elevated BNP suggestive of acute CHF exacerbation and bilateral pleural effusions.  Postintubation, patient developed septic shock requiring pressors. Initial vital signs showed HR of  73 beats/minute, BP 102/91 mmHg, the RR 37 breaths/minute, and the oxygen saturation 47% on CPAP and a temperature of 97.11F (36.6C). Patient was tripoding, speaking in one-word sentences and anxious.  He was emergently intubated upon arrival in the ED.  Patient was extremely difficult intubation.    Pertinent Labs/Diagnostics Findings: Na+ 136/ K+ 3.2:  Glucose: 313 BUN/Cr.:  1.65/20, calcium: 8.5, AST/ALT: 15/43, WBC: 9.4 without bands or neutrophil predominance   Hgb/Hct: Plts: 17.9/57.06/2015 Lactic acid: 9.0 COVID PCR: Positive troponin: 50 BNP: 1512 ABG postintubation: pO2 117; pCO2 51; pH 7.26;  HCO3 22., %O2 Sat 99.8 on 100%  FIO2  CXR> LL consolidation, BL pleural effusions and Pulmonary edema CTH> Negative for acute CVA CTA Chest> Negative for PE but bilateral pneumonia, BL pleural effusions CT neck-soft tissues: negative CT Abd/pelvis> cholelithiasis and prostatomegaly; bladder wall thickening  Past Medical History   Past Medical History:  Diagnosis Date   CHF (congestive heart failure) (HCC)    Diabetes mellitus without complication (HCC)    Hypertension    MI (myocardial infarction) (HCC)     Social History   Socioeconomic History   Marital status: Married    Spouse name: Not on file   Number of children: Not on file   Years of education: Not on file   Highest education level: Not on file  Occupational History   Not on file  Tobacco Use   Smoking status: Never   Smokeless tobacco: Never  Vaping Use   Vaping status: Never Used  Substance and Sexual Activity   Alcohol use: Not Currently   Drug use: Not Currently   Sexual activity: Not Currently  Other Topics Concern   Not on file  Social History Narrative   Not on file   Social Drivers of Health   Financial Resource Strain: Low Risk  (04/18/2023)   Received from [facility]   Overall Financial Resource Strain (CARDIA)    Difficulty of Paying Living Expenses: Not hard at all  Food Insecurity: No Food Insecurity (04/18/2023)   Received [facility]   Hunger Vital Sign    Worried About Running Out of Food in the Last Year: Never true    Ran Out of Food in the Last  Year: Never true  Transportation Needs: No Transportation Needs (04/18/2023)   Received from [facility] - Transportation    In the past 12 months, has lack of transportation kept you from medical appointments or from getting medications?: No    Lack of Transportation (Non-Medical): No  Physical Activity: Unknown (12/29/2017)   Received from [facility]   Exercise Vital Sign    Days of  Exercise per Week: 3 days    Minutes of Exercise per Session: Not on file  Stress: No Stress Concern Present (12/29/2017)   Received from [facility] - Occupational Stress Questionnaire    Feeling of Stress : Only a little  Social Connections: Unknown (12/29/2017)   Received from [facility]   Social Connection and Isolation Panel [NHANES]    Frequency of Communication with Friends and Family: Not on file    Frequency of Social Gatherings with Friends and Family: Not on file    Attends Religious Services: Not on file    Active Member of Clubs or Organizations: Not on file    Attends Banker Meetings: Not on file    Marital Status: Married  Intimate Partner Violence: Unknown (12/29/2017)   Received from [facility]   Humiliation, Afraid, Rape, and Kick questionnaire    Fear of Current or Ex-Partner: No    Emotionally Abused: Not on file    Physically Abused: Not on file    Sexually Abused: Not on file     Significant Hospital Events   07/24/2023: ED with acute hypoxic respiratory failure due to COVID-19 infection  Consults:  Cardiology-Dr. Juliann Pares  Procedures:  Central venous catheter placed Foley catheter placed  Significant Diagnostic Tests:  CXR> LL consolidation, BL pleural effusions and Pulmonary edema CTH> Negative for acute CVA CTA Chest> Negative for PE but bilateral pneumonia, BL pleural effusions CT neck-soft tissues: negative CT Abd/pelvis> cholelithiasis and prostatomegaly; bladder wall thickening  Interim History / Subjective:    07/24/2023: as above  Micro Data:  : SARS-CoV-2 PCR> negative : Influenza PCR> negative : Blood culture x2> : Urine Culture> : MRSA PCR>>  : Strep pneumo urinary antigen> : Legionella urinary antigen>  Antimicrobials:  Vancomycin Cefepime Azithromycin Ceftriaxone Metronidazole  OBJECTIVE  Blood pressure (!)  133/47, pulse 84, temperature 97.9 F (36.6 C), temperature source Bladder, resp. rate 11, height 6' (1.829 m), weight (!) 158.8 kg, SpO2 99%.    Vent Mode: PRVC FiO2 (%):  [50 %-100 %] 50 % Set Rate:  [18 bmp] 18 bmp Vt Set:  [500 mL] 500 mL PEEP:  [5 cmH20-8 cmH20] 5 cmH20 Plateau Pressure:  [19 cmH20] 19 cmH20  No intake or output data in the 24 hours ending 07/24/23 0918 Filed Weights   07/24/23 0429  Weight: (!) 158.8 kg   Physical Examination  GENERAL:  78 year-old male;  critically ill  looking patient,  lying in the bed, intubated and sedated EYES: PEERLA. No scleral icterus. Extraocular muscles intact.  HEENT: Head atraumatic, normocephalic. Oropharynx and nasopharynx clear.  NECK:  No JVD, supple  LUNGS: BL breath sounds, diminished in the bases, synchronous with the vent. CARDIOVASCULAR: S1, S2 normal. No murmurs, rubs, or gallops.  ABDOMEN: Non-distended, BS X 4 quadrants EXTREMITIES: No swelling, venous stasis discoloration.  +2 Pulses palpable distally. NEUROLOGIC: Sedated, withdraws to noxious stimulus; +gag with suctioning.  SKIN: No obvious rash, lesion, or ulcer. Warm  to touch Labs/imaging that I havepersonally reviewed  (right click and "Reselect all SmartList Selections" daily)     Labs   CBC: Recent Labs  Lab 07/24/23 0417  WBC 9.4  NEUTROABS 3.5  HGB 17.9*  HCT 57.2*  MCV 102.7*  PLT 217    Basic Metabolic Panel: Recent Labs  Lab 07/24/23 0417 07/24/23 0747  NA 136  --   K 3.2*  --   CL 99  --   CO2 20*  --   GLUCOSE 313*  --   BUN 20  --   CREATININE 1.65*  --   CALCIUM 8.5*  --   MG  --  2.1   GFR: Estimated Creatinine Clearance: 59.3 mL/min (A) (by C-G formula based on SCr of 1.65 mg/dL (H)). Recent Labs  Lab 07/24/23 0416 07/24/23 0417 07/24/23 0747  WBC  --  9.4  --   LATICACIDVEN 9.0*  --  2.1*    Liver Function Tests: Recent Labs  Lab 07/24/23 0417  AST 50*  ALT 43  ALKPHOS 61  BILITOT 1.2  PROT 7.7  ALBUMIN 4.0    No results for input(s): "LIPASE", "AMYLASE" in the last 168 hours. No results for input(s): "AMMONIA" in the last 168 hours.  ABG    Component Value Date/Time   PHART 7.26 (L) 07/24/2023 0850   PCO2ART 51 (H) 07/24/2023 0850   PO2ART 117 (H) 07/24/2023 0850   HCO3 22.9 07/24/2023 0850   ACIDBASEDEF 4.6 (H) 07/24/2023 0850   O2SAT 99.8 07/24/2023 0850     Coagulation Profile: Recent Labs  Lab 07/24/23 0747  INR 1.0    Cardiac Enzymes: No results for input(s): "CKTOTAL", "CKMB", "CKMBINDEX", "TROPONINI" in the last 168 hours.  HbA1C: Hgb A1c MFr Bld  Date/Time Value Ref Range Status  12/30/2021 03:06 PM 6.5 (H) 4.8 - 5.6 % Final    Comment:    (NOTE) Pre diabetes:          5.7%-6.4%  Diabetes:              >6.4%  Glycemic control for   <7.0% adults with diabetes   03/29/2020 06:08 PM 6.6 (H) 4.8 - 5.6 % Final    Comment:    (NOTE) Pre diabetes:          5.7%-6.4%  Diabetes:              >6.4%  Glycemic control for   <7.0% adults with diabetes     CBG: Recent Labs  Lab 07/24/23 0442 07/24/23 0656  GLUCAP 308* 256*    Review of Systems:   Unable to obtain as patient is intubated and sedated.   Past Medical History  He,  has a past medical history of CHF (congestive heart failure) (HCC), Diabetes mellitus without complication (HCC), Hypertension, and MI (myocardial infarction) (HCC).   Surgical History    Past Surgical History:  Procedure Laterality Date   AORTIC VALVE REPLACEMENT (AVR)/CORONARY ARTERY BYPASS GRAFTING (CABG)     CATARACT EXTRACTION W/PHACO Right 08/03/2021   Procedure: CATARACT EXTRACTION PHACO AND INTRAOCULAR LENS PLACEMENT (IOC) RIGHT DIABETIC 9.61 01:37.1;  Surgeon: Nevada Crane, MD;  Location: [facility];  Service: Ophthalmology;  Laterality: Right;  Diabetic   CATARACT EXTRACTION W/PHACO Left 08/17/2021   Procedure: CATARACT EXTRACTION PHACO AND INTRAOCULAR LENS PLACEMENT (IOC) LEFT DIABETIC 3.33 00:33.1;   Surgeon: Nevada Crane, MD;  Location: [facility];  Service: Ophthalmology;  Laterality: Left;  Diabetic   STENT PLACEMENT  VASCULAR ([facility] HX)     VALVE REPLACEMENT       Social History   reports that he has never smoked. He has never used smokeless tobacco. He reports that he does not currently use alcohol. He reports that he does not currently use drugs.   Family History   His family history is negative for Other.   Allergies No Known Allergies   Home Medications  Prior to Admission medications   Medication Sig Start Date End Date Taking? Authorizing Provider  aspirin 81 MG chewable tablet Chew 1 tablet (81 mg total) by mouth daily. 01/02/22  Yes Wouk, Wilfred Curtis, MD  ascorbic acid (VITAMIN C) 500 MG tablet Take 500 mg by mouth daily.    [provider]  atorvastatin (LIPITOR) 80 MG tablet Take 80 mg by mouth at bedtime.    [provider]  B Complex-C-Folic Acid (RENAL) 1 MG CAPS Take 1 capsule by mouth every evening.    [provider]  carvedilol (COREG) 3.125 MG tablet Take 1 tablet (3.125 mg total) by mouth in the morning and at bedtime. 05/16/20 12/30/21  Narda Bonds, MD  clopidogrel (PLAVIX) 75 MG tablet Take 1 tablet (75 mg total) by mouth every evening. 09/22/20   Delma Freeze, FNP  furosemide (LASIX) 20 MG tablet Take 3 tablets (60 mg total) by mouth daily. Patient taking differently: Take 20 mg by mouth daily. Take along with one 40 mg tablet for total 60 mg dose once daily 05/16/20 12/30/21  Narda Bonds, MD  furosemide (LASIX) 40 MG tablet Take 40 mg by mouth daily. Take along with one 20 mg tablet for total 60 mg dose once daily 12/28/21   [provider]  insulin glargine (LANTUS) 100 UNIT/ML Solostar Pen Inject 40 Units into the skin at bedtime.    [provider]  isosorbide mononitrate (IMDUR) 30 MG 24 hr tablet Take 1 tablet (30 mg total) by mouth daily. 01/02/22   Wouk, Wilfred Curtis, MD  JARDIANCE 25 MG  TABS tablet Take 25 mg by mouth daily. 12/28/21   [provider]  metFORMIN (GLUCOPHAGE) 500 MG tablet Take 500 mg by mouth 2 (two) times daily. 12/28/21   [provider]  Multiple Vitamins-Minerals (MULTIVITAMIN ADULTS 50+) TABS Take 1 tablet by mouth daily.    [provider]  Omega-3 1000 MG CAPS Take 1,000 mg by mouth every 3 (three) days.    [provider]  Probiotic Product (PROBIOTIC-10) CHEW Chew 1 capsule by mouth every other day.    [provider]  sacubitril-valsartan (ENTRESTO) 49-51 MG Take 1 tablet by mouth in the morning and at bedtime.    [provider]  Semaglutide, 1 MG/DOSE, (OZEMPIC, 1 MG/DOSE,) 4 MG/3ML SOPN Inject 1 mg into the skin once a week. Sunday 11/09/21   [provider]      Active Hospital Problem list   See systems below  Assessment & Plan:  #Acute Hypoxic Respiratory Failure in the setting of: # Acute Pulmonary Edema #Acute CHF exacerbation #COVID-19 infection #Bilateral Community Pneumonia  #Bilateral pleural effusions.  -Full vent support PRVC/500/18/5/40% -VAP prevention protocol -PRN Duoneb -Daily SAT & SBT -Wean PEEP and FiO2 for sats greater than 90% -Post-intubation ABG and daily ABGs prn -Follow chest x-ray -prn fentanyl, propofol  for vent sedation and comfort.  -Keep RASS 0 to -1  -Sputum cultures and broad spectrum antibiotics -Hold off on diuresis in light of shock   # Septic shock  secondary to COVID-19 infection and  Pneumonia Initial interventions/workup included: Broad spectrum antibiotics:  Cefepime/ Vancomycin/ azithromycin pending cultures.  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 Patient is followed by [facility] cardiology. Unable to ascertain from wife if patient has been taking medications as prescribed. Takes entresto, clopidogrel, carvedilol,  and furosemide at baseline -Hold all home mediations in light of shock -Trend troponin -Echocardiogram ordered -hold outpatient lopressor due to hypotension, consider restarting as patient stabilizes -Diurese with the use of IV lasix  as renal function and hemodynamics tolerate -Cardiology consulted; awaiting input -Heparin infusion per NSTEMI protocol   Endo Type 2 DM #currently npo -Takes lantus, semaglutide and jardiance at home-Hold -Last HgA1C 6.5% in 2023; repeat pending -Sensitive SSI with BGTs   Best practice:  Diet:  NPO Pain/Anxiety/Delirium protocol (if indicated): Yes (RASS goal -1) VAP protocol (if indicated): Yes DVT prophylaxis: Subcutaneous Heparin GI prophylaxis: PPI Glucose control:  SSI Yes Central venous access:  Yes, and it is still needed Arterial line:  N/A Foley:  Yes, and it is still needed Mobility:  bed rest  PT consulted: N/A Last date of multidisciplinary goals of care discussion [07/24/2023 with wife and son via telephone] Code Status:  full code Disposition: ICU   Goals of care conversation with stepson  Adam Ferguson Patient has a biological son Nolin Grell.  Contact is unavailable but his stepson will find his contact information and provided to staff. Code Status Order: Full code for now until the son and the stepson have a conversation.  Primary Emergency Contact: Caulfield,joyce but she has dementia I spoke at  length with patient's stepson Adam Ferguson.  I updated him on patient's current status, pending diagnostics and plan of care.  Unfortunately patient's spouse is at home alone and the son had to call the police for a welfare check.  He lives in [place] and he will try to get here as soon as possible.  In the meantime he will monitor patient to remain a full code until he can get hold of his stepbrother who is the patient's biological son to make a decision regarding patient's CODE STATUS. Adam Ferguson phone number is [phone]  1531: Spoke with patient's biological son Hawkin Charo and his wife. Updated him on patient's status. He will try to come tonight. He wants the patient to remain a full code.  He can be reached at [phone] Critical care time: 45 minutes   Plan of care established with discussed with ICU attending Dr. Moshe Salisbury.  Tamara Kenyon S. [facility] ANP-BC Pulmonary and Critical Care Medicine [facility] Pager [phone] or [phone]  NB: This document was prepared using Dragon voice recognition software and may include unintentional dictation errors.

## 2023-07-24 NOTE — Consult Note (Signed)
 PHARMACY - ANTICOAGULATION CONSULT NOTE  Pharmacy Consult for Heparin Indication: chest pain/ACS  No Known Allergies  Patient Measurements: Height: 6' (182.9 cm) Weight: (!) 158.8 kg (350 lb) IBW/kg (Calculated) : 77.6 Heparin Dosing Weight: 115.5 kg  Vital Signs: Temp: 100.4 F (38 C) (03/09 1245) Temp Source: Bladder (03/09 0715) BP: 85/58 (03/09 1245) Pulse Rate: 71 (03/09 1245)  Labs: Recent Labs    07/24/23 0417 07/24/23 0747 07/24/23 1016  HGB 17.9*  --   --   HCT 57.2*  --   --   PLT 217  --   --   APTT  --  25  --   LABPROT  --  13.7  --   INR  --  1.0  --   CREATININE 1.65*  --   --   TROPONINIHS 50* 427* 1,101*    Estimated Creatinine Clearance: 59.3 mL/min (A) (by C-G formula based on SCr of 1.65 mg/dL (H)).   Medical History: Past Medical History:  Diagnosis Date   CHF (congestive heart failure) (HCC)    Diabetes mellitus without complication (HCC)    Hypertension    MI (myocardial infarction) (HCC)     Medications:  No history of chronic anticoagulant use PTA  Assessment: 78 year old Caucasian male with a past medical history that is significant for CAD s/p coronary artery bypass graft x 3, type 2 diabetes, congestive heart failure, aortic stenosis s/p TAVR hypertension, and hyperlipidemia who presented to the ED via EMS with complaints of acute dyspnea and hypoxemia. Patient is currently intubated and sedated hence history is obtained from ED and EMS documentation. Cardiology has been consulted due to elevated troponin levels [phone]. Pharmacy has been consulted to initiate and monitor continuous heparin infusion.  Baseline labs: aPTT 25sec, INR 1.0, Plts 217, Hgb 17.9  Goal of Therapy:  Heparin level 0.3-0.7 units/ml Monitor platelets by anticoagulation protocol: Yes   Plan:  Give 4000 units bolus x 1 Start heparin infusion at 1650 units/hr Check anti-Xa level in 8 hours and daily while on heparin Continue to monitor H&H and  platelets  Jancy Sprankle A Kimothy Kishimoto 07/24/2023,1:15 PM

## 2023-07-24 NOTE — ED Provider Notes (Signed)
 [facility] Provider Note    Event Date/Time   First MD Initiated Contact with Patient 07/24/23 0424     (approximate)   History   Respiratory Distress   HPI  Adam Ferguson is a 78 y.o. male with history of hypertension, diabetes, CHF, CAD status post bypass, TAVR who presents to the emergency department with acute respiratory failure.  Patient called EMS for shortness of breath.  EMS states that he denies any chest pain but was tripoding and hypoxic to 74% on room air.  Does not wear oxygen chronically.  They placed him on CPAP and sats continued to be as low as the mid 40s.  They gave him 1 albuterol treatment in route.  He told them that he was also concerned he could have COVID-19.  He lives at home with his wife who has dementia.  They do report that the patient stated he thought his blood sugar was low at home and took 2 glucose tablets.   History provided by EMS, level 5 caveat secondary to respiratory distress.    Past Medical History:  Diagnosis Date   CHF (congestive heart failure) (HCC)    Diabetes mellitus without complication (HCC)    Hypertension    MI (myocardial infarction) (HCC)     Past Surgical History:  Procedure Laterality Date   AORTIC VALVE REPLACEMENT (AVR)/CORONARY ARTERY BYPASS GRAFTING (CABG)     CATARACT EXTRACTION W/PHACO Right 08/03/2021   Procedure: CATARACT EXTRACTION PHACO AND INTRAOCULAR LENS PLACEMENT (IOC) RIGHT DIABETIC 9.61 01:37.1;  Surgeon: Nevada Crane, MD;  Location: [facility];  Service: Ophthalmology;  Laterality: Right;  Diabetic   CATARACT EXTRACTION W/PHACO Left 08/17/2021   Procedure: CATARACT EXTRACTION PHACO AND INTRAOCULAR LENS PLACEMENT (IOC) LEFT DIABETIC 3.33 00:33.1;  Surgeon: Nevada Crane, MD;  Location: [facility];  Service: Ophthalmology;  Laterality: Left;  Diabetic   STENT PLACEMENT VASCULAR ([facility] HX)     VALVE REPLACEMENT      MEDICATIONS:  Prior to Admission  medications   Medication Sig Start Date End Date Taking? Authorizing Provider  ascorbic acid (VITAMIN C) 500 MG tablet Take 500 mg by mouth daily.    [provider]  aspirin 81 MG chewable tablet Chew 1 tablet (81 mg total) by mouth daily. 01/02/22   Wouk, Wilfred Curtis, MD  atorvastatin (LIPITOR) 80 MG tablet Take 80 mg by mouth at bedtime.    [provider]  B Complex-C-Folic Acid (RENAL) 1 MG CAPS Take 1 capsule by mouth every evening.    [provider]  carvedilol (COREG) 3.125 MG tablet Take 1 tablet (3.125 mg total) by mouth in the morning and at bedtime. 05/16/20 12/30/21  Narda Bonds, MD  clopidogrel (PLAVIX) 75 MG tablet Take 1 tablet (75 mg total) by mouth every evening. 09/22/20   Delma Freeze, FNP  furosemide (LASIX) 20 MG tablet Take 3 tablets (60 mg total) by mouth daily. Patient taking differently: Take 20 mg by mouth daily. Take along with one 40 mg tablet for total 60 mg dose once daily 05/16/20 12/30/21  Narda Bonds, MD  furosemide (LASIX) 40 MG tablet Take 40 mg by mouth daily. Take along with one 20 mg tablet for total 60 mg dose once daily 12/28/21   [provider]  insulin glargine (LANTUS) 100 UNIT/ML Solostar Pen Inject 40 Units into the skin at bedtime.    [provider]  isosorbide mononitrate (IMDUR) 30 MG 24 hr tablet  Take 1 tablet (30 mg total) by mouth daily. 01/02/22   Wouk, Wilfred Curtis, MD  JARDIANCE 25 MG TABS tablet Take 25 mg by mouth daily. 12/28/21   [provider]  metFORMIN (GLUCOPHAGE) 500 MG tablet Take 500 mg by mouth 2 (two) times daily. 12/28/21   [provider]  Multiple Vitamins-Minerals (MULTIVITAMIN ADULTS 50+) TABS Take 1 tablet by mouth daily.    [provider]  Omega-3 1000 MG CAPS Take 1,000 mg by mouth every 3 (three) days.    [provider]  Probiotic Product (PROBIOTIC-10) CHEW Chew 1 capsule by mouth every other day.    [provider]   sacubitril-valsartan (ENTRESTO) 49-51 MG Take 1 tablet by mouth in the morning and at bedtime.    [provider]  Semaglutide, 1 MG/DOSE, (OZEMPIC, 1 MG/DOSE,) 4 MG/3ML SOPN Inject 1 mg into the skin once a week. Sunday 11/09/21   [provider]    Physical Exam   Triage Vital Signs: ED Triage Vitals  Encounter Vitals Group     BP 07/24/23 0415 (!) 102/91     Systolic BP Percentile --      Diastolic BP Percentile --      Pulse Rate 07/24/23 0415 (!) 108     Resp 07/24/23 0415 (!) 37     Temp --      Temp src --      SpO2 07/24/23 0415 (!) 47 %     Weight 07/24/23 0429 (!) 350 lb (158.8 kg)     Height 07/24/23 0429 6' (1.829 m)     Head Circumference --      Peak Flow --      Pain Score --      Pain Loc --      Pain Education --      Exclude from Growth Chart --     Most recent vital signs: Vitals:   07/24/23 0550 07/24/23 0555  BP: (!) 125/44 (!) 131/49  Pulse:    Resp: 18 18  Temp: (!) 100.4 F (38 C) 100.1 F (37.8 C)  SpO2: 99% 99%    CONSTITUTIONAL: Elderly, obese, minimally responsive with eyes open, in severe respiratory distress HEAD: Normocephalic, atraumatic EYES: Conjunctivae clear, pupils appear equal, sclera nonicteric ENT: normal nose; moist mucous membranes NECK: Supple, normal ROM, trachea midline CARD: Regular and tachycardic; S1 and S2 appreciated RESP: Patient in severe respiratory distress.  On CPAP with sats of 44%.  Rhonchorous breath sounds diffusely. ABD/GI: Distended, soft BACK: The back appears normal EXT: no deformity noted, no edema, no calf swelling SKIN: Skin is cool to touch, slightly diaphoretic, mottled extremities NEURO: Eyes open but does not answer questions or follow commands; GCS 6    ED Results / Procedures / Treatments   LABS: (all labs ordered are listed, but only abnormal results are displayed) Labs Reviewed  RESP PANEL BY RT-PCR (RSV, FLU A&B, COVID)  [id number] - Abnormal; Notable for the following  components:      Result Value   SARS Coronavirus 2 by RT PCR POSITIVE (*)    All other components within normal limits  CBC WITH DIFFERENTIAL/PLATELET - Abnormal; Notable for the following components:   Hemoglobin 17.9 (*)    HCT 57.2 (*)    MCV 102.7 (*)    Lymphs Abs 4.5 (*)    Monocytes Absolute 1.2 (*)    All other components within normal limits  COMPREHENSIVE METABOLIC PANEL - Abnormal; Notable for the following  components:   Potassium 3.2 (*)    CO2 20 (*)    Glucose, Bld 313 (*)    Creatinine, Ser 1.65 (*)    Calcium 8.5 (*)    AST 50 (*)    GFR, Estimated 43 (*)    Anion gap 17 (*)    All other components within normal limits  LACTIC ACID, PLASMA - Abnormal; Notable for the following components:   Lactic Acid, Venous 9.0 (*)    All other components within normal limits  BRAIN NATRIURETIC PEPTIDE - Abnormal; Notable for the following components:   B Natriuretic Peptide 1,512.0 (*)    All other components within normal limits  CBG MONITORING, ED - Abnormal; Notable for the following components:   Glucose-Capillary 308 (*)    All other components within normal limits  TROPONIN I (HIGH SENSITIVITY) - Abnormal; Notable for the following components:   Troponin I (High Sensitivity) 50 (*)    All other components within normal limits  CULTURE, BLOOD (ROUTINE X 2)  CULTURE, BLOOD (ROUTINE X 2)  CULTURE, RESPIRATORY W GRAM STAIN  CULTURE, BLOOD (ROUTINE X 2)  CULTURE, BLOOD (ROUTINE X 2)  LACTIC ACID, PLASMA  BLOOD GAS, ARTERIAL  LEGIONELLA PNEUMOPHILA SEROGP 1 UR AG  STREP PNEUMONIAE URINARY ANTIGEN  LACTIC ACID, PLASMA  LACTIC ACID, PLASMA  PROTIME-INR  APTT  MAGNESIUM  TROPONIN I (HIGH SENSITIVITY)     EKG:   Date: 07/24/2023 5:28 AM  Rate: 93  Rhythm: normal sinus rhythm  QRS Axis: normal  Intervals: normal  ST/T Wave abnormalities: normal  Conduction Disutrbances: none  Narrative Interpretation: LVH, left anterior fascicular block, QTc 542  ms     RADIOLOGY: My personal review and interpretation of imaging: Chest x-ray shows diffuse pulmonary edema.  I have personally reviewed all radiology reports.   DG Chest Portable 1 View Result Date: 07/24/2023 CLINICAL DATA:  Post intubation. EXAM: PORTABLE CHEST 1 VIEW COMPARISON:  CTA chest 12/30/2021 FINDINGS: Electrical pad overlies the lower left chest. Overlying monitor wires. 4:40 a.m. ETT in place with tip 6.8 cm from the carina. NGT extends to the left in the stomach with the tip in the fundus. There is mild cardiomegaly. Perihilar vascular congestion and mild generalized interstitial edema are noted consistent with CHF or fluid overload, with small pleural effusions forming. There are hazy perihilar opacities on the right-greater-than-left which are probably due to ground-glass edema, less likely pneumonitis. No other focal infiltrate is seen. Stable mediastinum with aortic atherosclerosis. Old sternotomy and CABG changes. No new osseous findings. Thoracic spondylosis. IMPRESSION: 1. ETT and NGT in place. 2. Cardiomegaly with perihilar vascular congestion and mild generalized interstitial edema consistent with CHF or fluid overload. 3. Hazy perihilar opacities on the right-greater-than-left are probably due to ground-glass edema, less likely pneumonitis. 4. Small pleural effusions. 5. Aortic atherosclerosis. Electronically Signed   By: Almira Bar M.D.   On: 07/24/2023 05:13     PROCEDURES:  Critical Care performed: Yes, see critical care procedure note(s)   CRITICAL CARE Performed by: Rochele Raring   Total critical care time: 60 minutes  Critical care time was exclusive of separately billable procedures and treating other patients.  Critical care was necessary to treat or prevent imminent or life-threatening deterioration.  Critical care was time spent personally by me on the following activities: development of treatment plan with patient and/or surrogate as well as  nursing, discussions with consultants, evaluation of patient's response to treatment, examination of patient, obtaining history from patient or  surrogate, ordering and performing treatments and interventions, ordering and review of laboratory studies, ordering and review of radiographic studies, pulse oximetry and re-evaluation of patient's condition.   INTUBATION Performed by: Rochele Raring  Required items: required blood products, implants, devices, and special equipment available Patient identity confirmed: provided demographic data and hospital-assigned identification number Time out: Immediately prior to procedure a "time out" was called to verify the correct patient, procedure, equipment, support staff and site/side marked as required.  Indications: Respiratory failure  Intubation method: Glidescope Laryngoscopy   Preoxygenation: BVM  Sedatives: 20 mg IV etomidate Paralytic: 100 mg IV succinylcholine  Tube Size: 7.5 cuffed  Post-procedure assessment: chest rise and ETCO2 monitor Breath sounds: equal and absent over the epigastrium Tube secured with: ETT holder Chest x-ray interpreted by radiologist and me.  Chest x-ray findings: endotracheal tube in appropriate position  Patient tolerated the procedure well with no immediate complications.  Patient was a difficult intubation.  Had a hard time passing 7.5 ET tube through the vocal cords.  Met significant resistance.  Questionable foreign body seen.    Marland Kitchen1-3 Lead EKG Interpretation  Performed by: Ganesh Deeg, Layla Maw, DO Authorized by: Kerington Hildebrant, Layla Maw, DO     Interpretation: abnormal     ECG rate:  105   ECG rate assessment: tachycardic     Rhythm: sinus tachycardia     Ectopy: none     Conduction: normal       IMPRESSION / MDM / ASSESSMENT AND PLAN / ED COURSE  I reviewed the triage vital signs and the nursing notes.    Patient here in severe respiratory distress.  Sats 44% on CPAP.  The patient is on the cardiac  monitor to evaluate for evidence of arrhythmia and/or significant heart rate changes.   DIFFERENTIAL DIAGNOSIS (includes but not limited to):   CHF exacerbation, COPD exacerbation, pneumonia, pneumothorax, PE, viral URI, aspiration   Patient's presentation is most consistent with acute presentation with potential threat to life or bodily function.   PLAN: Patient here with altered mental status and severe respiratory distress with sats of 44% on CPAP.  Decision made to intubate patient emergently.  I attempted to intubate without sedation initially because I was concerned that patient was going to arrest due to his extremely low sats that were not improving with BVM.  Patient became slightly more responsive, responding to painful stimuli during the awake intubation and had small amount of emesis that was suctioned.  Sats improved to 83%.  Patient was then given etomidate and succinylcholine and intubation at that time was successful however it was quite difficult to pass the 7.5 endotracheal tube past the vocal cords.  My colleague Dr. Modesto Charon and respiratory therapy were at the bedside during intubation and thought that they saw something just below the vocal cords that could have been a foreign body.  EMS reports that he did take 2 glucose tablets prior to them getting to his house but they are not aware of any episodes of choking, aspiration.  Sats improved to the upper 90s on FiO2 of 100%, PEEP of 8, tidal volume 500, respiratory rate 20.  Will obtain labs, cultures, chest x-ray, EKG.  Also obtain CT scans to evaluate for possible aspirated foreign body/food/medication.  Will sedate with propofol.   MEDICATIONS GIVEN IN ED: Medications  propofol (DIPRIVAN) 1000 MG/100ML infusion (20 mcg/kg/min  110.1 kg (Adjusted) Intravenous Rate/Dose Change 07/24/23 0515)  fentaNYL in NS (56mcg/ml) infusion-PREMIX (75 mcg/hr Intravenous Rate/Dose Change 07/24/23  0459)  fentaNYL 10 mcg/ml infusion (0  mcg/hr  Stopped 07/24/23 0457)  docusate (COLACE) 50 MG/5ML liquid 100 mg (has no administration in time range)  polyethylene glycol (MIRALAX / GLYCOLAX) packet 17 g (has no administration in time range)  norepinephrine (LEVOPHED) 4mg  in (0.016 mg/mL) premix infusion (5 mcg/min Intravenous Rate/Dose Change 07/24/23 0511)  docusate (COLACE) 50 MG/5ML liquid 100 mg (has no administration in time range)  midazolam (VERSED) injection 1-2 mg (2 mg Intravenous Given 07/24/23 0510)  metroNIDAZOLE (FLAGYL) IVPB 500 mg (has no administration in time range)  vancomycin (VANCOREADY) IVPB 2000 mg/400 mL (has no administration in time range)  iohexol (OMNIPAQUE) 350 MG/ML injection 80 mL (has no administration in time range)  ceFEPIme (MAXIPIME) 2 g in sodium chloride 0.9 % 100 mL IVPB (has no administration in time range)  vancomycin (VANCOREADY) IVPB 1500 mg/300 mL (has no administration in time range)  sodium chloride 0.9 % bolus 2,500 mL (has no administration in time range)  succinylcholine (ANECTINE) syringe 100 mg (100 mg Intravenous Given 07/24/23 0421)  etomidate (AMIDATE) injection 20 mg (20 mg Intravenous Given 07/24/23 0421)  furosemide (LASIX) injection 40 mg (40 mg Intravenous Given 07/24/23 0456)  sodium bicarbonate injection 50 mEq (50 mEq Intravenous Given 07/24/23 0458)  vecuronium (NORCURON) injection 10 mg (10 mg Intravenous Given 07/24/23 0517)  acetaminophen (OFIRMEV) IV 1,000 mg (0 mg Intravenous Stopped 07/24/23 0555)  ceFEPIme (MAXIPIME) 2 g in sodium chloride 0.9 % 100 mL IVPB (0 g Intravenous Stopped 07/24/23 0608)     ED COURSE: Chest x-ray shows diffuse pulmonary edema.  BNP of 1500.  Will give IV Lasix.  Patient also has a fever of 101, lactate greater than 9 concerning for sepsis although some of this could be related to his severe respiratory failure, metabolic acidosis likely from his lactic acidosis with mixed respiratory acidosis.  Will give bicarb.  Will start 30 mL/kg IVF bolus  based on IBW given BMI > 35 (total 2.4L).  Will give broad-spectrum antibiotics.  Patient did test positive for COVID-19 which could be a contributory factor for his respiratory failure, sepsis today.   6:19 AM  Pt taken from ED to CT then directly to ICU.  ICU team to follow up on CT results.   CONSULTS: Discussed with Webb Silversmith, NP with critical care who agrees to admit to the ICU.   OUTSIDE RECORDS REVIEWED: Reviewed last cardiology note in October 2024.       FINAL CLINICAL IMPRESSION(S) / ED DIAGNOSES   Final diagnoses:  Acute on chronic congestive heart failure, unspecified heart failure type (HCC)  COVID-19  Acute sepsis (HCC)  Lactic acidosis  Acute respiratory failure with hypoxia and hypercapnia (HCC)  Respiratory acidosis with metabolic acidosis     Rx / DC Orders   ED Discharge Orders     None        Note:  This document was prepared using Dragon voice recognition software and may include unintentional dictation errors.   Zenovia Justman, Layla Maw, DO 07/24/23 [phone]

## 2023-07-24 NOTE — ED Notes (Signed)
 To CT

## 2023-07-24 NOTE — Progress Notes (Signed)
 Pharmacy Antibiotic Note  Adam Ferguson is a 78 y.o. male admitted on 07/24/2023 with sepsis from unknown source.  Pharmacy has been consulted for Cefepime and Vancomycin dosing for 7 days.  Plan: Cefepime 2 gm q12hr per indication & renal fxn.  Pt given Vancomycin 2000 mg once. Vancomycin 1500 mg IV Q 482.2 hrs. Goal AUC 400-550. Expected AUC: 483.2 SCr used: 1.65, Vd used: 0.5, BMI: 47.4  Pharmacy will continue to follow and will adjust abx dosing whenever warranted.  Temp (24hrs), Avg:100.4 F (38 C), Min:98.9 F (37.2 C), Max:101.2 F (38.4 C)   Recent Labs  Lab 07/24/23 0416 07/24/23 0417  WBC  --  9.4  CREATININE  --  1.65*  LATICACIDVEN 9.0*  --     Estimated Creatinine Clearance: 59.3 mL/min (A) (by C-G formula based on SCr of 1.65 mg/dL (H)).    No Known Allergies  Antimicrobials this admission: 3/09 Cefepime >> x 7 days 3/09 Flagyl >> x 7 days 3/09 Vancomycin >> x 7 days  Microbiology results: 3/09 BCx: Pending  Thank you for allowing pharmacy to be a part of this patient's care.  Otelia Sergeant, PharmD, [facility] 07/24/2023 5:44 AM

## 2023-07-24 NOTE — Progress Notes (Signed)
 CODE SEPSIS - PHARMACY COMMUNICATION  **Broad Spectrum Antibiotics should be administered within 1 hour of Sepsis diagnosis**  Time Code Sepsis Called/Page Received: 1308  Antibiotics Ordered: Cefepime, Flagyl, Vancomycin  Time of 1st antibiotic administration: 0556  Otelia Sergeant, PharmD, [facility] 07/24/2023 5:44 AM

## 2023-07-25 ENCOUNTER — Inpatient Hospital Stay

## 2023-07-25 DIAGNOSIS — E119 Type 2 diabetes mellitus without complications: Secondary | ICD-10-CM

## 2023-07-25 DIAGNOSIS — A419 Sepsis, unspecified organism: Secondary | ICD-10-CM | POA: Diagnosis not present

## 2023-07-25 DIAGNOSIS — N179 Acute kidney failure, unspecified: Secondary | ICD-10-CM

## 2023-07-25 DIAGNOSIS — G928 Other toxic encephalopathy: Secondary | ICD-10-CM

## 2023-07-25 DIAGNOSIS — J189 Pneumonia, unspecified organism: Secondary | ICD-10-CM | POA: Diagnosis not present

## 2023-07-25 DIAGNOSIS — R6521 Severe sepsis with septic shock: Secondary | ICD-10-CM

## 2023-07-25 DIAGNOSIS — J9601 Acute respiratory failure with hypoxia: Secondary | ICD-10-CM | POA: Diagnosis not present

## 2023-07-25 LAB — BLOOD GAS, ARTERIAL
Acid-base deficit: 7.3 mmol/L — ABNORMAL HIGH (ref 0.0–2.0)
Acid-base deficit: 6.8 mmol/L — ABNORMAL HIGH (ref 0.0–2.0)
Acid-base deficit: 3 mmol/L — ABNORMAL HIGH (ref 0.0–2.0)
Bicarbonate: 19.3 mmol/L — ABNORMAL LOW (ref 20.0–28.0)
Bicarbonate: 19.7 mmol/L — ABNORMAL LOW (ref 20.0–28.0)
Bicarbonate: 22.7 mmol/L (ref 20.0–28.0)
FIO2: 30 %
FIO2: 30 %
MECHVT: 500 mL
MECHVT: 500 mL
Mechanical Rate: 18
Mechanical Rate: 18
O2 Saturation: 97.3 %
O2 Saturation: 95.1 %
O2 Saturation: 98.1 %
PEEP: 5 cmH2O
PEEP: 5 cmH2O
Patient temperature: 37
Patient temperature: 37
Patient temperature: 37
pCO2 arterial: 42 mmHg (ref 32–48)
pCO2 arterial: 42 mmHg (ref 32–48)
pCO2 arterial: 42 mmHg (ref 32–48)
pH, Arterial: 7.27 — ABNORMAL LOW (ref 7.35–7.45)
pH, Arterial: 7.28 — ABNORMAL LOW (ref 7.35–7.45)
pH, Arterial: 7.34 — ABNORMAL LOW (ref 7.35–7.45)
pO2, Arterial: 83 mmHg (ref 83–108)
pO2, Arterial: 68 mmHg — ABNORMAL LOW (ref 83–108)
pO2, Arterial: 82 mmHg — ABNORMAL LOW (ref 83–108)

## 2023-07-25 LAB — CBC
HCT: 47.3 % (ref 39.0–52.0)
Hemoglobin: 15.4 g/dL (ref 13.0–17.0)
MCH: 31.9 pg (ref 26.0–34.0)
MCHC: 32.6 g/dL (ref 30.0–36.0)
MCV: 97.9 fL (ref 80.0–100.0)
Platelets: 193 10*3/uL (ref 150–400)
RBC: 4.83 MIL/uL (ref 4.22–5.81)
RDW: 13.4 % (ref 11.5–15.5)
WBC: 15.7 10*3/uL — ABNORMAL HIGH (ref 4.0–10.5)
nRBC: 0 % (ref 0.0–0.2)

## 2023-07-25 LAB — BASIC METABOLIC PANEL
Anion gap: 12 (ref 5–15)
BUN: 28 mg/dL — ABNORMAL HIGH (ref 8–23)
CO2: 20 mmol/L — ABNORMAL LOW (ref 22–32)
Calcium: 7.8 mg/dL — ABNORMAL LOW (ref 8.9–10.3)
Chloride: 106 mmol/L (ref 98–111)
Creatinine, Ser: 1.59 mg/dL — ABNORMAL HIGH (ref 0.61–1.24)
GFR, Estimated: 45 mL/min — ABNORMAL LOW (ref >60)
Glucose, Bld: 236 mg/dL — ABNORMAL HIGH (ref 70–99)
Potassium: 4.6 mmol/L (ref 3.5–5.1)
Sodium: 138 mmol/L (ref 135–145)

## 2023-07-25 LAB — PHOSPHORUS
Phosphorus: 3.8 mg/dL (ref 2.5–4.6)
Phosphorus: 2.8 mg/dL (ref 2.5–4.6)

## 2023-07-25 LAB — HEPARIN LEVEL (UNFRACTIONATED)
Heparin Unfractionated: 0.85 [IU]/mL — ABNORMAL HIGH (ref 0.30–0.70)
Heparin Unfractionated: 0.68 [IU]/mL (ref 0.30–0.70)

## 2023-07-25 LAB — COOXEMETRY PANEL
Carboxyhemoglobin: 1.9 % — ABNORMAL HIGH (ref 0.5–1.5)
Methemoglobin: <0.7 % (ref 0.0–1.5)
O2 Saturation: 83.8 %
Total hemoglobin: 15.6 g/dL (ref 12.0–16.0)
Total oxygen content: 82 %

## 2023-07-25 LAB — MAGNESIUM
Magnesium: 2.3 mg/dL (ref 1.7–2.4)
Magnesium: 2.3 mg/dL (ref 1.7–2.4)

## 2023-07-25 LAB — GLUCOSE, CAPILLARY
Glucose-Capillary: 115 mg/dL — ABNORMAL HIGH (ref 70–99)
Glucose-Capillary: 132 mg/dL — ABNORMAL HIGH (ref 70–99)
Glucose-Capillary: 163 mg/dL — ABNORMAL HIGH (ref 70–99)
Glucose-Capillary: 167 mg/dL — ABNORMAL HIGH (ref 70–99)
Glucose-Capillary: 196 mg/dL — ABNORMAL HIGH (ref 70–99)
Glucose-Capillary: 219 mg/dL — ABNORMAL HIGH (ref 70–99)

## 2023-07-25 LAB — TROPONIN I (HIGH SENSITIVITY): Troponin I (High Sensitivity): 2233 ng/L (ref <18)

## 2023-07-25 MED ORDER — FREE WATER: 30.0000 mL | Status: DC

## 2023-07-25 MED ORDER — DOCUSATE SODIUM 50 MG/5ML PO LIQD: 100.0000 mg | Freq: Two times a day (BID) | ORAL | Status: DC | PRN

## 2023-07-25 MED ORDER — INSULIN GLARGINE 100 UNIT/ML ~~LOC~~ SOLN
10.0000 [IU] | Freq: Two times a day (BID) | SUBCUTANEOUS | Status: DC
  Administered 2023-07-25 – 2023-08-01 (×15): 10 [IU] via SUBCUTANEOUS
  Filled 2023-07-25 (×16): qty 0.1

## 2023-07-25 MED ORDER — SODIUM CHLORIDE 0.9 % IV SOLN: INTRAVENOUS | Status: DC | PRN

## 2023-07-25 MED ORDER — PROSOURCE TF20 ENFIT COMPATIBL EN LIQD
60.0000 mL | Freq: Three times a day (TID) | ENTERAL | Status: DC
  Filled 2023-07-25: qty 60

## 2023-07-25 MED ORDER — ORAL CARE MOUTH RINSE: 15.0000 mL | OROMUCOSAL | Status: DC | PRN

## 2023-07-25 MED ORDER — ORAL CARE MOUTH RINSE
15.0000 mL | OROMUCOSAL | Status: DC
  Administered 2023-07-25 – 2023-07-26 (×5): 15 mL via OROMUCOSAL

## 2023-07-25 MED ORDER — POLYETHYLENE GLYCOL 3350 17 G PO PACK
17.0000 g | PACK | Freq: Every day | ORAL | Status: DC | PRN
  Filled 2023-07-25: qty 1

## 2023-07-25 MED ORDER — VITAL HIGH PROTEIN PO LIQD: 1000.0000 mL | ORAL | Status: DC

## 2023-07-25 MED ORDER — ACETAMINOPHEN 650 MG RE SUPP: 650.0000 mg | Freq: Four times a day (QID) | RECTAL | Status: DC | PRN

## 2023-07-25 NOTE — Plan of Care (Signed)
 The patient remains on AR-CIU at time of writing. The patient was extubated to BiPAP today. The patient is now AA+Ox4 and in no obvious acute distress. CVC and Foley catheter remain in place for now. No vasopressor requirement at this time.   Problem: Education: Goal: Knowledge of risk factors and measures for prevention of condition will improve Outcome: Progressing   Problem: Coping: Goal: Psychosocial and spiritual needs will be supported Outcome: Progressing   Problem: Respiratory: Goal: Will maintain a patent airway Outcome: Progressing Goal: Complications related to the disease process, condition or treatment will be avoided or minimized Outcome: Progressing   Problem: Fluid Volume: Goal: Hemodynamic stability will improve Outcome: Progressing   Problem: Clinical Measurements: Goal: Diagnostic test results will improve Outcome: Progressing Goal: Signs and symptoms of infection will decrease Outcome: Progressing   Problem: Respiratory: Goal: Ability to maintain adequate ventilation will improve Outcome: Progressing   Problem: Education: Goal: Knowledge of General Education information will improve Description: Including pain rating scale, medication(s)/side effects and non-pharmacologic comfort measures Outcome: Progressing   Problem: Health Behavior/Discharge Planning: Goal: Ability to manage health-related needs will improve Outcome: Progressing   Problem: Clinical Measurements: Goal: Ability to maintain clinical measurements within normal limits will improve Outcome: Progressing Goal: Will remain free from infection Outcome: Progressing Goal: Diagnostic test results will improve Outcome: Progressing Goal: Respiratory complications will improve Outcome: Progressing Goal: Cardiovascular complication will be avoided Outcome: Progressing   Problem: Activity: Goal: Risk for activity intolerance will decrease Outcome: Progressing   Problem: Nutrition: Goal:  Adequate nutrition will be maintained Outcome: Progressing   Problem: Coping: Goal: Level of anxiety will decrease Outcome: Progressing   Problem: Elimination: Goal: Will not experience complications related to bowel motility Outcome: Progressing Goal: Will not experience complications related to urinary retention Outcome: Progressing   Problem: Pain Managment: Goal: General experience of comfort will improve and/or be controlled Outcome: Progressing   Problem: Safety: Goal: Ability to remain free from injury will improve Outcome: Progressing   Problem: Skin Integrity: Goal: Risk for impaired skin integrity will decrease Outcome: Progressing   Problem: Education: Goal: Ability to describe self-care measures that may prevent or decrease complications (Diabetes Survival Skills Education) will improve Outcome: Progressing Goal: Individualized Educational Video(s) Outcome: Progressing   Problem: Coping: Goal: Ability to adjust to condition or change in health will improve Outcome: Progressing   Problem: Fluid Volume: Goal: Ability to maintain a balanced intake and output will improve Outcome: Progressing   Problem: Health Behavior/Discharge Planning: Goal: Ability to identify and utilize available resources and services will improve Outcome: Progressing Goal: Ability to manage health-related needs will improve Outcome: Progressing   Problem: Metabolic: Goal: Ability to maintain appropriate glucose levels will improve Outcome: Progressing   Problem: Nutritional: Goal: Maintenance of adequate nutrition will improve Outcome: Progressing Goal: Progress toward achieving an optimal weight will improve Outcome: Progressing   Problem: Skin Integrity: Goal: Risk for impaired skin integrity will decrease Outcome: Progressing   Problem: Tissue Perfusion: Goal: Adequacy of tissue perfusion will improve Outcome: Progressing   Problem: Education: Goal: Ability to  demonstrate management of disease process will improve Outcome: Progressing Goal: Ability to verbalize understanding of medication therapies will improve Outcome: Progressing Goal: Individualized Educational Video(s) Outcome: Progressing   Problem: Activity: Goal: Capacity to carry out activities will improve Outcome: Progressing   Problem: Cardiac: Goal: Ability to achieve and maintain adequate cardiopulmonary perfusion will improve Outcome: Progressing

## 2023-07-25 NOTE — Consult Note (Signed)
 PHARMACY - ANTICOAGULATION CONSULT NOTE  Pharmacy Consult for Heparin Indication: chest pain/ACS  No Known Allergies  Patient Measurements: Height: 6' (182.9 cm) Weight: (!) 158.8 kg (350 lb) IBW/kg (Calculated) : 77.6 Heparin Dosing Weight: 115.5 kg  Vital Signs: Temp: 99.9 F (37.7 C) (03/10 0000) BP: 98/53 (03/10 0000) Pulse Rate: 66 (03/10 0000)  Labs: Recent Labs    07/24/23 0417 07/24/23 0747 07/24/23 1016 07/24/23 1407 07/24/23 1951 07/24/23 2316  HGB 17.9*  --   --   --   --   --   HCT 57.2*  --   --   --   --   --   PLT 217  --   --   --   --   --   APTT  --  25  --   --   --   --   LABPROT  --  13.7  --   --   --   --   INR  --  1.0  --   --   --   --   HEPARINUNFRC  --   --   --   --   --  0.59  CREATININE 1.65*  --   --   --   --  1.55*  TROPONINIHS 50* 427*   < > 2,648* 2,540* 2,233*   < > = values in this interval not displayed.    Estimated Creatinine Clearance: 63.1 mL/min (A) (by C-G formula based on SCr of 1.55 mg/dL (H)).   Medical History: Past Medical History:  Diagnosis Date   CHF (congestive heart failure) (HCC)    Diabetes mellitus without complication (HCC)    Hypertension    MI (myocardial infarction) (HCC)     Medications:  No history of chronic anticoagulant use PTA  Assessment: 78 year old Caucasian male with a past medical history that is significant for CAD s/p coronary artery bypass graft x 3, type 2 diabetes, congestive heart failure, aortic stenosis s/p TAVR hypertension, and hyperlipidemia who presented to the ED via EMS with complaints of acute dyspnea and hypoxemia. Patient is currently intubated and sedated hence history is obtained from ED and EMS documentation. Cardiology has been consulted due to elevated troponin levels [phone]. Pharmacy has been consulted to initiate and monitor continuous heparin infusion.  Baseline labs: aPTT 25sec, INR 1.0, Plts 217, Hgb 17.9  Goal of Therapy:  Heparin level 0.3-0.7  units/ml Monitor platelets by anticoagulation protocol: Yes  3/09 2316 HL 0.59, therapeutic x 1   Plan:  Continue heparin infusion at 1650 units/hr Recheck anti-Xa level in 8 hours to confirm Continue to monitor H&H and platelets  Otelia Sergeant, PharmD, [facility] 07/25/2023 12:21 AM

## 2023-07-25 NOTE — Progress Notes (Signed)
 NAME:  Adam Ferguson, MRN:  [id number], DOB:  Jun 05, 1945, LOS: 1 ADMISSION DATE:  07/24/2023,  CHIEF COMPLAINT:  Acute toxic respiratory failure    HPI  Mr. Adam Ferguson is a 78 year old Caucasian male with a past medical history that is significant for CAD s/p coronary artery bypass graft x 3, type 2 diabetes, congestive heart failure, aortic stenosis s/p TAVR hypertension, and hyperlipidemia who presented to the ED via EMS with complaints of acute dyspnea and hypoxemia.  Patient is currently intubated and sedated hence history is obtained from ED and EMS documentation.  Per EMS documentation, patient's wife called EMS because patient was having difficulty breathing.  Upon EMS arrival, patient's SpO2 was 44% on room air as he was placed on CPAP and transferred to the ED.  Of note, patient was exposed to COVID-19.  Patient lives with his wife who has dementia. ED Course: Upon arrival in the ED, patient's SpO2 was 44% on CPAP.  He was emergently intubated.  Postintubation, patient was difficult to oxygenate requiring paralytics.  ED workup showed bilateral pneumonia, hypokalemia, hyperglycemia, elevated BNP suggestive of acute CHF exacerbation and bilateral pleural effusions.  Postintubation, patient developed septic shock requiring pressors. Initial vital signs showed HR of  73 beats/minute, BP 102/91 mmHg, the RR 37 breaths/minute, and the oxygen saturation 47% on CPAP and a temperature of 97.87F (36.6C). Patient was tripoding, speaking in one-word sentences and anxious.  He was emergently intubated upon arrival in the ED.  Patient was extremely difficult intubation.    Pertinent Labs/Diagnostics Findings: Na+ 136/ K+ 3.2:  Glucose: 313 BUN/Cr.:  1.65/20, calcium: 8.5, AST/ALT: 15/43, WBC: 9.4 without bands or neutrophil predominance   Hgb/Hct: Plts: 17.9/57.06/2015 Lactic acid: 9.0 COVID PCR: Positive troponin: 50 BNP: 1512 ABG postintubation: pO2 117; pCO2 51; pH 7.26;  HCO3 22., %O2 Sat 99.8 on 100%  FIO2  CXR> LL consolidation, BL pleural effusions and Pulmonary edema CTH> Negative for acute CVA CTA Chest> Negative for PE but bilateral pneumonia, BL pleural effusions CT neck-soft tissues: negative CT Abd/pelvis> cholelithiasis and prostatomegaly; bladder wall thickening  Past Medical History   Past Medical History:  Diagnosis Date   CHF (congestive heart failure) (HCC)    Diabetes mellitus without complication (HCC)    Hypertension    MI (myocardial infarction) (HCC)     Social History   Socioeconomic History   Marital status: Married    Spouse name: Not on file   Number of children: Not on file   Years of education: Not on file   Highest education level: Not on file  Occupational History   Not on file  Tobacco Use   Smoking status: Never   Smokeless tobacco: Never  Vaping Use   Vaping status: Never Used  Substance and Sexual Activity   Alcohol use: Not Currently   Drug use: Not Currently   Sexual activity: Not Currently    Birth control/protection: None  Other Topics Concern   Not on file  Social History Narrative   Not on file   Social Drivers of Health   Financial Resource Strain: Low Risk  (04/18/2023)   Received from [facility]   Overall Financial Resource Strain (CARDIA)    Difficulty of Paying Living Expenses: Not hard at all  Food Insecurity: Patient Unable To Answer (07/24/2023)   Hunger Vital Sign    Worried About Running Out of Food in the Last Year: Patient unable to answer    Ran Out of Food in the  Last Year: Patient unable to answer  Transportation Needs: Patient Unable To Answer (07/24/2023)   PRAPARE - Transportation    Lack of Transportation (Medical): Patient unable to answer    Lack of Transportation (Non-Medical): Patient unable to answer  Physical Activity: Unknown (12/29/2017)   Received from [facility], [facility]   Exercise Vital Sign    Days of Exercise per Week: 3 days    Minutes of Exercise per Session:  Not on file  Stress: No Stress Concern Present (12/29/2017)   Received from [facility], [facility] Occupational [facility] - Occupational Stress Questionnaire    Feeling of Stress : Only a little  Social Connections: Patient Unable To Answer (07/24/2023)   Social Connection and Isolation Panel [NHANES]    Frequency of Communication with Friends and Family: Patient unable to answer    Frequency of Social Gatherings with Friends and Family: Patient unable to answer    Attends Religious Services: Patient unable to answer    Active Member of Clubs or Organizations: Patient unable to answer    Attends Banker Meetings: Patient unable to answer    Marital Status: Patient unable to answer  Intimate Partner Violence: Patient Unable To Answer (07/24/2023)   Humiliation, Afraid, Rape, and Kick questionnaire    Fear of Current or Ex-Partner: Patient unable to answer    Emotionally Abused: Patient unable to answer    Physically Abused: Patient unable to answer    Sexually Abused: Patient unable to answer     Significant Hospital Events   07/24/2023: ED with acute hypoxic respiratory failure due to COVID-19 infection 07/25/2023: Pt remains mechanically intubated and able to follow commands during WUA.  Performed SBT pt initially had adequate tidal volumes, however he later became lethargic and apneic prompting backup rate.  Once mentation improves will perform SBT again   Micro Data:  03/09: COVID-19 03/09: Influenza PCR> negative 03/09: Blood culture x2>> 03/09: MRSA PCR>>positive   03/09: Strep pneumo urinary antigen>negative   Anti-infectives (From admission, onward)    Start     Dose/Rate Route Frequency Ordered Stop   07/25/23 1000  remdesivir 100 mg in sodium chloride 0.9 % 100 mL IVPB  Status:  Discontinued       Placed in "Followed by" Linked Group   100 mg 200 mL/hr over 30 Minutes Intravenous Daily 07/24/23 1812 07/24/23 1825   07/25/23 0000   vancomycin (VANCOREADY) IVPB 1500 mg/300 mL        1,500 mg 150 mL/hr over 120 Minutes Intravenous Every 24 hours 07/24/23 0549     07/24/23 2000  ceFEPIme (MAXIPIME) 2 g in sodium chloride 0.9 % 100 mL IVPB        2 g 200 mL/hr over 30 Minutes Intravenous Every 12 hours 07/24/23 0549 07/31/23 0759   07/24/23 1900  remdesivir 200 mg in sodium chloride 0.9% 250 mL IVPB  Status:  Discontinued       Placed in "Followed by" Linked Group   200 mg 580 mL/hr over 30 Minutes Intravenous Once 07/24/23 1812 07/24/23 1825   07/24/23 1000  azithromycin (ZITHROMAX) 500 mg in sodium chloride 0.9 % 250 mL IVPB        500 mg 250 mL/hr over 60 Minutes Intravenous Every 24 hours 07/24/23 0904     07/24/23 0600  metroNIDAZOLE (FLAGYL) IVPB 500 mg  Status:  Discontinued        500 mg 100 mL/hr over 60 Minutes Intravenous Every  12 hours 07/24/23 0537 07/24/23 0904   07/24/23 0545  ceFEPIme (MAXIPIME) 2 g in sodium chloride 0.9 % 100 mL IVPB        2 g 200 mL/hr over 30 Minutes Intravenous  Once 07/24/23 0537 07/24/23 0608   07/24/23 0545  vancomycin (VANCOREADY) IVPB 2000 mg/400 mL        2,000 mg 200 mL/hr over 120 Minutes Intravenous  Once 07/24/23 0537 07/24/23 0956      SUBJECTIVE:   As outlined above under significant events   OBJECTIVE  Blood pressure (!) 114/58, pulse 62, temperature 98.4 F (36.9 C), resp. rate 17, height 6' (1.829 m), weight 109.5 kg, SpO2 93%. CVP:  [6 mmHg-47 mmHg] 9 mmHg  Vent Mode: PRVC FiO2 (%):  [30 %] 30 % Set Rate:  [18 bmp] 18 bmp Vt Set:  [495 mL-500 mL] 495 mL PEEP:  [5 cmH20] 5 cmH20 Plateau Pressure:  [15 cmH20-19 cmH20] 19 cmH20   Intake/Output Summary (Last 24 hours) at 07/25/2023 1140 Last data filed at 07/25/2023 1132 Gross per 24 hour  Intake 2825.38 ml  Output 1915 ml  Net 910.38 ml   Filed Weights   07/24/23 0429 07/25/23 0500 07/25/23 0851  Weight: (!) 158.8 kg 95 kg 109.5 kg   Physical Examination  GENERAL: Acute on chronically-ill appearing  male, NAD mechanically intubated  HEENT: Head atraumatic, normocephalic. No JVD LUNGS: Faint rhonchi throughout, even, non labored  CARDIO: NSR, s1s2, no r/g, 2+ radial/1+ distal pulses, no edema  ABDOMEN: +BS x4, non distended, soft  EXTREMITIES: Normal bulk and tone, BLE cool to touch  NEUROLOGIC: Sedated but follows commands, PERRL  SKIN: No obvious rash, lesion, or ulcer. Warm to touch GU: Indwelling foley catheter draining yellow urine   Labs/imaging that I havepersonally reviewed  (right click and "Reselect all SmartList Selections" daily)    Labs   CBC: Recent Labs  Lab 07/24/23 0417 07/25/23 0624  WBC 9.4 15.7*  NEUTROABS 3.5  --   HGB 17.9* 15.4  HCT 57.2* 47.3  MCV 102.7* 97.9  PLT 217 193   Basic Metabolic Panel: Recent Labs  Lab 07/24/23 0417 07/24/23 0747 07/24/23 2316 07/25/23 0624  NA 136  --  137 138  K 3.2*  --  4.3 4.6  CL 99  --  106 106  CO2 20*  --  20* 20*  GLUCOSE 313*  --  206* 236*  BUN 20  --  24* 28*  CREATININE 1.65*  --  1.55* 1.59*  CALCIUM 8.5*  --  7.6* 7.8*  MG  --  2.1  --  2.3  PHOS  --   --   --  3.8   GFR: Estimated Creatinine Clearance: 50.5 mL/min (A) (by C-G formula based on SCr of 1.59 mg/dL (H)). Recent Labs  Lab 07/24/23 0417 07/24/23 0747 07/24/23 1016 07/24/23 1407 07/24/23 1951 07/24/23 2316 07/25/23 0624  WBC 9.4  --   --   --   --   --  15.7*  LATICACIDVEN  --    < > 2.4* 1.2 1.1 1.1  --    < > = values in this interval not displayed.    Liver Function Tests: Recent Labs  Lab 07/24/23 0417  AST 50*  ALT 43  ALKPHOS 61  BILITOT 1.2  PROT 7.7  ALBUMIN 4.0   No results for input(s): "LIPASE", "AMYLASE" in the last 168 hours. No results for input(s): "AMMONIA" in the last 168 hours.  ABG  Component Value Date/Time   PHART 7.28 (L) 07/25/2023 1030   PCO2ART 42 07/25/2023 1030   PO2ART 68 (L) 07/25/2023 1030   HCO3 19.7 (L) 07/25/2023 1030   ACIDBASEDEF 6.8 (H) 07/25/2023 1030   O2SAT 95.1  07/25/2023 1030     Coagulation Profile: Recent Labs  Lab 07/24/23 0747  INR 1.0    Cardiac Enzymes: No results for input(s): "CKTOTAL", "CKMB", "CKMBINDEX", "TROPONINI" in the last 168 hours.  HbA1C: Hgb A1c MFr Bld  Date/Time Value Ref Range Status  07/24/2023 07:35 AM 7.3 (H) 4.8 - 5.6 % Final    Comment:    (NOTE) Pre diabetes:          5.7%-6.4%  Diabetes:              >6.4%  Glycemic control for   <7.0% adults with diabetes   12/30/2021 03:06 PM 6.5 (H) 4.8 - 5.6 % Final    Comment:    (NOTE) Pre diabetes:          5.7%-6.4%  Diabetes:              >6.4%  Glycemic control for   <7.0% adults with diabetes     CBG: Recent Labs  Lab 07/24/23 1609 07/24/23 2001 07/24/23 2319 07/25/23 0317 07/25/23 0754  GLUCAP 168* 211* 223* 196* 219*    Review of Systems:   Unable to assess pt mechanically intubated   Past Medical History  He,  has a past medical history of CHF (congestive heart failure) (HCC), Diabetes mellitus without complication (HCC), Hypertension, and MI (myocardial infarction) (HCC).   Surgical History    Past Surgical History:  Procedure Laterality Date   AORTIC VALVE REPLACEMENT (AVR)/CORONARY ARTERY BYPASS GRAFTING (CABG)     CATARACT EXTRACTION W/PHACO Right 08/03/2021   Procedure: CATARACT EXTRACTION PHACO AND INTRAOCULAR LENS PLACEMENT (IOC) RIGHT DIABETIC 9.61 01:37.1;  Surgeon: Nevada Crane, MD;  Location: [facility];  Service: Ophthalmology;  Laterality: Right;  Diabetic   CATARACT EXTRACTION W/PHACO Left 08/17/2021   Procedure: CATARACT EXTRACTION PHACO AND INTRAOCULAR LENS PLACEMENT (IOC) LEFT DIABETIC 3.33 00:33.1;  Surgeon: Nevada Crane, MD;  Location: [facility];  Service: Ophthalmology;  Laterality: Left;  Diabetic   STENT PLACEMENT VASCULAR ([facility] HX)     VALVE REPLACEMENT      Social History   reports that he has never smoked. He has never used smokeless tobacco. He reports that he does not  currently use alcohol. He reports that he does not currently use drugs.   Family History   His family history is negative for Other.   Allergies No Known Allergies   Home Medications  Prior to Admission medications   Medication Sig Start Date End Date Taking? Authorizing Provider  aspirin 81 MG chewable tablet Chew 1 tablet (81 mg total) by mouth daily. 01/02/22  Yes Wouk, Wilfred Curtis, MD  ascorbic acid (VITAMIN C) 500 MG tablet Take 500 mg by mouth daily.    [provider]  atorvastatin (LIPITOR) 80 MG tablet Take 80 mg by mouth at bedtime.    [provider]  B Complex-C-Folic Acid (RENAL) 1 MG CAPS Take 1 capsule by mouth every evening.    [provider]  carvedilol (COREG) 3.125 MG tablet Take 1 tablet (3.125 mg total) by mouth in the morning and at bedtime. 05/16/20 12/30/21  Narda Bonds, MD  clopidogrel (PLAVIX) 75 MG tablet Take 1 tablet (75 mg total) by mouth every evening. 09/22/20  Clarisa Kindred A, FNP  furosemide (LASIX) 20 MG tablet Take 3 tablets (60 mg total) by mouth daily. Patient taking differently: Take 20 mg by mouth daily. Take along with one 40 mg tablet for total 60 mg dose once daily 05/16/20 12/30/21  Narda Bonds, MD  furosemide (LASIX) 40 MG tablet Take 40 mg by mouth daily. Take along with one 20 mg tablet for total 60 mg dose once daily 12/28/21   [provider]  insulin glargine (LANTUS) 100 UNIT/ML Solostar Pen Inject 40 Units into the skin at bedtime.    [provider]  isosorbide mononitrate (IMDUR) 30 MG 24 hr tablet Take 1 tablet (30 mg total) by mouth daily. 01/02/22   Wouk, Wilfred Curtis, MD  JARDIANCE 25 MG TABS tablet Take 25 mg by mouth daily. 12/28/21   [provider]  metFORMIN (GLUCOPHAGE) 500 MG tablet Take 500 mg by mouth 2 (two) times daily. 12/28/21   [provider]  Multiple Vitamins-Minerals (MULTIVITAMIN ADULTS 50+) TABS Take 1 tablet by mouth daily.    [provider]   Omega-3 1000 MG CAPS Take 1,000 mg by mouth every 3 (three) days.    [provider]  Probiotic Product (PROBIOTIC-10) CHEW Chew 1 capsule by mouth every other day.    [provider]  sacubitril-valsartan (ENTRESTO) 49-51 MG Take 1 tablet by mouth in the morning and at bedtime.    [provider]  Semaglutide, 1 MG/DOSE, (OZEMPIC, 1 MG/DOSE,) 4 MG/3ML SOPN Inject 1 mg into the skin once a week. Sunday 11/09/21   [provider]     Assessment & Plan:   #Acute toxic metabolic encephalopathy~improving   #Mechanical intubation discomfort/pain  - Correct metabolic derangements - Maintain RASS goal 0 to -1 - PAD protocol to maintain RASS goal: propofol and fentanyl gtts  - WUA daily   #Septic shock  #Acute on chronic CHF #Elevated troponin likely secondary to demand ischemia vs. NSTEMI   Hx: Aortic valve replacement TAVR, coronary bypass surgery x3, and ischemic cardiomyopathy, and HTN   Echo 07/24/23: EF 20 to 25%; left ventricle has severely decreased function; mild mitral valve regurgitation  - Continuous telemetry monitoring  - Troponin's peaked at 2,648 - Prn levophed gtt to maintain map 65 or higher  - Hold outpatient antihypertensives  - Diurese as bp and renal function permits  - Continue heparin gtt: dosing per pharmacy  - Hold GDMT due to vasopressor requirements  - Cardiology consulted appreciate input   #Acute hypoxic respiratory failure  #COVID-19 infection #CAP #Bilateral pleural effusions - Full vent support for now: vent settings reviewed and established  - Continue lung protective strategies  - Maintain plateau pressures less than 30 cm H2O  - SBT once all parameters met  - VAP bundle implemented  - IV steroids - Prn bronchodilator therapy  - Intermittent CXR and ABG's    #Acute kidney injury secondary to ATN  #Non anion gap metabolic acidosis  #Lactic acidosis~resolved  - Trend BMP  - Replace electrolytes as indicated -  Strict I&O's - Avoid nephrotoxic agents as able   #COVID-19 infection  #CAP  - Trend WBC and monitor fever curve  - Follow cultures  - Continue abx therapy as outlined above pending culture results and sensitivities   #Type II diabetes mellitus  - CBG's q4hrs  - SSI  - Follow hyper/hypoglycemic protocol  - Target CBG 140 to 180    Best practice:  Diet:  NPO, will start TF's  if unable to extubate Pain/Anxiety/Delirium protocol (if indicated): Yes (RASS goal 0 to -1) VAP protocol (if indicated): Yes DVT prophylaxis: Heparin gtt  GI prophylaxis: PPI Glucose control:  SSI Yes Central venous access:  Yes, and it is still needed Arterial line:  N/A Foley:  Yes, and it is still needed Mobility:  bed rest  PT consulted: N/A Last date of multidisciplinary goals of care discussion (07/25/2023) Code Status:  full code Disposition: ICU  Critical care time: 45 minutes      Zada Girt, AGNP  Pulmonary/Critical Care Pager [phone] (please enter 7 digits) PCCM Consult Pager [phone] (please enter 7 digits)

## 2023-07-25 NOTE — Plan of Care (Signed)
  Problem: Respiratory: Goal: Will maintain a patent airway Outcome: Progressing Goal: Complications related to the disease process, condition or treatment will be avoided or minimized Outcome: Progressing   Problem: Fluid Volume: Goal: Hemodynamic stability will improve Outcome: Progressing   Problem: Clinical Measurements: Goal: Diagnostic test results will improve Outcome: Progressing   Problem: Respiratory: Goal: Ability to maintain adequate ventilation will improve Outcome: Progressing   Problem: Education: Goal: Knowledge of General Education information will improve Description: Including pain rating scale, medication(s)/side effects and non-pharmacologic comfort measures Outcome: Progressing   Problem: Health Behavior/Discharge Planning: Goal: Ability to manage health-related needs will improve Outcome: Progressing   Problem: Clinical Measurements: Goal: Ability to maintain clinical measurements within normal limits will improve Outcome: Progressing Goal: Diagnostic test results will improve Outcome: Progressing Goal: Respiratory complications will improve Outcome: Progressing Goal: Cardiovascular complication will be avoided Outcome: Progressing   Problem: Activity: Goal: Risk for activity intolerance will decrease Outcome: Progressing   Problem: Nutrition: Goal: Adequate nutrition will be maintained Outcome: Progressing   Problem: Coping: Goal: Level of anxiety will decrease Outcome: Progressing   Problem: Elimination: Goal: Will not experience complications related to urinary retention Outcome: Progressing   Problem: Pain Managment: Goal: General experience of comfort will improve and/or be controlled Outcome: Progressing   Problem: Safety: Goal: Ability to remain free from injury will improve Outcome: Progressing   Problem: Skin Integrity: Goal: Risk for impaired skin integrity will decrease Outcome: Progressing   Problem: Fluid  Volume: Goal: Ability to maintain a balanced intake and output will improve Outcome: Progressing   Problem: Metabolic: Goal: Ability to maintain appropriate glucose levels will improve Outcome: Progressing   Problem: Skin Integrity: Goal: Risk for impaired skin integrity will decrease Outcome: Progressing

## 2023-07-25 NOTE — Progress Notes (Signed)
 Initial Nutrition Assessment  DOCUMENTATION CODES:   Obesity unspecified  INTERVENTION:   -TF via OGT:   Initiate Vital High Protein @ 20 ml/hr and increase by 10 ml every 4 hours to goal rate of 50 ml/hr.   60 ml Prosource TF TID  30 ml free water flush every 4 hours  Tube feeding regimen provides 1440 kcal (100% of needs), 165 grams of protein, and 1003 ml of H2O.  Total free water: 1183 ml daily  NUTRITION DIAGNOSIS:   Inadequate oral intake related to inability to eat as evidenced by NPO status.  GOAL:   Provide needs based on ASPEN/SCCM guidelines  MONITOR:   Vent status, TF tolerance  REASON FOR ASSESSMENT:   Consult, Ventilator Enteral/tube feeding initiation and management  ASSESSMENT:   Pt with a past medical history that is significant for CAD s/p coronary artery bypass graft x 3, type 2 diabetes, congestive heart failure, aortic stenosis s/p TAVR hypertension, and hyperlipidemia who presented with complaints of acute dyspnea and hypoxemia.  Pt admitted with acute toxic metabolic encephalopathy, septic chock, acute on chronic CHF, demand ischemia vs NSTEMI, COVID-19 infection, CAP, and bilateral pleural effusions.    3/9- intubated  Patient is currently intubated on ventilator support. OGT currently connected for low, intermittent suction. Per discussion with RN, plan to obtain KUB to verify placement; will start feeds if not extubated today. Per KUB on 07/25/23, OGT tip and side port in stomach.  MV: 8.8 L/min Temp (24hrs), Avg:99.7 F (37.6 C), Min:98.1 F (36.7 C), Max:102.6 F (39.2 C)  Reviewed I/O's: -789 ml x 24 hours  UOP: 2.7 L x 24 hours  OGT output: 365 ml x 24 hours  No wt loss noted over the past 2 years.   Medications reviewed and include decadron, protonix, and levophed.  Lab Results  Component Value Date   HGBA1C 7.3 (H) 07/24/2023   PTA DM medications are 1 mg semglutide weekly, 25 mg jardiance daily, and 40 units insulin  glargine daily.   Labs reviewed: CBGS: 115-219 (inpatient orders for glycemic control are 0-9 units insulin aspart every 4 hours and 10 units insulin glargine BID).    NUTRITION - FOCUSED PHYSICAL EXAM:  Flowsheet Row Most Recent Value  Orbital Region No depletion  Upper Arm Region No depletion  Thoracic and Lumbar Region No depletion  Buccal Region No depletion  Temple Region No depletion  Clavicle Bone Region No depletion  Clavicle and Acromion Bone Region No depletion  Scapular Bone Region No depletion  Dorsal Hand No depletion  Patellar Region No depletion  Anterior Thigh Region No depletion  Posterior Calf Region No depletion  Edema (RD Assessment) Moderate  Hair Reviewed  Eyes Reviewed  Mouth Reviewed  Skin Reviewed  Nails Reviewed       Diet Order:   Diet Order             Diet NPO time specified  Diet effective now                   EDUCATION NEEDS:   Not appropriate for education at this time  Skin:  Skin Assessment: Reviewed RN Assessment  Last BM:  07/24/23  Height:   Ht Readings from Last 1 Encounters:  07/24/23 6' (1.829 m)    Weight:   Wt Readings from Last 1 Encounters:  07/25/23 109.5 kg    Ideal Body Weight:  80.9 kg  BMI:  Body mass index is 32.74 kg/m.  Estimated Nutritional Needs:  Kcal:  1610-9604  Protein:  > 162 grams  Fluid:  1.2-1.5 L    Levada Schilling, RD, LDN, CDCES Registered Dietitian III Certified Diabetes Care and Education Specialist If unable to reach this RD, please use "RD Inpatient" group chat on secure chat between hours of 8am-4 pm daily

## 2023-07-25 NOTE — Progress Notes (Signed)
 Heart Failure Navigator Progress Note  Assessed for Heart & Vascular TOC clinic readiness.  Patient does not meet criteria due to [facility] patient.  Navigator will sign off at this time.   Roxy Horseman, RN, BSN [facility] Heart Failure Navigator Secure Chat Only

## 2023-07-25 NOTE — Progress Notes (Signed)
 Patient alert but drowsy. Patient placed back into ventilator setting. Will continue to monitor

## 2023-07-25 NOTE — Progress Notes (Signed)
 Pharmacy Antibiotic Note  Adam Ferguson is a 78 y.o. male admitted on 07/24/2023 with sepsis from unknown source with a a past medical history that is significant for CAD s/p coronary artery bypass graft x 3, type 2 diabetes, congestive heart failure, aortic stenosis s/p TAVR hypertension, and hyperlipidemia. Patient was intubated due to acute respiratory failure in the setting of COVID, pulmonary edema and possible superimposed bacterial infection. Pharmacy has been consulted for Cefepime and Vancomycin dosing for 7 days.  Today, 07/25/2023 Scr 1.59 (above baseline ~1.2 from 11/24) WBC 15.7 >> 9.4 Fever 102.6 (no fevers since) Imaging 3/9 CT Chest: Patchy perihilar predominant opacities and diffuse interstitial prominence, similar in comparison prior and likely reflecting a combination of infection and edema. 3/9 CT Head: No acute intracranial abnormality or acute traumatic injury identified. 3/9 CT neck: No other acute or inflammatory process identified in the noncontrast Neck. 3/10 Abdomina: Pending  Patient already received 2g loading dose   Plan: Day 2 of antibiotics No adjustment needed for vancomycin with updated weight (109.5 kg), updated kinetics Vancomycin 1500 mg Q24H  Expected AUC: 470.9 Expected Css min: 12.3 SCr used: 1.59  Weight used: IBW, Vd used: 0.72 (BMI 32.74) Patient is also on Cefepime 2 g IV Q12H Continue to monitor renal function and adjust as indicated  Follow culture results Follow for identifying the source to de-escalate antibiotics   Temp (24hrs), Avg:100.1 F (37.8 C), Min:98.1 F (36.7 C), Max:102.6 F (39.2 C)   Recent Labs  Lab 07/24/23 0417 07/24/23 0747 07/24/23 1016 07/24/23 1407 07/24/23 1951 07/24/23 2316 07/25/23 0624  WBC 9.4  --   --   --   --   --  15.7*  CREATININE 1.65*  --   --   --   --  1.55* 1.59*  LATICACIDVEN  --  2.1* 2.4* 1.2 1.1 1.1  --     Estimated Creatinine Clearance: 50.5 mL/min (A) (by C-G formula based on SCr of  1.59 mg/dL (H)).    No Known Allergies  Antimicrobials this admission: 3/09 Flagyl x 1 3/09 Cefepime >> x 7 days 3/09 Vancomycin >> x 7 days 3/9 Zithromax 500 Q24H >>   Microbiology results: 3/09 BCx: NGTD 3/9 Respiratory: GPC 3/9 MRSA nares: positive   Thank you for allowing pharmacy to be a part of this patient's care.  Effie Shy, PharmD Pharmacy Resident  07/25/2023 12:40 PM

## 2023-07-25 NOTE — Consult Note (Signed)
 PHARMACY - ANTICOAGULATION CONSULT NOTE  Pharmacy Consult for Heparin Indication: chest pain/ACS  No Known Allergies  Patient Measurements: Height: 6' (182.9 cm) Weight: 109.5 kg (241 lb 6.5 oz) IBW/kg (Calculated) : 77.6 Heparin Dosing Weight: 115.5 kg  Vital Signs: Temp: 99.3 F (37.4 C) (03/10 1600) BP: 126/65 (03/10 1600) Pulse Rate: 76 (03/10 1600)  Labs: Recent Labs    07/24/23 0417 07/24/23 0747 07/24/23 1016 07/24/23 1407 07/24/23 1951 07/24/23 2316 07/25/23 0624 07/25/23 1537  HGB 17.9*  --   --   --   --   --  15.4  --   HCT 57.2*  --   --   --   --   --  47.3  --   PLT 217  --   --   --   --   --  193  --   APTT  --  25  --   --   --   --   --   --   LABPROT  --  13.7  --   --   --   --   --   --   INR  --  1.0  --   --   --   --   --   --   HEPARINUNFRC  --   --   --   --   --  0.59 0.85* 0.68  CREATININE 1.65*  --   --   --   --  1.55* 1.59*  --   TROPONINIHS 50* 427*   < > 2,648* 2,540* 2,233*  --   --    < > = values in this interval not displayed.    Estimated Creatinine Clearance: 50.5 mL/min (A) (by C-G formula based on SCr of 1.59 mg/dL (H)).   Medical History: Past Medical History:  Diagnosis Date   CHF (congestive heart failure) (HCC)    Diabetes mellitus without complication (HCC)    Hypertension    MI (myocardial infarction) (HCC)     Medications:  No history of chronic anticoagulant use PTA  Assessment: 78 year old Caucasian male with a past medical history that is significant for CAD s/p coronary artery bypass graft x 3, type 2 diabetes, congestive heart failure, aortic stenosis s/p TAVR hypertension, and hyperlipidemia who presented to the ED via EMS with complaints of acute dyspnea and hypoxemia. Patient is currently intubated and sedated hence history is obtained from ED and EMS documentation. Cardiology has been consulted due to elevated troponin levels [phone]. Pharmacy has been consulted to initiate and monitor continuous  heparin infusion.  Baseline labs:  aPTT 25sec, INR 1.0, Plts 217, Hgb 17.9  Goal of Therapy:  Heparin level 0.3-0.7 units/ml Monitor platelets by anticoagulation protocol: Yes  Heparin Levels  Date/Time  HL  Clinical Assessment 3/09@2316   0.59  Therapeutic x 1 3/10@0624   0.85  SUPRAtherapeutic  3/10@1537   0.68  Therapeutic x 1   Heparin started 3/9 ~ 1430 for ACS  Plan:  --- Continue heparin drip at 1450 units/hr  --- Check confirmatory anti-Xa level in 8 hours --- Follow Cardiology for duration of treatment --- Continue to monitor H&H and platelets  Merryl Hacker, PharmD Clinical Pharmacist  07/25/2023 4:16 PM

## 2023-07-25 NOTE — Consult Note (Signed)
 PHARMACY CONSULT NOTE - ELECTROLYTES  Pharmacy Consult for Electrolyte Monitoring and Replacement   Recent Labs: Height: 6' (182.9 cm) Weight: 95 kg (209 lb 7 oz) IBW/kg (Calculated) : 77.6 Estimated Creatinine Clearance: 47.3 mL/min (A) (by C-G formula based on SCr of 1.59 mg/dL (H)). Potassium (mmol/L)  Date Value  07/25/2023 4.6   Magnesium (mg/dL)  Date Value  40/98/1191 2.3   Calcium (mg/dL)  Date Value  47/82/9562 7.8 (L)   Albumin (g/dL)  Date Value  13/12/6576 4.0   Phosphorus (mg/dL)  Date Value  46/96/2952 3.8   Sodium (mmol/L)  Date Value  07/25/2023 138    Assessment  Adam Ferguson is a 78 y.o. male presenting with acute respiratory failure. PMH significant for hypertension, diabetes, CHF, CAD status post bypass, TAVR . Pharmacy has been consulted to monitor and replace electrolytes.  Diet: NPO, intubated Pertinent medications:   Goal of Therapy: Electrolytes WNL (Target > 4 K and > 2 Mg due to history of CHF/CAD)  Plan:  No replacement required at this time Check BMP, Mg, Phos with AM labs  Thank you for allowing pharmacy to be a part of this patient's care.  Effie Shy, PharmD Pharmacy Resident  07/25/2023 7:37 AM

## 2023-07-25 NOTE — Inpatient Diabetes Management (Signed)
 Inpatient Diabetes Program Recommendations  AACE/ADA: New Consensus Statement on Inpatient Glycemic Control (2015)  Target Ranges:  Prepandial:   less than 140 mg/dL      Peak postprandial:   less than 180 mg/dL (1-2 hours)      Critically ill patients:  140 - 180 mg/dL    Latest Reference Range & Units 07/24/23 07:35  Hemoglobin A1C 4.8 - 5.6 % 7.3 (H)  (H): Data is abnormally high  Latest Reference Range & Units 07/24/23 04:42 07/24/23 06:56 07/24/23 11:56 07/24/23 16:09 07/24/23 20:01  Glucose-Capillary 70 - 99 mg/dL 063 (H) 016 (H) 010 (H) 168 (H)  2 units Novolog  211 (H)  3 units Novolog   (H): Data is abnormally high  Latest Reference Range & Units 07/24/23 23:19 07/25/23 03:17 07/25/23 07:54  Glucose-Capillary 70 - 99 mg/dL 932 (H)  3 units Novolog  196 (H)  2 units Novolog  219 (H)  3 units Novolog   (H): Data is abnormally high    Admit with:  Acute hypoxic respiratory failure requiring intubation  Acute Pulmonary Edema Acute CHF exacerbation COVID-19 infection Bilateral Community Pneumonia  Bilateral pleural effusions  History: DM  Home DM Meds: Lantus 40 units at bedtime        Jardiance 25 mg daily        Metformin 500 mg BID        Ozempic 1 mg Qweek  Current Orders: Novolog Sensitive Correction Scale/ SSI (0-9 units) Q4 hours    MD- Note pt getting Decadron 6 mg daily.  Takes Lantus at home.  CBGs >200.  Please consider:  Start Lantus 10 units BID (50% home dose)    --Will follow patient during hospitalization--  Ambrose Finland RN, MSN, CDCES Diabetes Coordinator Inpatient Glycemic Control Team Team Pager: [phone] (8a-5p)

## 2023-07-25 NOTE — Consult Note (Addendum)
 PHARMACY - ANTICOAGULATION CONSULT NOTE  Pharmacy Consult for Heparin Indication: chest pain/ACS  No Known Allergies  Patient Measurements: Height: 6' (182.9 cm) Weight: 95 kg (209 lb 7 oz) IBW/kg (Calculated) : 77.6 Heparin Dosing Weight: 115.5 kg  Vital Signs: Temp: 98.2 F (36.8 C) (03/10 0700) BP: 119/58 (03/10 0700) Pulse Rate: 59 (03/10 0700)  Labs: Recent Labs    07/24/23 0417 07/24/23 0747 07/24/23 1016 07/24/23 1407 07/24/23 1951 07/24/23 2316 07/25/23 0624  HGB 17.9*  --   --   --   --   --  15.4  HCT 57.2*  --   --   --   --   --  47.3  PLT 217  --   --   --   --   --  193  APTT  --  25  --   --   --   --   --   LABPROT  --  13.7  --   --   --   --   --   INR  --  1.0  --   --   --   --   --   HEPARINUNFRC  --   --   --   --   --  0.59 0.85*  CREATININE 1.65*  --   --   --   --  1.55* 1.59*  TROPONINIHS 50* 427*   < > 2,648* 2,540* 2,233*  --    < > = values in this interval not displayed.    Estimated Creatinine Clearance: 47.3 mL/min (A) (by C-G formula based on SCr of 1.59 mg/dL (H)).   Medical History: Past Medical History:  Diagnosis Date   CHF (congestive heart failure) (HCC)    Diabetes mellitus without complication (HCC)    Hypertension    MI (myocardial infarction) (HCC)     Medications:  No history of chronic anticoagulant use PTA  Assessment: 78 year old Caucasian male with a past medical history that is significant for CAD s/p coronary artery bypass graft x 3, type 2 diabetes, congestive heart failure, aortic stenosis s/p TAVR hypertension, and hyperlipidemia who presented to the ED via EMS with complaints of acute dyspnea and hypoxemia. Patient is currently intubated and sedated hence history is obtained from ED and EMS documentation. Cardiology has been consulted due to elevated troponin levels [phone]. Pharmacy has been consulted to initiate and monitor continuous heparin infusion.  Baseline labs:  aPTT 25sec, INR 1.0, Plts  217, Hgb 17.9  Goal of Therapy:  Heparin level 0.3-0.7 units/ml Monitor platelets by anticoagulation protocol: Yes  Heparin Levels  Date/Time  HL  Clinical Assessment 3/09@2316   0.59  Therapeutic x 1 3/10@0624   0.85  SUPRAtherapeutic    Heparin started 3/9 ~ 1430 for ACS  Plan:  --- Reduce heparin drip to 1450 units/hr from 1650 units/hr --- Recheck anti-Xa level in 8 hours after rate change --- Follow Cardiology for duration of treatment --- Continue to monitor H&H and platelets  Effie Shy, PharmD Pharmacy Resident  07/25/2023 7:41 AM

## 2023-07-25 NOTE — Progress Notes (Signed)
 Patient follow a few command, opens eyes. Attempted switching to pressure support. Patient unable to consistently pull enough volume. Placed back on ventilator setting. Will attempts again once patient more awake

## 2023-07-25 NOTE — Progress Notes (Signed)
 [facility] CLINIC CARDIOLOGY PROGRESS NOTE   Patient ID: Adam Ferguson MRN: [id number] DOB/AGE: 1945-07-09 78 y.o.  Admit date: 07/24/2023 Referring Physician Dr Larinda Buttery Primary Physician Mick Sell, MD Primary Cardiologist Paraschos Reason for Consultation respiratory failure cardiomyopathy elevated troponins   HPI: 78 year old male coronary disease coronary bypass surgery March 2019 TAVR for aortic stenosis 2019 hypertension diabetes systolic congestive heart failure cardiomyopathy ischemic hyperlipidemia obesity presents via EMS with acute dyspnea and hypoxemia patient was intubated and sedated in the emergency room with severe hypoxemia and found to have COVID and what appeared to be bilateral pneumonia presumably related to COVID patient had slightly elevated troponins EKG was nondiagnostic BNP was elevated at 1500.   Interval History:  -Patient seen and examined at bedside. No events overnight.  Review of systems complete and found to be negative unless listed above    Vitals:   07/25/23 1045 07/25/23 1100 07/25/23 1115 07/25/23 1130  BP: 115/62 (!) 111/58 110/60 (!) 114/58  Pulse: (!) 58 62 62 62  Resp: (!) 6 18 16 17   Temp: 98.6 F (37 C) 98.4 F (36.9 C) 98.4 F (36.9 C) 98.4 F (36.9 C)  TempSrc:      SpO2: 96% 93% 92% 93%  Weight:      Height:         Intake/Output Summary (Last 24 hours) at 07/25/2023 1232 Last data filed at 07/25/2023 1223 Gross per 24 hour  Intake 2825.38 ml  Output 2140 ml  Net 685.38 ml     PHYSICAL EXAM General:  well nourished, in no acute distress. HEENT: Normocephalic and atraumatic. Neck: No JVD.  Lungs: Normal respiratory effort. Clear bilaterally to auscultation. No wheezes, crackles, rhonchi.  Heart: HRRR. Normal S1 and S2 without gallops or murmurs. Radial & DP pulses 2+ bilaterally. Abdomen: Non-distended appearing.  Msk: Normal strength and tone for age. Extremities: No clubbing, cyanosis or edema.   Neuro: Alert and  oriented X 3. Psych: Mood appropriate, affect congruent.    LABS: Basic Metabolic Panel: Recent Labs    07/24/23 0747 07/24/23 2316 07/25/23 0624  NA  --  137 138  K  --  4.3 4.6  CL  --  106 106  CO2  --  20* 20*  GLUCOSE  --  206* 236*  BUN  --  24* 28*  CREATININE  --  1.55* 1.59*  CALCIUM  --  7.6* 7.8*  MG 2.1  --  2.3  PHOS  --   --  3.8   Liver Function Tests: Recent Labs    07/24/23 0417  AST 50*  ALT 43  ALKPHOS 61  BILITOT 1.2  PROT 7.7  ALBUMIN 4.0   No results for input(s): "LIPASE", "AMYLASE" in the last 72 hours. CBC: Recent Labs    07/24/23 0417 07/25/23 0624  WBC 9.4 15.7*  NEUTROABS 3.5  --   HGB 17.9* 15.4  HCT 57.2* 47.3  MCV 102.7* 97.9  PLT 217 193   Cardiac Enzymes: Recent Labs    07/24/23 1407 07/24/23 1951 07/24/23 2316  TROPONINIHS 2,648* 2,540* 2,233*   BNP: Recent Labs    07/24/23 0417  BNP 1,512.0*   D-Dimer: No results for input(s): "DDIMER" in the last 72 hours. Hemoglobin A1C: Recent Labs    07/24/23 0735  HGBA1C 7.3*   Fasting Lipid Panel: No results for input(s): "CHOL", "HDL", "LDLCALC", "TRIG", "CHOLHDL", "LDLDIRECT" in the last 72 hours. Thyroid Function Tests: No results for input(s): "TSH", "T4TOTAL", "T3FREE", "THYROIDAB" in the last  72 hours.  Invalid input(s): "FREET3" Anemia Panel: No results for input(s): "VITAMINB12", "FOLATE", "FERRITIN", "TIBC", "IRON", "RETICCTPCT" in the last 72 hours.  DG Chest 1 View Result Date: 07/25/2023 CLINICAL DATA:  ARDS. EXAM: CHEST  1 VIEW COMPARISON:  07/24/2023 FINDINGS: There is a right IJ catheter with tip projecting over the distal SVC. Enteric tube tip is in the stomach. ET tube tip is above the carina. Stable cardiomediastinal contours. Lung volumes are low. Pulmonary vascular congestion. No airspace consolidation. IMPRESSION: 1. Low lung volumes and pulmonary vascular congestion. 2. Support apparatus as above. Electronically Signed   By: Signa Kell M.D.    On: 07/25/2023 09:10   ECHOCARDIOGRAM COMPLETE Result Date: 07/24/2023    ECHOCARDIOGRAM REPORT   Patient Name:   Adam Ferguson Date of Exam: 07/24/2023 Medical Rec #:  [id number]     Height:       72.0 in Accession #:    [id number]    Weight:       350.0 lb Date of Birth:  [birth year]     BSA:          2.703 m Patient Age:    77 years      BP:           111/68 mmHg Patient Gender: M             HR:           72 bpm. Exam Location:  [facility] Procedure: 2D Echo and Definity (Both Spectral and Color Flow Doppler were            utilized during procedure). Indications:     Dyspnea  History:         Patient has prior history of Echocardiogram examinations.  Sonographer:     Elwin Sleight RDCS Referring Phys:  [id number] KHABIB DGAYLI Diagnosing Phys: Julien Nordmann MD  Sonographer Comments: Technically difficult study due to poor echo windows, echo performed with patient supine and on artificial respirator and patient is obese. IMPRESSIONS  1. Left ventricular ejection fraction, by estimation, is 20 to 25%. Left ventricular ejection fraction by 2D MOD biplane is 20.4 %. The left ventricle has severely decreased function. The left ventricle has no regional wall motion abnormalities. The left ventricular internal cavity size was moderately dilated. Left ventricular diastolic parameters are indeterminate.  2. Right ventricular systolic function is mildly reduced. The right ventricular size is normal.  3. The mitral valve is normal in structure. Mild mitral valve regurgitation. No evidence of mitral stenosis.  4. The aortic valve is normal in structure. Aortic valve regurgitation is not visualized. No aortic stenosis is present. Aortic valve mean gradient measures 7.0 mmHg.  5. The inferior vena cava is normal in size with greater than 50% respiratory variability, suggesting right atrial pressure of 3 mmHg. FINDINGS  Left Ventricle: Left ventricular ejection fraction, by estimation, is 20 to 25%. Left ventricular ejection fraction  by 2D MOD biplane is 20.4 %. The left ventricle has severely decreased function. The left ventricle has no regional wall motion abnormalities. Definity contrast agent was given IV to delineate the left ventricular endocardial borders. Strain was performed and the global longitudinal strain is indeterminate. The left ventricular internal cavity size was moderately dilated. There is no left ventricular hypertrophy. Left ventricular diastolic parameters are indeterminate. Right Ventricle: The right ventricular size is normal. No increase in right ventricular wall thickness. Right ventricular systolic function is mildly reduced. Left Atrium: Left atrial size was normal in size. Right  Atrium: Right atrial size was normal in size. Pericardium: There is no evidence of pericardial effusion. Mitral Valve: The mitral valve is normal in structure. There is mild calcification of the mitral valve leaflet(s). Mild mitral valve regurgitation. No evidence of mitral valve stenosis. Tricuspid Valve: The tricuspid valve is normal in structure. Tricuspid valve regurgitation is not demonstrated. No evidence of tricuspid stenosis. Aortic Valve: The aortic valve is normal in structure. Aortic valve regurgitation is not visualized. No aortic stenosis is present. Aortic valve mean gradient measures 7.0 mmHg. Aortic valve peak gradient measures 12.6 mmHg. Aortic valve area, by VTI measures 1.21 cm. Pulmonic Valve: The pulmonic valve was normal in structure. Pulmonic valve regurgitation is not visualized. No evidence of pulmonic stenosis. Aorta: The aortic root is normal in size and structure. Venous: The inferior vena cava is normal in size with greater than 50% respiratory variability, suggesting right atrial pressure of 3 mmHg. IAS/Shunts: No atrial level shunt detected by color flow Doppler. Additional Comments: 3D was performed not requiring image post processing on an independent workstation and was indeterminate.  LEFT VENTRICLE PLAX  2D                        Biplane EF (MOD) LVIDd:         5.60 cm         LV Biplane EF:   Left LVIDs:         5.10 cm                          ventricular LV PW:         1.00 cm                          ejection LV IVS:        1.20 cm                          fraction by LVOT diam:     2.20 cm                          2D MOD LV SV:         33                               biplane is LV SV Index:   12                               20.4 %. LVOT Area:     3.80 cm                                Diastology                                LV e' medial:  5.66 cm/s LV Volumes (MOD)               LV e' lateral: 5.11 cm/s LV vol d, MOD    189.5 ml A2C: LV vol d, MOD    177.0 ml A4C: LV vol s, MOD    145.0 ml A2C:  LV vol s, MOD    138.0 ml A4C: LV SV MOD A2C:   44.5 ml LV SV MOD A4C:   177.0 ml LV SV MOD BP:    37.7 ml RIGHT VENTRICLE RV Basal diam:  3.90 cm RV S prime:     8.16 cm/s TAPSE (M-mode): 1.3 cm LEFT ATRIUM              Index        RIGHT ATRIUM           Index LA diam:        4.70 cm  1.74 cm/m   RA Area:     19.70 cm LA Vol (A2C):   104.0 ml 38.48 ml/m  RA Volume:   54.60 ml  20.20 ml/m LA Vol (A4C):   65.4 ml  24.20 ml/m LA Biplane Vol: 85.5 ml  31.64 ml/m  AORTIC VALVE                     PULMONIC VALVE AV Area (Vmax):    1.16 cm      PV Vmax:        0.81 m/s AV Area (Vmean):   1.08 cm      PV Peak grad:   2.6 mmHg AV Area (VTI):     1.21 cm      RVOT Peak grad: 1 mmHg AV Vmax:           177.33 cm/s AV Vmean:          120.500 cm/s AV VTI:            0.274 m AV Peak Grad:      12.6 mmHg AV Mean Grad:      7.0 mmHg LVOT Vmax:         54.00 cm/s LVOT Vmean:        34.100 cm/s LVOT VTI:          0.087 m LVOT/AV VTI ratio: 0.32  AORTA Ao Root diam: 3.50 cm  SHUNTS Systemic VTI:  0.09 m Systemic Diam: 2.20 cm Julien Nordmann MD Electronically signed by Julien Nordmann MD Signature Date/Time: 07/24/2023/3:54:39 PM    Final    DG Chest 1 View Result Date: 07/24/2023 CLINICAL DATA:  [id number] Nasogastric tube  present [id number] Central line insertion site infection, initial encounter [id number] EXAM: CHEST  1 VIEW COMPARISON:  July 24, 2023 FINDINGS: The cardiomediastinal silhouette is unchanged and enlarged in contour.Status post TAVR and median sternotomy. Interval placement of a RIGHT neck CVC. There is a somewhat curved contour or of the CVC with tip terminating over the upper heart contour. The tip is parallel with the TAVR. The enteric tube courses through the chest to the abdomen beyond the field-of-view. ETT tip terminates approximately 5 cm above the carina. Small LEFT pleural effusion. No pneumothorax. Patchy perihilar predominant opacities and diffuse interstitial prominence. LEFT retrocardiac opacity. IMPRESSION: 1. Interval placement of a RIGHT neck CVC. There is a somewhat curved contour of the distal CVC with tip terminating over the upper heart contour. The tip is parallel with the TAVR. This is in an uncertain anatomic location with differential considerations including placement within SVC or intra arterial placement. Recommend correlation with placement images and function. If further imaging is desired, a dedicated CT could help delineate the definitive position. 2. Small LEFT pleural effusion with LEFT retrocardiac opacity, favored to reflect atelectasis. 3. Patchy perihilar predominant opacities and diffuse interstitial prominence, similar in comparison prior  and likely reflecting a combination of infection and edema. These results will be called to the ordering clinician or representative by the Radiologist Assistant, and communication documented in the PACS or [employer]. Electronically Signed   By: Meda Klinefelter M.D.   On: 07/24/2023 12:05   CT ABDOMEN PELVIS W CONTRAST Result Date: 07/24/2023 CLINICAL DATA:  78 year old male altered mental status, difficulty breathing and severe hypoxia. EXAM: CT ABDOMEN AND PELVIS WITH CONTRAST TECHNIQUE: Multidetector CT imaging of the abdomen and  pelvis was performed using the standard protocol following bolus administration of intravenous contrast. RADIATION DOSE REDUCTION: This exam was performed according to the departmental dose-optimization program which includes automated exposure control, adjustment of the mA and/or kV according to patient size and/or use of iterative reconstruction technique. CONTRAST:  80mL OMNIPAQUE IOHEXOL 350 MG/ML SOLN COMPARISON:  CTA chest today. FINDINGS: Lower chest: Detailed on CTA chest separately. Hepatobiliary: Small layering gallstones (series 5, image 28). Otherwise negative liver and gallbladder. No bile duct enlargement. Pancreas: Negative. Spleen: Negative. Adrenals/Urinary Tract: Negative adrenal glands and kidneys. Symmetric renal enhancement and contrast excretion. Diminutive ureters. Bladder decompressed by Foley catheter, but diffusely thick-walled. Stomach/Bowel: Redundant large bowel with retained stool mostly in the rectosigmoid colon. Much of the proximal large bowel is decompressed. Normal appendix on coronal image 48. Nondilated small bowel. Enteric tube terminates in the stomach stomach, satisfactory. Mild gastric air in fluid. Decompressed duodenum. No free air, free fluid, mesenteric inflammation. Vascular/Lymphatic: Extensive Aortoiliac calcified atherosclerosis. Major arterial structures in the abdomen and pelvis remain patent. Normal caliber abdominal aorta. Portal venous system is patent. No lymphadenopathy. Reproductive: Urethral catheter in place.  Prostatomegaly. Other: No pelvis free fluid. Musculoskeletal: Lower thoracic Diffuse idiopathic skeletal hyperostosis (DISH). No acute osseous abnormality identified. IMPRESSION: 1. Abnormal Chest CTA today reported separately. Satisfactory enteric tube placement in the stomach. 2. Cholelithiasis. No acute or inflammatory process in the abdomen. 3. Prostatomegaly. Bladder is decompressed by Foley catheter, but is diffusely thick-walled. Consider  chronic bladder outlet obstruction versus cystitis, UTI. 4.  Aortic Atherosclerosis (ICD10-I70.0). Electronically Signed   By: Odessa Fleming M.D.   On: 07/24/2023 08:01   CT Angio Chest Pulmonary Embolism (PE) W or WO Contrast Result Date: 07/24/2023 CLINICAL DATA:  78 year old male altered mental status, difficulty breathing and severe hypoxia. EXAM: CT ANGIOGRAPHY CHEST WITH CONTRAST TECHNIQUE: Multidetector CT imaging of the chest was performed using the standard protocol during bolus administration of intravenous contrast. Multiplanar CT image reconstructions and MIPs were obtained to evaluate the vascular anatomy. RADIATION DOSE REDUCTION: This exam was performed according to the departmental dose-optimization program which includes automated exposure control, adjustment of the mA and/or kV according to patient size and/or use of iterative reconstruction technique. CONTRAST:  80mL OMNIPAQUE IOHEXOL 350 MG/ML SOLN COMPARISON:  Neck CT, CT Abdomen and Pelvis today reported separately. Prior CTA chest 12/31/2021. FINDINGS: Cardiovascular: Good contrast bolus timing in the pulmonary arterial tree. Mild respiratory motion. No pulmonary artery filling defect identified. Previous TAVR, CABG. Calcified aortic atherosclerosis. Little contrast in the aorta. Heart size at the upper limits of normal. No pericardial effusion. Mediastinum/Nodes: No mediastinal mass or lymphadenopathy. Enteric tube courses through the esophagus. Lungs/Pleura: Intubated. Endotracheal tube tip in good position between the clavicles and carina. Below the ETT the bilateral major airways are patent. Bilateral lower lobe consolidation with superimposed small layering pleural effusions. The left pleural effusion however is chronically loculated with pleural thickening and enhancement (series 3, image 101) and not significantly changed from 2023. Superimposed bilateral middle  lobe and upper lobe additional widespread peribronchial and lung opacity  ranging from sub solid to solid. Upper Abdomen: CT Abdomen and Pelvis reported separately. Musculoskeletal: Chronic sternotomy. Thoracic spine degeneration and Diffuse idiopathic skeletal hyperostosis (DISH). No acute osseous abnormality identified. Review of the MIP images confirms the above findings. IMPRESSION: 1. Negative for acute pulmonary embolus. 2. Severe Bilateral Pneumonia, all lobes affected but consolidation worst in both lower lobes. 3. Small layering right pleural effusion, and chronic left fibrothorax related effusion which is stable since 2023. 4. Satisfactory ET tube. CT Abdomen and Pelvis today reported separately. 5.  Aortic Atherosclerosis (ICD10-I70.0).  TAVR, CABG. Electronically Signed   By: Odessa Fleming M.D.   On: 07/24/2023 07:57   CT Soft Tissue Neck Wo Contrast Result Date: 07/24/2023 CLINICAL DATA:  78 year old male altered mental status, difficulty breathing and severe hypoxia. Intubated. EXAM: CT NECK WITHOUT CONTRAST TECHNIQUE: Multidetector CT imaging of the neck was performed following the standard protocol without intravenous contrast. RADIATION DOSE REDUCTION: This exam was performed according to the departmental dose-optimization program which includes automated exposure control, adjustment of the mA and/or kV according to patient size and/or use of iterative reconstruction technique. COMPARISON:  Head CT today. FINDINGS: Pharynx and larynx: Intubated and oral enteric tubes in place and have appropriate visible courses into the airway and esophagus, respectively. Superimposed mild retained fluid in the pharynx. Noncontrast parapharyngeal retropharyngeal spaces appear negative. Salivary glands: Negative noncontrast appearance. Thyroid: Negative. Lymph nodes: No evidence of cervical lymphadenopathy on this noncontrast exam. Vascular: Gas in the left subclavian and internal jugular veins is likely IV access related. Vascular patency is not evaluated in the absence of IV contrast.  Calcified atherosclerosis in the neck and at the skull base. Limited intracranial: Reported separately. Visualized orbits: Not included. Mastoids and visualized paranasal sinuses: Well aerated. Skeleton: Previous sternotomy. No acute osseous abnormality identified. Upper chest: Abnormal, CTA Chest today is reported separately. IMPRESSION: 1. Intubated and oral enteric tubes in place and appropriately course into the airway and esophagus respectively. 2. No other acute or inflammatory process identified in the noncontrast Neck. 3. Chest CTA today reported separately. Electronically Signed   By: Odessa Fleming M.D.   On: 07/24/2023 07:15   CT HEAD WO CONTRAST ( ) Result Date: 07/24/2023 CLINICAL DATA:  78 year old male altered mental status, difficulty breathing and severe hypoxia. EXAM: CT HEAD WITHOUT CONTRAST TECHNIQUE: Contiguous axial images were obtained from the base of the skull through the vertex without intravenous contrast. RADIATION DOSE REDUCTION: This exam was performed according to the departmental dose-optimization program which includes automated exposure control, adjustment of the mA and/or kV according to patient size and/or use of iterative reconstruction technique. COMPARISON:  None Available. FINDINGS: Brain: Cerebral volume is within normal limits for age. No midline shift, ventriculomegaly, mass effect, evidence of mass lesion, intracranial hemorrhage or evidence of cortically based acute infarction. Minimal to mild for age scattered cerebral white matter hypodensity. Hypodensity along the inferior right cerebellum appears to be streak artifact (sagittal image 29). Vascular: Calcified atherosclerosis at the skull base. No suspicious intracranial vascular hyperdensity. Skull: Intact.  No fracture identified. Sinuses/Orbits: Intubated. Minor sinus mucosal thickening. Tympanic cavities and mastoids are clear. Other: Partially visible endotracheal and oral enteric tubes. Associated fluid in the  visible pharynx. No acute orbit or scalp soft tissue finding identified. IMPRESSION: 1. No acute intracranial abnormality or acute traumatic injury identified. 2. Normal for age noncontrast CT appearance of the brain. 3. Intubated. Electronically Signed   By: Rexene Edison  Margo Aye M.D.   On: 07/24/2023 07:10   DG Chest Portable 1 View Result Date: 07/24/2023 CLINICAL DATA:  Post intubation. EXAM: PORTABLE CHEST 1 VIEW COMPARISON:  CTA chest 12/30/2021 FINDINGS: Electrical pad overlies the lower left chest. Overlying monitor wires. 4:40 a.m. ETT in place with tip 6.8 cm from the carina. NGT extends to the left in the stomach with the tip in the fundus. There is mild cardiomegaly. Perihilar vascular congestion and mild generalized interstitial edema are noted consistent with CHF or fluid overload, with small pleural effusions forming. There are hazy perihilar opacities on the right-greater-than-left which are probably due to ground-glass edema, less likely pneumonitis. No other focal infiltrate is seen. Stable mediastinum with aortic atherosclerosis. Old sternotomy and CABG changes. No new osseous findings. Thoracic spondylosis. IMPRESSION: 1. ETT and NGT in place. 2. Cardiomegaly with perihilar vascular congestion and mild generalized interstitial edema consistent with CHF or fluid overload. 3. Hazy perihilar opacities on the right-greater-than-left are probably due to ground-glass edema, less likely pneumonitis. 4. Small pleural effusions. 5. Aortic atherosclerosis. Electronically Signed   By: Almira Bar M.D.   On: 07/24/2023 05:13     ECHO pending     ASSESSMENT AND PLAN:  Principal Problem:   Acute on chronic respiratory failure with hypoxia and hypercapnia (HCC) Active Problems:   Acute sepsis (HCC)   Respiratory acidosis with metabolic acidosis   COVID-19   Acute hypoxic respiratory failure Bilateral community-acquired pneumonia thought to be related to COVID Acute pulmonary edema Acute on chronic  congestive heart failure Septic shock secondary to COVID Elevated troponin possibly demand ischemia Aortic valve replacement TAVR Coronary bypass surgery x 3 Diabetes type 2 Ischemic cardiomyopathy . Respiratory failure possibly due to COVID continue supportive care supplemental oxygen pressors inhalers broad-spectrum robotics therapy agree with critical care and pulmonary input Will lactic acidosis improving from 9 down back to essentially normal Acute renal insufficiency maintain adequate hydration consider follow-up with nephrology Elevated troponin probable demand ischemia will evaluate for non-STEMI continue heparin therapy Multivessel coronary disease including coronary bypass surgery x 3 continue conservative management Aortic valve replacement for aortic stenosis via TAVR bioprosthetic continue current therapy Chronic systolic congestive heart failure on medical therapy with Entresto Plavix Coreg Lasix Diabetes normally on Lantus semaglutide Jardiance currently on hold treated with insulin therapy for now Continue conservative cardiac input at this point  Clotilde Dieter, DO 07/25/2023, 12:32 PM [facility] Card[facility]

## 2023-07-25 NOTE — Progress Notes (Signed)
 Pt extubated to bipap, tolerating well post extubation.

## 2023-07-25 NOTE — Progress Notes (Signed)
 RT at bedside to place patient back in pressure support. Will continue to monitor

## 2023-07-25 NOTE — Progress Notes (Signed)
 Patient placed back to pressure support. More alert, able to follow commands. However still slightly drowsy

## 2023-07-26 ENCOUNTER — Inpatient Hospital Stay

## 2023-07-26 ENCOUNTER — Encounter: Payer: Self-pay | Admitting: Student in an Organized Health Care Education/Training Program

## 2023-07-26 DIAGNOSIS — R531 Weakness: Secondary | ICD-10-CM | POA: Diagnosis not present

## 2023-07-26 DIAGNOSIS — U071 COVID-19: Secondary | ICD-10-CM | POA: Diagnosis not present

## 2023-07-26 DIAGNOSIS — J9620 Acute and chronic respiratory failure, unspecified whether with hypoxia or hypercapnia: Secondary | ICD-10-CM

## 2023-07-26 DIAGNOSIS — E119 Type 2 diabetes mellitus without complications: Secondary | ICD-10-CM | POA: Diagnosis not present

## 2023-07-26 DIAGNOSIS — N179 Acute kidney failure, unspecified: Secondary | ICD-10-CM | POA: Diagnosis not present

## 2023-07-26 LAB — MAGNESIUM: Magnesium: 2.5 mg/dL — ABNORMAL HIGH (ref 1.7–2.4)

## 2023-07-26 LAB — BASIC METABOLIC PANEL
Anion gap: 9 (ref 5–15)
BUN: 29 mg/dL — ABNORMAL HIGH (ref 8–23)
CO2: 21 mmol/L — ABNORMAL LOW (ref 22–32)
Calcium: 8 mg/dL — ABNORMAL LOW (ref 8.9–10.3)
Chloride: 108 mmol/L (ref 98–111)
Creatinine, Ser: 1.22 mg/dL (ref 0.61–1.24)
GFR, Estimated: >60 mL/min (ref >60)
Glucose, Bld: 157 mg/dL — ABNORMAL HIGH (ref 70–99)
Potassium: 4.4 mmol/L (ref 3.5–5.1)
Sodium: 138 mmol/L (ref 135–145)

## 2023-07-26 LAB — CBC
HCT: 43.2 % (ref 39.0–52.0)
Hemoglobin: 14.4 g/dL (ref 13.0–17.0)
MCH: 32 pg (ref 26.0–34.0)
MCHC: 33.3 g/dL (ref 30.0–36.0)
MCV: 96 fL (ref 80.0–100.0)
Platelets: 150 10*3/uL (ref 150–400)
RBC: 4.5 MIL/uL (ref 4.22–5.81)
RDW: 13.7 % (ref 11.5–15.5)
WBC: 11 10*3/uL — ABNORMAL HIGH (ref 4.0–10.5)
nRBC: 0 % (ref 0.0–0.2)

## 2023-07-26 LAB — LACTIC ACID, PLASMA
Lactic Acid, Venous: 5.5 mmol/L (ref 0.5–1.9)
Lactic Acid, Venous: 2.3 mmol/L (ref 0.5–1.9)

## 2023-07-26 LAB — BLOOD GAS, ARTERIAL
Acid-base deficit: 9.8 mmol/L — ABNORMAL HIGH (ref 0.0–2.0)
Bicarbonate: 18 mmol/L — ABNORMAL LOW (ref 20.0–28.0)
Expiratory PAP: 8 cmH2O
FIO2: 100 %
Inspiratory PAP: 12 cmH2O
O2 Saturation: 97.9 %
Patient temperature: 37
pCO2 arterial: 45 mmHg (ref 32–48)
pH, Arterial: 7.21 — ABNORMAL LOW (ref 7.35–7.45)
pO2, Arterial: 93 mmHg (ref 83–108)

## 2023-07-26 LAB — TROPONIN I (HIGH SENSITIVITY): Troponin I (High Sensitivity): 590 ng/L (ref <18)

## 2023-07-26 LAB — GLUCOSE, CAPILLARY
Glucose-Capillary: 156 mg/dL — ABNORMAL HIGH (ref 70–99)
Glucose-Capillary: 130 mg/dL — ABNORMAL HIGH (ref 70–99)
Glucose-Capillary: 174 mg/dL — ABNORMAL HIGH (ref 70–99)
Glucose-Capillary: 291 mg/dL — ABNORMAL HIGH (ref 70–99)
Glucose-Capillary: 179 mg/dL — ABNORMAL HIGH (ref 70–99)

## 2023-07-26 LAB — BLOOD GAS, VENOUS
Acid-base deficit: 1.1 mmol/L (ref 0.0–2.0)
Bicarbonate: 25.3 mmol/L (ref 20.0–28.0)
O2 Saturation: 95 %
Patient temperature: 37
pCO2, Ven: 48 mmHg (ref 44–60)
pH, Ven: 7.33 (ref 7.25–7.43)
pO2, Ven: 69 mmHg — ABNORMAL HIGH (ref 32–45)

## 2023-07-26 LAB — LEGIONELLA PNEUMOPHILA SEROGP 1 UR AG: L. pneumophila Serogp 1 Ur Ag: NEGATIVE

## 2023-07-26 LAB — HEPARIN LEVEL (UNFRACTIONATED)
Heparin Unfractionated: 0.58 [IU]/mL (ref 0.30–0.70)
Heparin Unfractionated: 0.69 [IU]/mL (ref 0.30–0.70)

## 2023-07-26 LAB — PHOSPHORUS: Phosphorus: 2.7 mg/dL (ref 2.5–4.6)

## 2023-07-26 LAB — CULTURE, RESPIRATORY W GRAM STAIN: Culture: NORMAL

## 2023-07-26 MED ORDER — INSULIN ASPART 100 UNIT/ML IJ SOLN
0.0000 [IU] | Freq: Three times a day (TID) | INTRAMUSCULAR | Status: DC
  Administered 2023-07-26: 2 [IU] via SUBCUTANEOUS
  Administered 2023-07-26: 5 [IU] via SUBCUTANEOUS
  Administered 2023-07-27: 3 [IU] via SUBCUTANEOUS
  Administered 2023-07-27: 1 [IU] via SUBCUTANEOUS
  Administered 2023-07-27 – 2023-07-28 (×3): 2 [IU] via SUBCUTANEOUS
  Administered 2023-07-29: 1 [IU] via SUBCUTANEOUS
  Administered 2023-07-29 (×2): 2 [IU] via SUBCUTANEOUS
  Administered 2023-07-30 (×2): 1 [IU] via SUBCUTANEOUS
  Administered 2023-07-30 – 2023-07-31 (×3): 2 [IU] via SUBCUTANEOUS
  Administered 2023-07-31: 3 [IU] via SUBCUTANEOUS
  Administered 2023-08-01: 2 [IU] via SUBCUTANEOUS
  Administered 2023-08-01: 3 [IU] via SUBCUTANEOUS
  Filled 2023-07-26 (×18): qty 1

## 2023-07-26 MED ORDER — ATORVASTATIN CALCIUM 80 MG PO TABS
80.0000 mg | ORAL_TABLET | Freq: Every day | ORAL | Status: DC
  Administered 2023-07-27 – 2023-07-31 (×5): 80 mg via ORAL
  Filled 2023-07-26 (×5): qty 1

## 2023-07-26 MED ORDER — IOHEXOL 350 MG/ML SOLN
75.0000 mL | Freq: Once | INTRAVENOUS | Status: AC | PRN
  Administered 2023-07-26: 75 mL via INTRAVENOUS

## 2023-07-26 MED ORDER — CLOPIDOGREL BISULFATE 75 MG PO TABS
75.0000 mg | ORAL_TABLET | Freq: Every evening | ORAL | Status: DC
  Administered 2023-07-26 – 2023-07-31 (×6): 75 mg via ORAL
  Filled 2023-07-26 (×6): qty 1

## 2023-07-26 MED ORDER — STROKE: EARLY STAGES OF RECOVERY BOOK: Freq: Once | Status: AC

## 2023-07-26 MED ORDER — SODIUM BICARBONATE 8.4 % IV SOLN
100.0000 meq | Freq: Once | INTRAVENOUS | Status: AC
Start: 2023-07-26 — End: 2023-07-26
  Administered 2023-07-26: 100 meq via INTRAVENOUS
  Filled 2023-07-26: qty 100

## 2023-07-26 MED ORDER — ENSURE MAX PROTEIN PO LIQD
11.0000 [oz_av] | Freq: Every day | ORAL | Status: DC
  Administered 2023-07-27 – 2023-07-31 (×4): 11 [oz_av] via ORAL
  Filled 2023-07-26: qty 330

## 2023-07-26 MED ORDER — ASPIRIN 81 MG PO CHEW
81.0000 mg | CHEWABLE_TABLET | Freq: Every day | ORAL | Status: DC
  Administered 2023-07-26 – 2023-08-01 (×7): 81 mg via ORAL
  Filled 2023-07-26 (×7): qty 1

## 2023-07-26 MED ORDER — CARVEDILOL 3.125 MG PO TABS
3.1250 mg | ORAL_TABLET | Freq: Two times a day (BID) | ORAL | Status: DC
  Administered 2023-07-27 – 2023-08-01 (×11): 3.125 mg via ORAL
  Filled 2023-07-26 (×12): qty 1

## 2023-07-26 MED ORDER — FUROSEMIDE 10 MG/ML IJ SOLN
40.0000 mg | INTRAMUSCULAR | Status: AC
  Administered 2023-07-26: 40 mg via INTRAVENOUS
  Filled 2023-07-26: qty 4

## 2023-07-26 MED ORDER — METOPROLOL TARTRATE 5 MG/5ML IV SOLN
5.0000 mg | INTRAVENOUS | Status: AC
  Administered 2023-07-26: 5 mg via INTRAVENOUS
  Filled 2023-07-26: qty 5

## 2023-07-26 MED ORDER — SODIUM CHLORIDE 0.9 % IV SOLN: INTRAVENOUS | Status: AC | PRN

## 2023-07-26 MED ORDER — ENOXAPARIN SODIUM 40 MG/0.4ML IJ SOSY
40.0000 mg | PREFILLED_SYRINGE | INTRAMUSCULAR | Status: DC
  Administered 2023-07-26 – 2023-07-31 (×6): 40 mg via SUBCUTANEOUS
  Filled 2023-07-26 (×6): qty 0.4

## 2023-07-26 MED ORDER — GLUCERNA SHAKE PO LIQD
237.0000 mL | Freq: Two times a day (BID) | ORAL | Status: DC
  Administered 2023-07-26 – 2023-08-01 (×8): 237 mL via ORAL

## 2023-07-26 MED ORDER — INSULIN ASPART 100 UNIT/ML IJ SOLN
0.0000 [IU] | Freq: Every day | INTRAMUSCULAR | Status: DC
  Administered 2023-07-29: 2 [IU] via SUBCUTANEOUS
  Administered 2023-07-31: 3 [IU] via SUBCUTANEOUS
  Filled 2023-07-26 (×5): qty 1

## 2023-07-26 MED ORDER — SODIUM CHLORIDE 0.9% FLUSH
3.0000 mL | Freq: Two times a day (BID) | INTRAVENOUS | Status: DC
  Administered 2023-07-27 – 2023-07-28 (×3): 10 mL via INTRAVENOUS
  Administered 2023-07-28: 5 mL via INTRAVENOUS
  Administered 2023-07-29: 3 mL via INTRAVENOUS
  Administered 2023-07-29: 10 mL via INTRAVENOUS
  Administered 2023-07-30 (×2): 3 mL via INTRAVENOUS
  Administered 2023-07-31: 10 mL via INTRAVENOUS
  Administered 2023-07-31: 3 mL via INTRAVENOUS

## 2023-07-26 MED ORDER — SODIUM CHLORIDE 0.9 % IV SOLN
2.0000 g | INTRAVENOUS | Status: AC
  Administered 2023-07-26 – 2023-07-31 (×6): 2 g via INTRAVENOUS
  Filled 2023-07-26 (×8): qty 20

## 2023-07-26 MED ORDER — ADULT MULTIVITAMIN W/MINERALS CH
1.0000 | ORAL_TABLET | Freq: Every day | ORAL | Status: DC
  Administered 2023-07-26 – 2023-08-01 (×7): 1 via ORAL
  Filled 2023-07-26 (×8): qty 1

## 2023-07-26 MED ORDER — FUROSEMIDE 10 MG/ML IJ SOLN
40.0000 mg | Freq: Once | INTRAMUSCULAR | Status: AC
  Administered 2023-07-26: 40 mg via INTRAVENOUS
  Filled 2023-07-26: qty 4

## 2023-07-26 MED ORDER — SODIUM CHLORIDE 0.9% FLUSH: 3.0000 mL | INTRAVENOUS | Status: DC | PRN

## 2023-07-26 NOTE — Progress Notes (Signed)
 CODE STROKE- PHARMACY COMMUNICATION   Time CODE STROKE called/page received: On-site at approx 3:35  Time response to CODE STROKE was made (in person or via phone): in-person  Time Stroke Kit retrieved from [employer] (only if needed): No TNK indicated   Name of Provider/Nurse contacted:Dr. Brooke Dare  Past Medical History:  Diagnosis Date   CHF (congestive heart failure) (HCC)    Diabetes mellitus without complication (HCC)    Hypertension    MI (myocardial infarction) (HCC)    Prior to Admission medications   Medication Sig Start Date End Date Taking? Authorizing Provider  aspirin 81 MG chewable tablet Chew 1 tablet (81 mg total) by mouth daily. 01/02/22  Yes Wouk, Wilfred Curtis, MD  atorvastatin (LIPITOR) 80 MG tablet Take 80 mg by mouth at bedtime.   Yes [provider]  carvedilol (COREG) 3.125 MG tablet Take 1 tablet (3.125 mg total) by mouth in the morning and at bedtime. 05/16/20 07/25/23 Yes Narda Bonds, MD  clopidogrel (PLAVIX) 75 MG tablet Take 1 tablet (75 mg total) by mouth every evening. 09/22/20  Yes Hackney, Tina A, FNP  FLUoxetine (PROZAC) 40 MG capsule Take 40 mg by mouth daily.   Yes [provider]  furosemide (LASIX) 20 MG tablet Take 3 tablets (60 mg total) by mouth daily. Patient taking differently: Take 20 mg by mouth daily. Take along with one 40 mg tablet for total 60 mg dose once daily 05/16/20 07/25/23 Yes Narda Bonds, MD  furosemide (LASIX) 40 MG tablet Take 40 mg by mouth daily. Take along with one 20 mg tablet for total 60 mg dose once daily 12/28/21  Yes [provider]  insulin glargine (LANTUS) 100 UNIT/ML Solostar Pen Inject 40 Units into the skin at bedtime.   Yes [provider]  isosorbide mononitrate (IMDUR) 30 MG 24 hr tablet Take 1 tablet (30 mg total) by mouth daily. 01/02/22  Yes Wouk, Wilfred Curtis, MD  JARDIANCE 25 MG TABS tablet Take 25 mg by mouth daily. 12/28/21  Yes [provider]   sacubitril-valsartan (ENTRESTO) 49-51 MG Take 1 tablet by mouth in the morning and at bedtime.   Yes [provider]  Semaglutide, 1 MG/DOSE, (OZEMPIC, 1 MG/DOSE,) 4 MG/3ML SOPN Inject 1 mg into the skin once a week. Sunday 11/09/21   [provider]    Effie Shy, PharmD Pharmacy Resident  07/26/2023 3:45 PM

## 2023-07-26 NOTE — Progress Notes (Signed)
 PT Cancellation Note  Patient Details Name: Adam Ferguson MRN: [id number] DOB: [birth year]   Cancelled Treatment:    Reason Eval/Treat Not Completed: Medical issues which prohibited therapy, pt with respiratory entering room, pt just placed on bipap. PT to re-attempt as able.    Olga Coaster PT, DPT 2:19 PM,07/26/23

## 2023-07-26 NOTE — Progress Notes (Signed)
 [facility] CLINIC CARDIOLOGY PROGRESS NOTE   Patient ID: Adam Ferguson MRN: [id number] DOB/AGE: [birth year] 78 y.o.  Admit date: 07/24/2023 Referring Physician Dr Larinda Buttery Primary Physician Mick Sell, MD Primary Cardiologist Paraschos Reason for Consultation respiratory failure cardiomyopathy elevated troponins   HPI: 78 year old male coronary disease coronary bypass surgery March 2019 TAVR for aortic stenosis 2019 hypertension diabetes systolic congestive heart failure cardiomyopathy ischemic hyperlipidemia obesity presents via EMS with acute dyspnea and hypoxemia patient was intubated and sedated in the emergency room with severe hypoxemia and found to have COVID and what appeared to be bilateral pneumonia presumably related to COVID patient had slightly elevated troponins EKG was nondiagnostic BNP was elevated at 1500.   Interval History:  -Patient seen and examined at bedside. No events overnight. Resting comfortably in bedside chair.  -No complaints of chest pain or SOB. Reports some cough, remains on supplemental O2.  -BP and HR controlled.   Review of systems complete and found to be negative unless listed above    Vitals:   07/26/23 1100 07/26/23 1200 07/26/23 1300 07/26/23 1420  BP: 125/77 129/75 128/68   Pulse: 91 81 76 (!) 130  Resp: 19 (!) 22  (!) 30  Temp:  98 F (36.7 C)    TempSrc:  Oral    SpO2: 95% 95% 97% 95%  Weight:      Height:         Intake/Output Summary (Last 24 hours) at 07/26/2023 1459 Last data filed at 07/26/2023 1342 Gross per 24 hour  Intake 2079.12 ml  Output 1605 ml  Net 474.12 ml     PHYSICAL EXAM General:  well nourished, in no acute distress. HEENT: Normocephalic and atraumatic. Neck: No JVD.  Lungs: Normal respiratory effort. Clear bilaterally to auscultation. No wheezes, crackles, rhonchi.  Heart: HRRR. Normal S1 and S2 without gallops or murmurs. Radial & DP pulses 2+ bilaterally. Abdomen: Non-distended appearing.  Msk:  Normal strength and tone for age. Extremities: No clubbing, cyanosis or edema.   Neuro: Alert and oriented X 3. Psych: Mood appropriate, affect congruent.    LABS: Basic Metabolic Panel: Recent Labs    07/25/23 0624 07/25/23 1643 07/26/23 0414  NA 138  --  138  K 4.6  --  4.4  CL 106  --  108  CO2 20*  --  21*  GLUCOSE 236*  --  157*  BUN 28*  --  29*  CREATININE 1.59*  --  1.22  CALCIUM 7.8*  --  8.0*  MG 2.3 2.3 2.5*  PHOS 3.8 2.8 2.7   Liver Function Tests: Recent Labs    07/24/23 0417  AST 50*  ALT 43  ALKPHOS 61  BILITOT 1.2  PROT 7.7  ALBUMIN 4.0   No results for input(s): "LIPASE", "AMYLASE" in the last 72 hours. CBC: Recent Labs    07/24/23 0417 07/25/23 0624 07/26/23 0414  WBC 9.4 15.7* 11.0*  NEUTROABS 3.5  --   --   HGB 17.9* 15.4 14.4  HCT 57.2* 47.3 43.2  MCV 102.7* 97.9 96.0  PLT 217 193 150   Cardiac Enzymes: Recent Labs    07/24/23 1407 07/24/23 1951 07/24/23 2316  TROPONINIHS 2,648* 2,540* 2,233*   BNP: Recent Labs    07/24/23 0417  BNP 1,512.0*   D-Dimer: No results for input(s): "DDIMER" in the last 72 hours. Hemoglobin A1C: Recent Labs    07/24/23 0735  HGBA1C 7.3*   Fasting Lipid Panel: No results for input(s): "CHOL", "HDL", "LDLCALC", "TRIG", "CHOLHDL", "  LDLDIRECT" in the last 72 hours. Thyroid Function Tests: No results for input(s): "TSH", "T4TOTAL", "T3FREE", "THYROIDAB" in the last 72 hours.  Invalid input(s): "FREET3" Anemia Panel: No results for input(s): "VITAMINB12", "FOLATE", "FERRITIN", "TIBC", "IRON", "RETICCTPCT" in the last 72 hours.  DG Abd 1 View Result Date: 07/25/2023 CLINICAL DATA:  OG tube placement EXAM: ABDOMEN - 1 VIEW COMPARISON:  CT 07/24/2023 FINDINGS: Enteric tube tip and side port overlie the gastric fundal region. Upper gas pattern is unremarkable IMPRESSION: Enteric tube tip and side port overlie the gastric fundal region Electronically Signed   By: Jasmine Pang M.D.   On: 07/25/2023  15:34   DG Chest 1 View Result Date: 07/25/2023 CLINICAL DATA:  ARDS. EXAM: CHEST  1 VIEW COMPARISON:  07/24/2023 FINDINGS: There is a right IJ catheter with tip projecting over the distal SVC. Enteric tube tip is in the stomach. ET tube tip is above the carina. Stable cardiomediastinal contours. Lung volumes are low. Pulmonary vascular congestion. No airspace consolidation. IMPRESSION: 1. Low lung volumes and pulmonary vascular congestion. 2. Support apparatus as above. Electronically Signed   By: Signa Kell M.D.   On: 07/25/2023 09:10     ECHO 07/24/23: 1. Left ventricular ejection fraction, by estimation, is 20 to 25%. Left ventricular ejection fraction by 2D MOD biplane is 20.4 %. The left ventricle has severely decreased function. The left ventricle has no regional wall motion abnormalities. The left ventricular internal cavity size was moderately dilated. Left ventricular diastolic parameters are indeterminate.   2. Right ventricular systolic function is mildly reduced. The right  ventricular size is normal.   3. The mitral valve is normal in structure. Mild mitral valve  regurgitation. No evidence of mitral stenosis.   4. The aortic valve is normal in structure. Aortic valve regurgitation is not visualized. No aortic stenosis is present. Aortic valve mean gradient measures 7.0 mmHg.   5. The inferior vena cava is normal in size with greater than 50%  respiratory variability, suggesting right atrial pressure of 3 mmHg.   ASSESSMENT AND PLAN:  Principal Problem:   Acute on chronic respiratory failure with hypoxia and hypercapnia (HCC) Active Problems:   Acute sepsis (HCC)   Respiratory acidosis with metabolic acidosis   COVID-19   Acute hypoxic respiratory failure Bilateral community-acquired pneumonia thought to be related to COVID Acute pulmonary edema Acute on chronic congestive heart failure Septic shock secondary to COVID Elevated troponin possibly demand ischemia Aortic  valve replacement TAVR Coronary bypass surgery x 3 Diabetes type 2 Ischemic cardiomyopathy  Respiratory failure possibly due to COVID continue supportive care supplemental oxygen pressors inhalers broad-spectrum robotics therapy agree with critical care and pulmonary input Lactic acidosis improving from 9 down back to essentially normal Acute renal insufficiency maintain adequate hydration consider follow-up with nephrology Elevated troponin probable demand ischemia, discontinue heparin today. Completed 48 hours. Multivessel coronary disease including coronary bypass surgery x 3 continue conservative management Aortic valve replacement for aortic stenosis via TAVR bioprosthetic continue current therapy Chronic systolic congestive heart failure on medical therapy resume home meds as able Diabetes normally on Lantus semaglutide Jardiance currently on hold treated with insulin therapy for now Continue conservative cardiac input at this point  Cardiology will sign off. Please haiku with questions or re-engage if needed.    This patient's plan of care was discussed and created with Dr. Melton Alar and she is in agreement.    Signed: Gale Journey, PA-C 07/26/2023, 2:59 PM [facility] Cardiology

## 2023-07-26 NOTE — Consult Note (Signed)
 PHARMACY - ANTICOAGULATION CONSULT NOTE  Pharmacy Consult for Heparin Indication: chest pain/ACS  No Known Allergies  Patient Measurements: Height: 6' (182.9 cm) Weight: 109.5 kg (241 lb 6.5 oz) IBW/kg (Calculated) : 77.6 Heparin Dosing Weight: 115.5 kg  Vital Signs: Temp: 99.5 F (37.5 C) (03/11 0000) Temp Source: Bladder (03/11 0000) BP: 133/67 (03/11 0000) Pulse Rate: 69 (03/11 0000)  Labs: Recent Labs    07/24/23 0417 07/24/23 0747 07/24/23 1016 07/24/23 1407 07/24/23 1951 07/24/23 2316 07/24/23 2316 07/25/23 0624 07/25/23 1537 07/25/23 2344  HGB 17.9*  --   --   --   --   --   --  15.4  --   --   HCT 57.2*  --   --   --   --   --   --  47.3  --   --   PLT 217  --   --   --   --   --   --  193  --   --   APTT  --  25  --   --   --   --   --   --   --   --   LABPROT  --  13.7  --   --   --   --   --   --   --   --   INR  --  1.0  --   --   --   --   --   --   --   --   HEPARINUNFRC  --   --   --   --   --  0.59   < > 0.85* 0.68 0.69  CREATININE 1.65*  --   --   --   --  1.55*  --  1.59*  --   --   TROPONINIHS 50* 427*   < > 2,648* 2,540* 2,233*  --   --   --   --    < > = values in this interval not displayed.    Estimated Creatinine Clearance: 50.5 mL/min (A) (by C-G formula based on SCr of 1.59 mg/dL (H)).   Medical History: Past Medical History:  Diagnosis Date   CHF (congestive heart failure) (HCC)    Diabetes mellitus without complication (HCC)    Hypertension    MI (myocardial infarction) (HCC)     Medications:  No history of chronic anticoagulant use PTA  Assessment: 78 year old Caucasian male with a past medical history that is significant for CAD s/p coronary artery bypass graft x 3, type 2 diabetes, congestive heart failure, aortic stenosis s/p TAVR hypertension, and hyperlipidemia who presented to the ED via EMS with complaints of acute dyspnea and hypoxemia. Patient is currently intubated and sedated hence history is obtained from ED and EMS  documentation. Cardiology has been consulted due to elevated troponin levels [phone]. Pharmacy has been consulted to initiate and monitor continuous heparin infusion.  Baseline labs:  aPTT 25sec, INR 1.0, Plts 217, Hgb 17.9  Goal of Therapy:  Heparin level 0.3-0.7 units/ml Monitor platelets by anticoagulation protocol: Yes  Heparin Levels  Date/Time  HL  Clinical Assessment 3/09@2316   0.59  Therapeutic x 1 3/10@0624   0.85  SUPRAtherapeutic  3/10@1537   0.68  Therapeutic x 1 3/10@2344                  0.69                Therapeutic X 2  Heparin started 3/9 ~ 1430 for ACS  Plan:  --- Continue heparin drip at 1450 units/hr  --- recheck HL on 3/11 with AM labs --- Follow Cardiology for duration of treatment --- Continue to monitor H&H and platelets  Raidon Swanner D, PharmD Clinical Pharmacist  07/26/2023 12:23 AM

## 2023-07-26 NOTE — Consult Note (Signed)
 NEUROLOGY CONSULT NOTE   Date of service: July 26, 2023 Patient Name: Adam Ferguson MRN:  [id number] DOB:  [birth year] Chief Complaint: "Larey Seat off ICU bed" Requesting Provider: Janann Colonel, MD  History of Present Illness  Genaro Bekker is a 78 y.o. male with hx of coronary artery disease s/p CABG x 3 (2019), congestive heart failure (NYHA class III) with reduced EF 20 to 25% with cardiology considering ICD placement, aortic stenosis s/p TAVR (2019), hypertension, hyperlipidemia, type 2 diabetes  He was admitted 07/24/2023 for respiratory distress initial O2 saturations of 44%, emergently intubated on arrival in the ED and found to have COVID-19.  Course complicated by AKI (improving), NSTEMI for which he had been on heparin drip per ACS protocol (does have a TAVR as well but appears to be on chronic DAPT and not on anticoagulation per cardiology notes).  Extubated 3/10 and was planned to graduate to the floor today  He was still on BiPAP today when at approximately 3:35 PM he was found to have rolled out of bed disconnected himself from the BiPAP in the process and found facedown on the floor unresponsive.  He was assisted back into the bed by staff and there was some concern for possible unilateral weakness but by the time of my evaluation at 3:45 PM was mentating at his baseline without focal deficits  LKW: 3:30 PM Modified rankin score: Unclear at this time IV Thrombolysis: No, on heparin drip recently EVT: No, exam not consistent with LVO NIH stroke scale: 0   ROS  Limited review of systems in the emergent setting.  Patient reported he knew he was here in the hospital for COVID infection and difficulty with his breathing but did not recall falling out of bed   Past History   Past Medical History:  Diagnosis Date   CHF (congestive heart failure) (HCC)    Diabetes mellitus without complication (HCC)    Hypertension    MI (myocardial infarction) (HCC)     Past Surgical  History:  Procedure Laterality Date   AORTIC VALVE REPLACEMENT (AVR)/CORONARY ARTERY BYPASS GRAFTING (CABG)     CATARACT EXTRACTION W/PHACO Right 08/03/2021   Procedure: CATARACT EXTRACTION PHACO AND INTRAOCULAR LENS PLACEMENT (IOC) RIGHT DIABETIC 9.61 01:37.1;  Surgeon: Nevada Crane, MD;  Location: [facility];  Service: Ophthalmology;  Laterality: Right;  Diabetic   CATARACT EXTRACTION W/PHACO Left 08/17/2021   Procedure: CATARACT EXTRACTION PHACO AND INTRAOCULAR LENS PLACEMENT (IOC) LEFT DIABETIC 3.33 00:33.1;  Surgeon: Nevada Crane, MD;  Location: [facility];  Service: Ophthalmology;  Laterality: Left;  Diabetic   STENT PLACEMENT VASCULAR ([facility] HX)     VALVE REPLACEMENT      Family History: Family History  Problem Relation Age of Onset   Other Neg Hx     Social History  reports that he has never smoked. He has never used smokeless tobacco. He reports that he does not currently use alcohol. He reports that he does not currently use drugs.  No Known Allergies  Medications   Current Facility-Administered Medications:    0.9 %  sodium chloride infusion, , Intravenous, PRN, Assaker, [place] Bali, MD, Stopped at 07/26/23 0449   acetaminophen (TYLENOL) suppository 650 mg, 650 mg, Rectal, Q6H PRN, Ezequiel Essex, NP   aspirin chewable tablet 81 mg, 81 mg, Oral, Daily, Hudson, Caralyn, PA-C   atorvastatin (LIPITOR) tablet 80 mg, 80 mg, Oral, QHS, Hudson, Caralyn, PA-C   carvedilol (COREG) tablet 3.125 mg, 3.125 mg, Oral, BID WC, Hudson,  Caralyn, PA-C   cefTRIAXone (ROCEPHIN) 2 g in sodium chloride 0.9 % 100 mL IVPB, 2 g, Intravenous, Q24H, Effie Shy, RPH   Chlorhexidine Gluconate Cloth 2 % PADS 6 each, 6 each, Topical, Daily, Dgayli, Khabib, MD, 6 each at 07/26/23 0400   clopidogrel (PLAVIX) tablet 75 mg, 75 mg, Oral, QPM, Hudson, Caralyn, PA-C   dexamethasone (DECADRON) injection 6 mg, 6 mg, Intravenous, Q24H, Dgayli, Khabib, MD, 6 mg at 07/25/23 1737    docusate (COLACE) 50 MG/5ML liquid 100 mg, 100 mg, Oral, BID PRN, Ezequiel Essex, NP   enoxaparin (LOVENOX) injection 40 mg, 40 mg, Subcutaneous, Q24H, Assaker, Jean-Pierre, MD, 40 mg at 07/26/23 1326   feeding supplement (GLUCERNA SHAKE) (GLUCERNA SHAKE) liquid 237 mL, 237 mL, Oral, BID BM, Assaker, Jean-Pierre, MD, 237 mL at 07/26/23 1327   insulin aspart (novoLOG) injection 0-5 Units, 0-5 Units, Subcutaneous, QHS, Nelson, Neldon Newport, NP   insulin aspart (novoLOG) injection 0-9 Units, 0-9 Units, Subcutaneous, TID WC, Ezequiel Essex, NP, 2 Units at 07/26/23 1211   insulin glargine (LANTUS) injection 10 Units, 10 Units, Subcutaneous, BID, Ezequiel Essex, NP, 10 Units at 07/26/23 0925   ipratropium-albuterol (DUONEB) 0.5-2.5 (3) MG/3ML nebulizer solution 3 mL, 3 mL, Nebulization, Q6H PRN, Tukov-Yual, Magdalene S, NP   multivitamin with minerals tablet 1 tablet, 1 tablet, Oral, Daily, Assaker, Jean-Pierre, MD, 1 tablet at 07/26/23 1324   Oral care mouth rinse, 15 mL, Mouth Rinse, 4 times per day, Assaker, [place] B[place], MD, 15 mL at 07/26/23 1112   Oral care mouth rinse, 15 mL, Mouth Rinse, PRN, Assaker, [place] Bali, MD   polyethylene glycol (MIRALAX / GLYCOLAX) packet 17 g, 17 g, Oral, Daily PRN, Ezequiel Essex, NP   protein supplement (ENSURE MAX) liquid, 11 oz, Oral, QHS, Assaker, Jean-Pierre, MD   sodium chloride flush (NS) 0.9 % injection 10-40 mL, 10-40 mL, Intracatheter, Q12H, Dgayli, Khabib, MD, 10 mL at 07/26/23 0825   sodium chloride flush (NS) 0.9 % injection 10-40 mL, 10-40 mL, Intracatheter, PRN, Dgayli, Khabib, MD   sodium chloride flush (NS) 0.9 % injection 3-10 mL, 3-10 mL, Intravenous, Q12H, Ezequiel Essex, NP   sodium chloride flush (NS) 0.9 % injection 3-10 mL, 3-10 mL, Intravenous, PRN, Ezequiel Essex, NP   vancomycin (VANCOREADY) IVPB 1500 mg/300 mL, 1,500 mg, Intravenous, Q24H, Effie Shy, RPH, Stopped at 07/26/23 0146  Vitals   Vitals:   07/26/23 1300 07/26/23 1400 07/26/23  1420 07/26/23 1500  BP: 128/68 (!) 166/102  (!) 147/87  Pulse: 76 95 (!) 130 (!) 105  Resp:   (!) 30 (!) 22  Temp:      TempSrc:      SpO2: 97% 90% 95% 95%  Weight:      Height:        Body mass index is 29.3 kg/m.  Physical Exam   Constitutional: Appears chronically ill, obese Psych: Affect calm and cooperative HENT: BiPAP in place.  Able to rotate neck bilaterally without pain.  No pain on palpation of the spine.  No complaints of pain anywhere else Head: No obvious trauma Cardiovascular: Normal rate and regular rhythm.  Respiratory: Tolerating BiPAP well GI: Protuberant.   Neurologic Examination   See NIH stroke scale above.  Mental status, patient able to name, repeat, oriented to situation, age, month, following commands  Cranial nerves: Pupils equal round reactive light, EOMI, face symmetric within limits of BiPAP  Motor: No drift in all 4 extremities  Sensory: Intact to light  touch throughout  Coordination: Intact heel-to-shin bilaterally and intact finger-to-nose bilaterally  Labs/Imaging/Neurodiagnostic studies   CBC:  Recent Labs  Lab 08/08/23 0417 07/25/23 0624 07/26/23 0414  WBC 9.4 15.7* 11.0*  NEUTROABS 3.5  --   --   HGB 17.9* 15.4 14.4  HCT 57.2* 47.3 43.2  MCV 102.7* 97.9 96.0  PLT 217 193 150   Basic Metabolic Panel:  Lab Results  Component Value Date   NA 138 07/26/2023   K 4.4 07/26/2023   CO2 21 (L) 07/26/2023   GLUCOSE 157 (H) 07/26/2023   BUN 29 (H) 07/26/2023   CREATININE 1.22 07/26/2023   CALCIUM 8.0 (L) 07/26/2023   GFRNONAA >60 07/26/2023   Lipid Panel:  Lab Results  Component Value Date   LDLCALC 62 12/31/2021   HgbA1c:  Lab Results  Component Value Date   HGBA1C 7.3 (H) 08/08/2023   INR  Lab Results  Component Value Date   INR 1.0 08-08-2023   APTT  Lab Results  Component Value Date   APTT 25 08-08-2023    CT Head without contrast(Personally reviewed): 1. 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 2. Mild chronic small vessel ischemic changes within the cerebral white matter. 3. Minor paranasal sinus mucosal thickening.  CT angio Head and Neck with contrast(Personally reviewed): Pending  Echo Aug 08, 2023  1. Left ventricular ejection fraction, by estimation, is 20 to 25%. Left  ventricular ejection fraction by 2D MOD biplane is 20.4 %. The left  ventricle has severely decreased function. The left ventricle has no  regional wall motion abnormalities. The  left ventricular internal cavity size was moderately dilated. Left  ventricular diastolic parameters are indeterminate.   2. Right ventricular systolic function is mildly reduced. The right  ventricular size is normal.   3. The mitral valve is normal in structure. Mild mitral valve  regurgitation. No evidence of mitral stenosis.   4. The aortic valve is normal in structure. Aortic valve regurgitation is  not visualized. No aortic stenosis is present. Aortic valve mean gradient  measures 7.0 mmHg.   5. The inferior vena cava is normal in size with greater than 50%  respiratory variability, suggesting right atrial pressure of 3 mmHg.   ASSESSMENT   Issam Carlyon is a 78 y.o. male admitted with respiratory failure secondary to COVID-19 infection, improving but today had an unwitnessed fall out of the hospital bed.  Unclear if he may have had some focal weakness immediately after the event but this did resolve rapidly, with NIH of 0 not a candidate for emergent intervention  RECOMMENDATIONS  -Emergent head CT obtained to rule out ICH obtained with findings of cerebellar hypodensity as above -Given findings of hypodensity, would complete stroke workup with CTA head and neck -A1c slightly above goal at 7.3%;  -Fasting lipid panel ordered, goal LDL less than 70, adjust medications as needed to meet goal -MRI brain to confirm head CT finding; favored to be a true finding but  occasionally this area of the brain is prone to artifact -Given low EF on echocardiogram would strongly consider a central embolic /cardiac source of this cerebellar stroke if confirmed on MRI and may need to consider anticoagulation pending MRI results -Hold off on heparin drip pending MRI brain -Appreciate cardiology/primary team review of the telemetry for any arrhythmias that may have contributed to this presentation -If no clear etiology for his fall out of bed is determined, consider routine EEG  for completeness -Neurology will follow along ______________________________________________________________________  Signed, Gordy Councilman, MD [facility]  CRITICAL CARE Performed by: Gordy Councilman   Total critical care time: 30 minutes  Critical care time was exclusive of separately billable procedures and treating other patients.  Critical care was necessary to treat or prevent imminent or life-threatening deterioration -- emergent evaluation for acute neurological change  Critical care was time spent personally by me on the following activities: development of treatment plan with patient and/or surrogate as well as nursing, discussions with consultants, evaluation of patient's response to treatment, examination of patient, obtaining history from patient or surrogate, ordering and performing treatments and interventions, ordering and review of laboratory studies, ordering and review of radiographic studies, pulse oximetry and re-evaluation of patient's condition.

## 2023-07-26 NOTE — Evaluation (Signed)
 Occupational Therapy Evaluation Patient Details Name: Adam Ferguson MRN: [id number] DOB: 08/29/1945 Today's Date: 07/26/2023   History of Present Illness   Pt is a 78 year old malepresented to [facility] on 03/09 with acute hypoxic respiratory failure requiring intubation mechanical ventilation on 03/09 and admission to ICU; Work up includes bilateral pneumonia, hypokalemia, hyperglycemia, elevated BNP suggestive of acute CHF exacerbation and bilateral pleural effusions.  Postintubation, patient developed septic shock requiring pressors; Pt is Covid +    PMH significant for CAD status post CABG x 3, type 2 diabetes, congestive heart failure, severe aortic stenosis status post TAVR, hypertension and hyperlipidemia     Clinical Impressions Chart reviewed, pt greeted in bed, alert and oriented x4, agreeable to OT evaluation. 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 Pt presents with deficits in strength, endurance,activity tolerance, balance, affecting safe and optimal ADL completion. Bed mobility completed with CGA, STS with CGA, short amb transfer to bedside chair with CGA. MAX A required for LB dressing, SET UP for grooming/feeding tasks. Mild dizziness throughout, BP monitored and MAP >65 throughout. Spo2 >90% on 2L via [place]. Discussed with and educated patient re: discharge recommendations/assist for ADLs/DME use for safe ADL completion. All questions answered within scope. Pt is left in bedside chair, all needs met. Pt will benefit from acute OT to address deficits and to facilitate return to PLOF. OT will continue to follow.      If plan is discharge home, recommend the following:   A little help with walking and/or transfers;A little help with bathing/dressing/bathroom;Assistance with cooking/housework;Help with stairs or ramp for entrance;Assist for transportation     Functional Status Assessment   Patient has had a recent decline in their  functional status and demonstrates the ability to make significant improvements in function in a reasonable and predictable amount of time.     Equipment Recommendations   BSC/3in1;Tub/shower seat;Other (comment) (2WW)     Recommendations for Other Services         Precautions/Restrictions   Precautions Precautions: Fall Recall of Precautions/Restrictions: Intact Restrictions Weight Bearing Restrictions Per Provider Order: No     Mobility Bed Mobility Overal bed mobility: Needs Assistance Bed Mobility: Supine to Sit     Supine to sit: Contact guard, HOB elevated, Used rails     General bed mobility comments: intermittent vcs for technique    Transfers Overall transfer level: Needs assistance Equipment used: Rolling walker (2 wheels) Transfers: Sit to/from Stand Sit to Stand: Contact guard assist           General transfer comment: intermittent vcs for technique with RW, from regular surface      Balance Overall balance assessment: Needs assistance Sitting-balance support: Feet supported Sitting balance-Leahy Scale: Good     Standing balance support: Bilateral upper extremity supported, During functional activity, Reliant on assistive device for balance Standing balance-Leahy Scale: Fair                             ADL either performed or assessed with clinical judgement   ADL Overall ADL's : Needs assistance/impaired Eating/Feeding: Set up;Sitting   Grooming: Wash/dry face;Sitting;Set up               Lower Body Dressing: Maximal assistance;Bed level Lower Body Dressing Details (indicate cue type and reason): donn socks Toilet Transfer: Contact guard assist;Rolling walker (2 wheels) Toilet Transfer Details (indicate  cue type and reason): step pivot to bedside chair, simulated                 Vision Baseline Vision/History: 1 Wears glasses Patient Visual Report: No change from baseline       Perception          Praxis         Pertinent Vitals/Pain Pain Assessment Pain Assessment: No/denies pain     Extremity/Trunk Assessment Upper Extremity Assessment Upper Extremity Assessment: Overall WFL for tasks assessed   Lower Extremity Assessment Lower Extremity Assessment: Generalized weakness       Communication Communication Communication: No apparent difficulties   Cognition Arousal: Alert Behavior During Therapy: WFL for tasks assessed/performed Cognition: No apparent impairments                                       Cueing  General Comments   Cueing Techniques: Verbal cues  BP monitored in semi supine, sitting on edge of bed, sitting in chair and MAP >65 throughout; Spo2 >90% on 2 L throughout, HR in 80s bpm during mobility   Exercises Other Exercises Other Exercises: edu re: role of OT, role of rehab, dicscussed at length dc recommendations   Shoulder Instructions      Home Living Family/patient expects to be discharged to:: Private residence Living Arrangements: Spouse/significant other Available Help at Discharge: Family (son lives out of town) Type of Home: House Home Access: Stairs to enter Secretary/administrator of Steps: 3 Entrance Stairs-Rails: Left;Right Home Layout: Two level;Able to live on main level with bedroom/bathroom Alternate Level Stairs-Number of Steps: sleeps in the bedroom downstairs- full bathroom downstairs but he uses bathroom upstairs Alternate Level Stairs-Rails: Right Bathroom Shower/Tub: Walk-in shower;Tub/shower unit   Bathroom Toilet: Standard Bathroom Accessibility: Yes   Home Equipment: None   Additional Comments: wife has  RW      Prior Functioning/Environment Prior Level of Function : Independent/Modified Independent;Driving                    OT Problem List: Decreased strength;Decreased activity tolerance;Impaired balance (sitting and/or standing);Decreased safety awareness;Decreased knowledge of use  of DME or AE;Cardiopulmonary status limiting activity   OT Treatment/Interventions: Self-care/ADL training;Balance training;Therapeutic exercise;DME and/or AE instruction;Patient/family education;Neuromuscular education;Energy conservation      OT Goals(Current goals can be found in the care plan section)   Acute Rehab OT Goals Patient Stated Goal: go home OT Goal Formulation: With patient Time For Goal Achievement: 08/09/23 Potential to Achieve Goals: Fair ADL Goals Pt Will Perform Grooming: with modified independence;sitting;standing Pt Will Perform Lower Body Dressing: sit to/from stand;with modified independence;sitting/lateral leans Pt Will Transfer to Toilet: with modified independence;ambulating Pt Will Perform Toileting - Clothing Manipulation and hygiene: with modified independence;sit to/from stand;sitting/lateral leans   OT Frequency:  Min 1X/week    Co-evaluation              AM-PAC OT "6 Clicks" Daily Activity     Outcome Measure Help from another person eating meals?: None Help from another person taking care of personal grooming?: None Help from another person toileting, which includes using toliet, bedpan, or urinal?: A Lot Help from another person bathing (including washing, rinsing, drying)?: A Lot Help from another person to put on and taking off regular upper body clothing?: A Little Help from another person to put on and taking off regular lower body clothing?: A  Lot 6 Click Score: 17   End of Session Equipment Utilized During Treatment: Gait belt;Rolling walker (2 wheels);Oxygen Nurse Communication: Mobility status  Activity Tolerance: Patient tolerated treatment well Patient left: in chair;with call bell/phone within reach;with chair alarm set  OT Visit Diagnosis: Other abnormalities of gait and mobility (R26.89);Unsteadiness on feet (R26.81)                Time: 1027-2536 OT Time Calculation (min): 32 min Charges:  OT General Charges $OT  Visit: 1 Visit OT Evaluation $OT Eval Moderate Complexity: 1 Mod  Oleta Mouse, OTD OTR/L  07/26/23, 10:42 AM

## 2023-07-26 NOTE — Plan of Care (Addendum)
 The patient remains in AR-ICU at time of writing. The patient remains on continuous BiPAP at this time. In order to assess the patient's readiness and potential tolerance for MRI, at ~ 2045 hours this RN placed the patient on NRB mask (off BiPAP) and lowered the patient's HOB to flat. Within 5 minutes, the patient became diaphoretic and endorsed shortness of breath, the patient's WOB and RR also increased while his SPO2 dropped from 100% to 94%. The patient was returned to a HOB position of > 30 degrees but no improvement in symptoms were noted so the patient was returned to BiPAP immediately and the provider was notified. An order was received for IV lasix, to attempt to optimize the patient's respiratory status for MRI with a plan to attempt a "flat test" later tonight. Also related to the patient's respiratory status, nursing has not been able to complete the ordered [facility] Screen; will address when patient can tolerate coming off BiPAP.    Problem: Education: Goal: Knowledge of risk factors and measures for prevention of condition will improve Outcome: Progressing   Problem: Coping: Goal: Psychosocial and spiritual needs will be supported Outcome: Progressing   Problem: Respiratory: Goal: Will maintain a patent airway Outcome: Progressing   Problem: Fluid Volume: Goal: Hemodynamic stability will improve Outcome: Progressing   Problem: Education: Goal: Knowledge of General Education information will improve Description: Including pain rating scale, medication(s)/side effects and non-pharmacologic comfort measures Outcome: Progressing   Problem: Health Behavior/Discharge Planning: Goal: Ability to manage health-related needs will improve Outcome: Progressing   Problem: Clinical Measurements: Goal: Will remain free from infection Outcome: Progressing Goal: Cardiovascular complication will be avoided Outcome: Progressing   Problem: Coping: Goal: Level of anxiety will  decrease Outcome: Progressing   Problem: Elimination: Goal: Will not experience complications related to bowel motility Outcome: Progressing   Problem: Pain Managment: Goal: General experience of comfort will improve and/or be controlled Outcome: Progressing   Problem: Safety: Goal: Ability to remain free from injury will improve Outcome: Progressing   Problem: Education: Goal: Ability to describe self-care measures that may prevent or decrease complications (Diabetes Survival Skills Education) will improve Outcome: Progressing Goal: Individualized Educational Video(s) Outcome: Progressing   Problem: Coping: Goal: Ability to adjust to condition or change in health will improve Outcome: Progressing   Problem: Fluid Volume: Goal: Ability to maintain a balanced intake and output will improve Outcome: Progressing   Problem: Health Behavior/Discharge Planning: Goal: Ability to identify and utilize available resources and services will improve Outcome: Progressing Goal: Ability to manage health-related needs will improve Outcome: Progressing   Problem: Metabolic: Goal: Ability to maintain appropriate glucose levels will improve Outcome: Progressing   Problem: Nutritional: Goal: Progress toward achieving an optimal weight will improve Outcome: Progressing   Problem: Education: Goal: Ability to demonstrate management of disease process will improve Outcome: Progressing Goal: Ability to verbalize understanding of medication therapies will improve Outcome: Progressing Goal: Individualized Educational Video(s) Outcome: Progressing   Problem: Education: Goal: Knowledge of disease or condition will improve Outcome: Progressing Goal: Knowledge of secondary prevention will improve (MUST DOCUMENT ALL) Outcome: Progressing Goal: Knowledge of patient specific risk factors will improve (DELETE if not current risk factor) Outcome: Progressing   Problem: Ischemic Stroke/TIA  Tissue Perfusion: Goal: Complications of ischemic stroke/TIA will be minimized Outcome: Progressing   Problem: Coping: Goal: Will verbalize positive feelings about self Outcome: Progressing Goal: Will identify appropriate support needs Outcome: Progressing   Problem: Health Behavior/Discharge Planning: Goal: Ability to manage health-related needs will  improve Outcome: Progressing Goal: Goals will be collaboratively established with patient/family Outcome: Progressing   Problem: Self-Care: Goal: Ability to participate in self-care as condition permits will improve Outcome: Progressing Goal: Verbalization of feelings and concerns over difficulty with self-care will improve Outcome: Progressing Goal: Ability to communicate needs accurately will improve Outcome: Progressing

## 2023-07-26 NOTE — Progress Notes (Signed)
   07/26/23 1500  Spiritual Encounters  Type of Visit Initial  Care provided to: Patient  Conversation partners present during encounter Nurse  Referral source Code page  Reason for visit Trauma  OnCall Visit No   Chaplain responded to a code Stroke. When chaplain arrived there were no family members and the medical staff were working on the patient. Chaplain services is available for spiritual and emotional resources.

## 2023-07-26 NOTE — Progress Notes (Signed)
 Date and time results received: 07/26/23 1640   Test: lactic acid Critical Value: 5.5   Name of Provider Notified: Zada Girt NP

## 2023-07-26 NOTE — Consult Note (Signed)
 PHARMACY - ANTICOAGULATION CONSULT NOTE  Pharmacy Consult for Heparin Indication: chest pain/ACS  No Known Allergies  Patient Measurements: Height: 6' (182.9 cm) Weight: 109.5 kg (241 lb 6.5 oz) IBW/kg (Calculated) : 77.6 Heparin Dosing Weight: 115.5 kg  Vital Signs: Temp: 98.2 F (36.8 C) (03/11 0400) Temp Source: Bladder (03/11 0400) BP: 127/77 (03/11 0500) Pulse Rate: 63 (03/11 0600)  Labs: Recent Labs    07/24/23 0417 07/24/23 0417 07/24/23 0747 07/24/23 1016 07/24/23 1407 07/24/23 1951 07/24/23 2316 07/25/23 0624 07/25/23 1537 07/25/23 2344 07/26/23 0414  HGB 17.9*  --   --   --   --   --   --  15.4  --   --  14.4  HCT 57.2*  --   --   --   --   --   --  47.3  --   --  43.2  PLT 217  --   --   --   --   --   --  193  --   --  150  APTT  --   --  25  --   --   --   --   --   --   --   --   LABPROT  --   --  13.7  --   --   --   --   --   --   --   --   INR  --   --  1.0  --   --   --   --   --   --   --   --   HEPARINUNFRC  --    < >  --   --   --   --  0.59 0.85* 0.68 0.69 0.58  CREATININE 1.65*  --   --   --   --   --  1.55* 1.59*  --   --  1.22  TROPONINIHS 50*  --  427*   < > 2,648* 2,540* 2,233*  --   --   --   --    < > = values in this interval not displayed.    Estimated Creatinine Clearance: 65.9 mL/min (by C-G formula based on SCr of 1.22 mg/dL).   Medical History: Past Medical History:  Diagnosis Date   CHF (congestive heart failure) (HCC)    Diabetes mellitus without complication (HCC)    Hypertension    MI (myocardial infarction) (HCC)     Medications:  No history of chronic anticoagulant use PTA  Assessment: 78 year old Caucasian male with a past medical history that is significant for CAD s/p coronary artery bypass graft x 3, type 2 diabetes, congestive heart failure, aortic stenosis s/p TAVR hypertension, and hyperlipidemia who presented to the ED via EMS with complaints of acute dyspnea and hypoxemia. Patient is currently intubated  and sedated hence history is obtained from ED and EMS documentation. Cardiology has been consulted due to elevated troponin levels [id number]. Pharmacy has been consulted to initiate and monitor continuous heparin infusion.  Baseline labs:  aPTT 25sec, INR 1.0, Plts 217, Hgb 17.9  Goal of Therapy:  Heparin level 0.3-0.7 units/ml Monitor platelets by anticoagulation protocol: Yes  Heparin Levels  Date/Time  HL  Clinical Assessment 3/09@2316   0.59  Therapeutic x 1 3/10@0624   0.85  SUPRAtherapeutic  3/10@1537   0.68  Therapeutic x 1 3/10@2344                  0.69  Therapeutic X 2  3/11@0414                  0.58                Therapeutic X 3    Heparin started 3/9 ~ 1430 for ACS  Plan:  --- Continue heparin drip at 1450 units/hr  --- recheck HL on 3/12 with AM labs --- Follow Cardiology for duration of treatment --- Continue to monitor H&H and platelets  Oletta Buehring D, PharmD Clinical Pharmacist  07/26/2023 6:07 AM

## 2023-07-26 NOTE — Progress Notes (Signed)
 Nutrition Follow-up  DOCUMENTATION CODES:   Obesity unspecified  INTERVENTION:   -Continue carb modified diet -Glucerna Shake po BID, each supplement provides 220 kcal and 10 grams of protein  -Ensure Max po daily, each supplement provides 150 kcal and 30 grams of protein  -MVI with minerals daily   NUTRITION DIAGNOSIS:   Inadequate oral intake related to inability to eat as evidenced by NPO status.  Progressing; advanced to PO diet on 07/25/23  GOAL:   Patient will meet greater than or equal to 90% of their needs  Progressing   MONITOR:   PO intake, Supplement acceptance  REASON FOR ASSESSMENT:   Consult, Ventilator Enteral/tube feeding initiation and management  ASSESSMENT:   Pt with a past medical history that is significant for CAD s/p coronary artery bypass graft x 3, type 2 diabetes, congestive heart failure, aortic stenosis s/p TAVR hypertension, and hyperlipidemia who presented with complaints of acute dyspnea and hypoxemia.  3/10- extubated, advanced to full liquid diet 3/11- advanced to carb modified diet  Reviewed I/O's: +108 ml x 24 hours and -681 ml since admission  UOP: 1.7 L x 24 hours  Pt extubated yesterday afternoon. He is currently on a carb modified diet. No meal completion data available to assess at this time. RD will add supplements to optimize nutritional intake.   Palliative care consult pending for goals of care.   Medications reviewed and include decadron and protonix.   Labs reviewed: CBGS: 115-156 (inpatient orders for glycemic control are 0-9 units insulin aspart every 4 hours and 10 units insulin glargine BID).    Diet Order:   Diet Order             Diet Carb Modified Fluid consistency: Thin; Room service appropriate? Yes  Diet effective now                   EDUCATION NEEDS:   Not appropriate for education at this time  Skin:  Skin Assessment: Reviewed RN Assessment  Last BM:  07/24/23  Height:   Ht Readings  from Last 1 Encounters:  07/24/23 6' (1.829 m)    Weight:   Wt Readings from Last 1 Encounters:  07/26/23 98 kg    Ideal Body Weight:  80.9 kg  BMI:  Body mass index is 29.3 kg/m.  Estimated Nutritional Needs:   Kcal:  2200-2400  Protein:  120-135 grams  Fluid:  2-2.2 L    Levada Schilling, RD, LDN, CDCES Registered Dietitian III Certified Diabetes Care and Education Specialist If unable to reach this RD, please use "RD Inpatient" group chat on secure chat between hours of 8am-4 pm daily

## 2023-07-26 NOTE — Significant Event (Signed)
 Significant Event   Pt found face down on the floor after having having an unwitnessed fall from his bed.  Upon arrival at bedside pt disconnected from Bipap with O2 sats in the mid 50's.  Pts eyes were open, but  unable to follow commands.  Pt connected back to Bipap and O2 increased to mid 90's. He eventually was able follow commands on the RUE and BLE, however initially unable to follow commands on the LUE.  He eventually was able to follow commands in the LUE.  Code Stroke initatiated Neurology assess pt.  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 CT Abd/Pelvis concerning for worsening pneumonia. CTA Head/Neck pending.  Family updated regarding plan of care and pts condition.  Will continue to monitor and assess pt.   Zada Girt, AGNP  Pulmonary/Critical Care Pager [phone] (please enter 7 digits) PCCM Consult Pager [phone] (please enter 7 digits)

## 2023-07-26 NOTE — Progress Notes (Addendum)
 07/26/23 1630  What Happened  Was fall witnessed? No  Was patient injured? No  Patient found on floor  Found by Staff-comment  Stated prior activity other (comment) (laying in the bed)  Provider Notification  Provider Name/Title Zada Girt NP  Date Provider Notified 07/26/23  Time Provider Notified 1531  Method of Notification Face-to-face  Notification Reason Fall  Test performed and critical result CT head  Date Critical Result Received 07/26/23  Time Critical Result Received 1617  Provider response At bedside  Date of Provider Response 07/26/23  Time of Provider Response 1617  Follow Up  Family notified Yes - comment  Time family notified 1718  Additional tests Yes-comment (CT angio neck, CT abdomen)  Simple treatment Other (comment) (none)  Progress note created (see row info) Yes  Adult Fall Risk Assessment  Risk Factor Category (scoring not indicated) Fall has occurred during this admission (document High fall risk)  Patient Fall Risk Level High fall risk  Adult Fall Risk Interventions  Required Bundle Interventions *See Row Information* High fall risk - low, moderate, and high requirements implemented  Additional Interventions Room near nurses station  Fall intervention(s) refused/Patient educated regarding refusal Bed alarm;Nonskid socks  Screening for Fall Injury Risk (To be completed on HIGH fall risk patients) - Assessing Need for Floor Mats  Risk For Fall Injury- Criteria for Floor Mats None identified - No additional interventions needed  Will Implement Floor Mats Yes  Vitals  BP (!) 142/91  MAP (mmHg) 101  Pulse Rate (!) 110  ECG Heart Rate (!) 109  Oxygen Therapy  SpO2 97 %  O2 Device Bi-PAP  FiO2 (%) 100 %  Pain Assessment  Pain Scale 0-10  Pain Score 0  Neurological  Neuro (WDL) X  Level of Consciousness Alert  Orientation Level Oriented X4  Cognition Appropriate at baseline  Speech Clear  R Pupil Size (mm) 3  R Pupil Shape Round  R Pupil  Reaction Brisk  L Pupil Size (mm) 3  L Pupil Shape Round  L Pupil Reaction Brisk  Motor Function/Sensation Assessment Grip  R Hand Grip Strong  L Hand Grip Strong  RUE Motor Response Purposeful movement  LUE Motor Response Purposeful movement  RLE Motor Response Purposeful movement  LLE Motor Response Purposeful movement  Neuro Symptoms None  Neuro Additional Assessments Glasgow Coma Scale  Glasgow Coma Scale  Eye Opening 4  Best Verbal Response (NON-intubated) 5  Best Motor Response 6  Glasgow Coma Scale Score 15  NIH Stroke Scale   Dizziness Present No  Headache Present No  Interval Other (Comment)  Level of Consciousness (1a.)    0  LOC Questions (1b. )    0  LOC Commands (1c. )    0  Best Gaze (2. )   0  Visual (3. )   0  Facial Palsy (4. )     0  Motor Arm, Left (5a. )    0  Motor Arm, Right (5b. )  0  Motor Leg, Left (6a. )   0  Motor Leg, Right (6b. )  0  Limb Ataxia (7. ) 0  Sensory (8. )   0  Best Language (9. )   0  Dysarthria (10. ) 0  Extinction/Inattention (11.)    0  Complete NIHSS TOTAL 0  Musculoskeletal  Musculoskeletal (WDL) X  Assistive Device None  Generalized Weakness Yes  Weight Bearing Restrictions Per Provider Order No  Integumentary  Integumentary (WDL) X  Skin  Color Appropriate for ethnicity  Skin Condition Dry  Skin Integrity Ecchymosis  Ecchymosis Location Arm  Ecchymosis Location Orientation Bilateral  Skin Turgor Non-tenting  At 1530 pt noted to be face down in the floor by charge nurse while rounding. Pt's Bi-pap and telemetry stickers noted to be off. Last time pt was seen in the bed was at 1520 by [facility]. At time of assessment pt was diaphoretic and not responsive to pain stimulation, initially unable to follow commands on the LUE. He eventually was able to follow commands in the LUE code stroke was call.

## 2023-07-26 NOTE — Progress Notes (Signed)
 NAME:  Adam Ferguson, MRN:  [id number], DOB:  14-Feb-1946, LOS: 2 ADMISSION DATE:  07/24/2023,  CHIEF COMPLAINT:  Acute toxic respiratory failure    HPI  Adam Ferguson is a 78 year old Caucasian male with a past medical history that is significant for CAD s/p coronary artery bypass graft x 3, type 2 diabetes, congestive heart failure, aortic stenosis s/p TAVR hypertension, and hyperlipidemia who presented to the ED via EMS with complaints of acute dyspnea and hypoxemia.  Patient is currently intubated and sedated hence history is obtained from ED and EMS documentation.  Per EMS documentation, patient's wife called EMS because patient was having difficulty breathing.  Upon EMS arrival, patient's SpO2 was 44% on room air as he was placed on CPAP and transferred to the ED.  Of note, patient was exposed to COVID-19.  Patient lives with his wife who has dementia. ED Course: Upon arrival in the ED, patient's SpO2 was 44% on CPAP.  He was emergently intubated.  Postintubation, patient was difficult to oxygenate requiring paralytics.  ED workup showed bilateral pneumonia, hypokalemia, hyperglycemia, elevated BNP suggestive of acute CHF exacerbation and bilateral pleural effusions.  Postintubation, patient developed septic shock requiring pressors. Initial vital signs showed HR of  73 beats/minute, BP 102/91 mmHg, the RR 37 breaths/minute, and the oxygen saturation 47% on CPAP and a temperature of 97.73F (36.6C). Patient was tripoding, speaking in one-word sentences and anxious.  He was emergently intubated upon arrival in the ED.  Patient was extremely difficult intubation.    Pertinent Labs/Diagnostics Findings: Na+ 136/ K+ 3.2:  Glucose: 313 BUN/Cr.:  1.65/20, calcium: 8.5, AST/ALT: 15/43, WBC: 9.4 without bands or neutrophil predominance   Hgb/Hct: Plts: 17.9/57.06/2015 Lactic acid: 9.0 COVID PCR: Positive troponin: 50 BNP: 1512 ABG postintubation: pO2 117; pCO2 51; pH 7.26;  HCO3 22., %O2 Sat 99.8 on 100%  FIO2  CXR> LL consolidation, BL pleural effusions and Pulmonary edema CTH> Negative for acute CVA CTA Chest> Negative for PE but bilateral pneumonia, BL pleural effusions CT neck-soft tissues: negative CT Abd/pelvis> cholelithiasis and prostatomegaly; bladder wall thickening  Past Medical History   Past Medical History:  Diagnosis Date   CHF (congestive heart failure) (HCC)    Diabetes mellitus without complication (HCC)    Hypertension    MI (myocardial infarction) (HCC)     Social History   Socioeconomic History   Marital status: Married    Spouse name: Not on file   Number of children: Not on file   Years of education: Not on file   Highest education level: Not on file  Occupational History   Not on file  Tobacco Use   Smoking status: Never   Smokeless tobacco: Never  Vaping Use   Vaping status: Never Used  Substance and Sexual Activity   Alcohol use: Not Currently   Drug use: Not Currently   Sexual activity: Not Currently    Birth control/protection: None  Other Topics Concern   Not on file  Social History Narrative   Not on file   Social Drivers of Health   Financial Resource Strain: Low Risk  (04/18/2023)   Received from [facility]   Overall Financial Resource Strain (CARDIA)    Difficulty of Paying Living Expenses: Not hard at all  Food Insecurity: Patient Unable To Answer (07/24/2023)   Hunger Vital Sign    Worried About Running Out of Food in the Last Year: Patient unable to answer    Ran Out of Food in the  Last Year: Patient unable to answer  Transportation Needs: Patient Unable To Answer (07/24/2023)   PRAPARE - Transportation    Lack of Transportation (Medical): Patient unable to answer    Lack of Transportation (Non-Medical): Patient unable to answer  Physical Activity: Unknown (12/29/2017)   Received from [facility]   Exercise Vital Sign    Days of Exercise per Week: 3 days    Minutes of Exercise per Session:  Not on file  Stress: No Stress Concern Present (12/29/2017)   Received from [facility], [facility] - Occupational Stress Questionnaire    Feeling of Stress : Only a little  Social Connections: Patient Unable To Answer (07/24/2023)   Social Connection and Isolation Panel [NHANES]    Frequency of Communication with Friends and Family: Patient unable to answer    Frequency of Social Gatherings with Friends and Family: Patient unable to answer    Attends Religious Services: Patient unable to answer    Active Member of Clubs or Organizations: Patient unable to answer    Attends Banker Meetings: Patient unable to answer    Marital Status: Patient unable to answer  Intimate Partner Violence: Patient Unable To Answer (07/24/2023)   Humiliation, Afraid, Rape, and Kick questionnaire    Fear of Current or Ex-Partner: Patient unable to answer    Emotionally Abused: Patient unable to answer    Physically Abused: Patient unable to answer    Sexually Abused: Patient unable to answer     Significant Hospital Events   07/24/2023: ED with acute hypoxic respiratory failure due to COVID-19 infection 07/25/2023: Pt remains mechanically intubated and able to follow commands during WUA.  Performed SBT pt initially had adequate tidal volumes, however he later became lethargic and apneic prompting backup rate.  Once mentation improves will perform SBT again   Micro Data:  03/09: COVID-19 + 03/09: Influenza PCR>>negative 03/09: Blood culture x2>>NGTD 03/09: MRSA PCR>>positive   03/09: Strep pneumo urinary antigen>>negative   Anti-infectives (From admission, onward)    Start     Dose/Rate Route Frequency Ordered Stop   07/25/23 1000  remdesivir 100 mg in sodium chloride 0.9 % 100 mL IVPB  Status:  Discontinued       Placed in "Followed by" Linked Group   100 mg 200 mL/hr over 30 Minutes Intravenous Daily 07/24/23 1812 07/24/23 1825   07/25/23 0000   vancomycin (VANCOREADY) IVPB 1500 mg/300 mL        1,500 mg 150 mL/hr over 120 Minutes Intravenous Every 24 hours 07/24/23 0549 07/31/23 2359   07/24/23 2000  ceFEPIme (MAXIPIME) 2 g in sodium chloride 0.9 % 100 mL IVPB        2 g 200 mL/hr over 30 Minutes Intravenous Every 12 hours 07/24/23 0549 07/31/23 0759   07/24/23 1900  remdesivir 200 mg in sodium chloride 0.9% 250 mL IVPB  Status:  Discontinued       Placed in "Followed by" Linked Group   200 mg 580 mL/hr over 30 Minutes Intravenous Once 07/24/23 1812 07/24/23 1825   07/24/23 1000  azithromycin (ZITHROMAX) 500 mg in sodium chloride 0.9 % 250 mL IVPB        500 mg 250 mL/hr over 60 Minutes Intravenous Every 24 hours 07/24/23 0904     07/24/23 0600  metroNIDAZOLE (FLAGYL) IVPB 500 mg  Status:  Discontinued        500 mg 100 mL/hr over 60 Minutes Intravenous Every  12 hours 07/24/23 0537 07/24/23 0904   07/24/23 0545  ceFEPIme (MAXIPIME) 2 g in sodium chloride 0.9 % 100 mL IVPB        2 g 200 mL/hr over 30 Minutes Intravenous  Once 07/24/23 0537 07/24/23 0608   07/24/23 0545  vancomycin (VANCOREADY) IVPB 2000 mg/400 mL        2,000 mg 200 mL/hr over 120 Minutes Intravenous  Once 07/24/23 0537 07/24/23 0956      SUBJECTIVE:   As outlined above under significant events   OBJECTIVE  Blood pressure 137/64, pulse 67, temperature 98.2 F (36.8 C), temperature source Oral, resp. rate 13, height 6' (1.829 m), weight 98 kg, SpO2 96%. CVP:  [4 mmHg-18 mmHg] 10 mmHg  Vent Mode: PSV FiO2 (%):  [28 %-30 %] 28 % Set Rate:  [18 bmp] 18 bmp Vt Set:  [495 mL] 495 mL PEEP:  [5 cmH20] 5 cmH20 Pressure Support:  [5 cmH20] 5 cmH20   Intake/Output Summary (Last 24 hours) at 07/26/2023 0914 Last data filed at 07/26/2023 0731 Gross per 24 hour  Intake 2063.37 ml  Output 2155 ml  Net -91.63 ml   Filed Weights   07/25/23 0500 07/25/23 0851 07/26/23 0620  Weight: 95 kg 109.5 kg 98 kg   Physical Examination  GENERAL: Acutely-ill appearing  male, NAD on 1L O2 via nasal canula  HEENT: Head atraumatic, normocephalic. No JVD LUNGS: Diminished throughout, even, non labored  CARDIO: NSR, s1s2, no r/g, 2+ radial/1+distal pulses, no edema  ABDOMEN: +BS x4, soft, non distended, non tender  EXTREMITIES: Normal bulk and tone, moves all extremities  NEUROLOGIC: Alert and oriented, following commands, PERRLA  SKIN: No obvious rash, lesion, or ulcer. Warm to touch GU: Indwelling foley catheter draining yellow urine   Labs/imaging that I havepersonally reviewed  (right click and "Reselect all SmartList Selections" daily)    Labs   CBC: Recent Labs  Lab 07/24/23 0417 07/25/23 0624 07/26/23 0414  WBC 9.4 15.7* 11.0*  NEUTROABS 3.5  --   --   HGB 17.9* 15.4 14.4  HCT 57.2* 47.3 43.2  MCV 102.7* 97.9 96.0  PLT 217 193 150   Basic Metabolic Panel: Recent Labs  Lab 07/24/23 0417 07/24/23 0747 07/24/23 2316 07/25/23 0624 07/25/23 1643 07/26/23 0414  NA 136  --  137 138  --  138  K 3.2*  --  4.3 4.6  --  4.4  CL 99  --  106 106  --  108  CO2 20*  --  20* 20*  --  21*  GLUCOSE 313*  --  206* 236*  --  157*  BUN 20  --  24* 28*  --  29*  CREATININE 1.65*  --  1.55* 1.59*  --  1.22  CALCIUM 8.5*  --  7.6* 7.8*  --  8.0*  MG  --  2.1  --  2.3 2.3 2.5*  PHOS  --   --   --  3.8 2.8 2.7   GFR: Estimated Creatinine Clearance: 62.5 mL/min (by C-G formula based on SCr of 1.22 mg/dL). Recent Labs  Lab 07/24/23 0417 07/24/23 0747 07/24/23 1016 07/24/23 1407 07/24/23 1951 07/24/23 2316 07/25/23 0624 07/26/23 0414  WBC 9.4  --   --   --   --   --  15.7* 11.0*  LATICACIDVEN  --    < > 2.4* 1.2 1.1 1.1  --   --    < > = values in this interval not displayed.  Liver Function Tests: Recent Labs  Lab 07/24/23 0417  AST 50*  ALT 43  ALKPHOS 61  BILITOT 1.2  PROT 7.7  ALBUMIN 4.0   No results for input(s): "LIPASE", "AMYLASE" in the last 168 hours. No results for input(s): "AMMONIA" in the last 168 hours.  ABG     Component Value Date/Time   PHART 7.34 (L) 07/25/2023 1506   PCO2ART 42 07/25/2023 1506   PO2ART 82 (L) 07/25/2023 1506   HCO3 22.7 07/25/2023 1506   ACIDBASEDEF 3.0 (H) 07/25/2023 1506   O2SAT 98.1 07/25/2023 1506     Coagulation Profile: Recent Labs  Lab 07/24/23 0747  INR 1.0    Cardiac Enzymes: No results for input(s): "CKTOTAL", "CKMB", "CKMBINDEX", "TROPONINI" in the last 168 hours.  HbA1C: Hgb A1c MFr Bld  Date/Time Value Ref Range Status  07/24/2023 07:35 AM 7.3 (H) 4.8 - 5.6 % Final    Comment:    (NOTE) Pre diabetes:          5.7%-6.4%  Diabetes:              >6.4%  Glycemic control for   <7.0% adults with diabetes   12/30/2021 03:06 PM 6.5 (H) 4.8 - 5.6 % Final    Comment:    (NOTE) Pre diabetes:          5.7%-6.4%  Diabetes:              >6.4%  Glycemic control for   <7.0% adults with diabetes     CBG: Recent Labs  Lab 07/25/23 1552 07/25/23 1924 07/25/23 2342 07/26/23 0425 07/26/23 0759  GLUCAP 115* 132* 163* 156* 130*    Review of Systems: Positives in BOLD   Gen: Denies fever, chills, weight change, fatigue, night sweats HEENT: Denies blurred vision, double vision, hearing loss, tinnitus, sinus congestion, rhinorrhea, sore throat, neck stiffness, dysphagia PULM: Denies shortness of breath, cough, sputum production, hemoptysis, wheezing CV: Denies chest pain, edema, orthopnea, paroxysmal nocturnal dyspnea, palpitations GI: Denies abdominal pain, nausea, vomiting, diarrhea, hematochezia, melena, constipation, change in bowel habits GU: Denies dysuria, hematuria, polyuria, oliguria, urethral discharge Endocrine: Denies hot or cold intolerance, polyuria, polyphagia or appetite change Derm: Denies rash, dry skin, scaling or peeling skin change Heme: Denies easy bruising, bleeding, bleeding gums Neuro: Denies headache, numbness, weakness, slurred speech, loss of memory or consciousness  Past Medical History  He,  has a past medical  history of CHF (congestive heart failure) (HCC), Diabetes mellitus without complication (HCC), Hypertension, and MI (myocardial infarction) (HCC).   Surgical History    Past Surgical History:  Procedure Laterality Date   AORTIC VALVE REPLACEMENT (AVR)/CORONARY ARTERY BYPASS GRAFTING (CABG)     CATARACT EXTRACTION W/PHACO Right 08/03/2021   Procedure: CATARACT EXTRACTION PHACO AND INTRAOCULAR LENS PLACEMENT (IOC) RIGHT DIABETIC 9.61 01:37.1;  Surgeon: Nevada Crane, MD;  Location: [facility];  Service: Ophthalmology;  Laterality: Right;  Diabetic   CATARACT EXTRACTION W/PHACO Left 08/17/2021   Procedure: CATARACT EXTRACTION PHACO AND INTRAOCULAR LENS PLACEMENT (IOC) LEFT DIABETIC 3.33 00:33.1;  Surgeon: Nevada Crane, MD;  Location: [facility];  Service: Ophthalmology;  Laterality: Left;  Diabetic   STENT PLACEMENT VASCULAR ([facility] HX)     VALVE REPLACEMENT      Social History   reports that he has never smoked. He has never used smokeless tobacco. He reports that he does not currently use alcohol. He reports that he does not currently use drugs.   Family History   His  family history is negative for Other.   Allergies No Known Allergies   Home Medications  Prior to Admission medications   Medication Sig Start Date End Date Taking? Authorizing Provider  aspirin 81 MG chewable tablet Chew 1 tablet (81 mg total) by mouth daily. 01/02/22  Yes Wouk, Wilfred Curtis, MD  ascorbic acid (VITAMIN C) 500 MG tablet Take 500 mg by mouth daily.    [provider]  atorvastatin (LIPITOR) 80 MG tablet Take 80 mg by mouth at bedtime.    [provider]  B Complex-C-Folic Acid (RENAL) 1 MG CAPS Take 1 capsule by mouth every evening.    [provider]  carvedilol (COREG) 3.125 MG tablet Take 1 tablet (3.125 mg total) by mouth in the morning and at bedtime. 05/16/20 12/30/21  Narda Bonds, MD  clopidogrel (PLAVIX) 75 MG tablet Take 1 tablet (75 mg total) by  mouth every evening. 09/22/20   Delma Freeze, FNP  furosemide (LASIX) 20 MG tablet Take 3 tablets (60 mg total) by mouth daily. Patient taking differently: Take 20 mg by mouth daily. Take along with one 40 mg tablet for total 60 mg dose once daily 05/16/20 12/30/21  Narda Bonds, MD  furosemide (LASIX) 40 MG tablet Take 40 mg by mouth daily. Take along with one 20 mg tablet for total 60 mg dose once daily 12/28/21   [provider]  insulin glargine (LANTUS) 100 UNIT/ML Solostar Pen Inject 40 Units into the skin at bedtime.    [provider]  isosorbide mononitrate (IMDUR) 30 MG 24 hr tablet Take 1 tablet (30 mg total) by mouth daily. 01/02/22   Wouk, Wilfred Curtis, MD  JARDIANCE 25 MG TABS tablet Take 25 mg by mouth daily. 12/28/21   [provider]  metFORMIN (GLUCOPHAGE) 500 MG tablet Take 500 mg by mouth 2 (two) times daily. 12/28/21   [provider]  Multiple Vitamins-Minerals (MULTIVITAMIN ADULTS 50+) TABS Take 1 tablet by mouth daily.    [provider]  Omega-3 1000 MG CAPS Take 1,000 mg by mouth every 3 (three) days.    [provider]  Probiotic Product (PROBIOTIC-10) CHEW Chew 1 capsule by mouth every other day.    [provider]  sacubitril-valsartan (ENTRESTO) 49-51 MG Take 1 tablet by mouth in the morning and at bedtime.    [provider]  Semaglutide, 1 MG/DOSE, (OZEMPIC, 1 MG/DOSE,) 4 MG/3ML SOPN Inject 1 mg into the skin once a week. Sunday 11/09/21   [provider]   Resolved hospital problems:   Mechanical intubation  Septic shock  Acute toxic metabolic encephalopathy   Assessment & Plan:   #Acute on chronic CHF #Elevated troponin likely secondary to demand ischemia vs. NSTEMI   Hx: Aortic valve replacement TAVR, coronary bypass surgery x3, and ischemic cardiomyopathy, and HTN   Echo 07/24/23: EF 20 to 25%; left ventricle has severely decreased function; mild mitral valve regurgitation  -  Continuous telemetry monitoring  - Troponin's peaked at 2,648 - Cardiology consulted appreciate input - Will resume GDMT once vital signs remain stable  - Diurese as bp and renal function permits  - Continue heparin gtt: dosing per pharmacy   #Acute hypoxic respiratory failure~improving   #COVID-19 infection #CAP #Bilateral pleural effusions - Supplemental O2 for dyspnea and/or hypoxia  - Maintain O2 sats 92% or higher  - IV steroids wean as tolerated  - Prn bronchodilator therapy  - Intermittent CXR and ABG's    #Acute kidney  injury secondary to ATN~resolved   #Non anion gap metabolic acidosis  #Lactic acidosis~resolved  - Trend BMP  - Replace electrolytes as indicated - Strict I&O's - Avoid nephrotoxic agents as able   #COVID-19 infection  #CAP  - Trend WBC and monitor fever curve  - Follow cultures  - Continue abx therapy as outlined above pending culture results and sensitivities   #Type II diabetes mellitus  - CBG's ac/hs - SSI and scheduled lantus  - Follow hyper/hypoglycemic protocol  - Target CBG 140 to 180    Best practice:  Diet: Carb Modified  Pain/Anxiety/Delirium protocol (if indicated): N/A VAP protocol (if indicated) DVT prophylaxis: Heparin gtt  GI prophylaxis: N/A Glucose control:  SSI Yes Central venous access:  Yes, and it is still needed Arterial line:  N/A Foley:  Orders placed to discontinue  Mobility:  Activity as tolerated  PT consulted: Yes  Last date of multidisciplinary goals of care discussion (07/26/2023) Code Status:  full code Disposition: ICU  03/11: Updated pt and pts family regarding pts condition and current plan of care   Critical care time: 35 minutes      Zada Girt, AGNP  Pulmonary/Critical Care Pager [phone] (please enter 7 digits) PCCM Consult Pager [phone] (please enter 7 digits)

## 2023-07-26 NOTE — Consult Note (Addendum)
 PHARMACY CONSULT NOTE - ELECTROLYTES  Pharmacy Consult for Electrolyte Monitoring and Replacement   Recent Labs: Height: 6' (182.9 cm) Weight: 98 kg (216 lb 0.8 oz) IBW/kg (Calculated) : 77.6 Estimated Creatinine Clearance: 62.5 mL/min (by C-G formula based on SCr of 1.22 mg/dL). Potassium (mmol/L)  Date Value  07/26/2023 4.4   Magnesium (mg/dL)  Date Value  40/98/1191 2.5 (H)   Calcium (mg/dL)  Date Value  47/82/9562 8.0 (L)   Albumin (g/dL)  Date Value  13/12/6576 4.0   Phosphorus (mg/dL)  Date Value  46/96/2952 2.7   Sodium (mmol/L)  Date Value  07/26/2023 138    Assessment  Adam Ferguson is a 78 y.o. male presenting with acute respiratory failure. PMH significant for hypertension, diabetes, CHF, CAD status post bypass, TAVR . Pharmacy has been consulted to monitor and replace electrolytes.  Goal of Therapy: Electrolytes WNL (Target > 4 K and > 2 Mg due to history of CHF/CAD)  Plan:  No replacement required at this time Continue to follow electrolytes with AM labs  Because this consult was generated as part of an ICU order set and patient is transferring pharmacy will sign off for now Please feel free to reach out if any further assistance is needed  Thank you for allowing pharmacy to be a part of this patient's care.  Effie Shy, PharmD Pharmacy Resident  07/26/2023 6:54 AM

## 2023-07-27 ENCOUNTER — Inpatient Hospital Stay

## 2023-07-27 DIAGNOSIS — I5023 Acute on chronic systolic (congestive) heart failure: Secondary | ICD-10-CM | POA: Diagnosis not present

## 2023-07-27 DIAGNOSIS — J9622 Acute and chronic respiratory failure with hypercapnia: Secondary | ICD-10-CM

## 2023-07-27 DIAGNOSIS — J9621 Acute and chronic respiratory failure with hypoxia: Secondary | ICD-10-CM

## 2023-07-27 DIAGNOSIS — I639 Cerebral infarction, unspecified: Secondary | ICD-10-CM

## 2023-07-27 DIAGNOSIS — A419 Sepsis, unspecified organism: Secondary | ICD-10-CM

## 2023-07-27 LAB — URINALYSIS, COMPLETE (UACMP) WITH MICROSCOPIC
Bilirubin Urine: NEGATIVE
Glucose, UA: >=500 mg/dL — AB
Ketones, ur: NEGATIVE mg/dL
Leukocytes,Ua: NEGATIVE
Nitrite: NEGATIVE
Protein, ur: 100 mg/dL — AB
RBC / HPF: >50 RBC/hpf (ref 0–5)
Specific Gravity, Urine: 1.025 (ref 1.005–1.030)
Squamous Epithelial / HPF: 0 /HPF (ref 0–5)
pH: 6 (ref 5.0–8.0)

## 2023-07-27 LAB — GLUCOSE, CAPILLARY
Glucose-Capillary: 149 mg/dL — ABNORMAL HIGH (ref 70–99)
Glucose-Capillary: 154 mg/dL — ABNORMAL HIGH (ref 70–99)
Glucose-Capillary: 205 mg/dL — ABNORMAL HIGH (ref 70–99)
Glucose-Capillary: 173 mg/dL — ABNORMAL HIGH (ref 70–99)

## 2023-07-27 LAB — BASIC METABOLIC PANEL
Anion gap: 12 (ref 5–15)
BUN: 37 mg/dL — ABNORMAL HIGH (ref 8–23)
CO2: 23 mmol/L (ref 22–32)
Calcium: 7.9 mg/dL — ABNORMAL LOW (ref 8.9–10.3)
Chloride: 105 mmol/L (ref 98–111)
Creatinine, Ser: 1.3 mg/dL — ABNORMAL HIGH (ref 0.61–1.24)
GFR, Estimated: 57 mL/min — ABNORMAL LOW (ref >60)
Glucose, Bld: 166 mg/dL — ABNORMAL HIGH (ref 70–99)
Potassium: 4.2 mmol/L (ref 3.5–5.1)
Sodium: 140 mmol/L (ref 135–145)

## 2023-07-27 LAB — CBC
HCT: 47.6 % (ref 39.0–52.0)
Hemoglobin: 16.1 g/dL (ref 13.0–17.0)
MCH: 32.4 pg (ref 26.0–34.0)
MCHC: 33.8 g/dL (ref 30.0–36.0)
MCV: 95.8 fL (ref 80.0–100.0)
Platelets: 159 10*3/uL (ref 150–400)
RBC: 4.97 MIL/uL (ref 4.22–5.81)
RDW: 13.8 % (ref 11.5–15.5)
WBC: 10 10*3/uL (ref 4.0–10.5)
nRBC: 0 % (ref 0.0–0.2)

## 2023-07-27 LAB — LIPID PANEL
Cholesterol: 180 mg/dL (ref 0–200)
HDL: 45 mg/dL (ref >40)
LDL Cholesterol: 114 mg/dL — ABNORMAL HIGH (ref 0–99)
Total CHOL/HDL Ratio: 4 ratio
Triglycerides: 104 mg/dL (ref <150)
VLDL: 21 mg/dL (ref 0–40)

## 2023-07-27 LAB — PHOSPHORUS: Phosphorus: 2.5 mg/dL (ref 2.5–4.6)

## 2023-07-27 LAB — MAGNESIUM: Magnesium: 2.6 mg/dL — ABNORMAL HIGH (ref 1.7–2.4)

## 2023-07-27 MED ORDER — FUROSEMIDE 40 MG PO TABS
40.0000 mg | ORAL_TABLET | Freq: Every day | ORAL | Status: DC
  Administered 2023-07-27 – 2023-08-01 (×6): 40 mg via ORAL
  Filled 2023-07-27 (×2): qty 2
  Filled 2023-07-27 (×4): qty 1

## 2023-07-27 MED ORDER — ORAL CARE MOUTH RINSE: 15.0000 mL | OROMUCOSAL | Status: DC | PRN

## 2023-07-27 MED ORDER — ISOSORBIDE MONONITRATE ER 30 MG PO TB24
30.0000 mg | ORAL_TABLET | Freq: Every day | ORAL | Status: DC
  Administered 2023-07-27 – 2023-08-01 (×6): 30 mg via ORAL
  Filled 2023-07-27 (×6): qty 1

## 2023-07-27 MED ORDER — ORAL CARE MOUTH RINSE
15.0000 mL | OROMUCOSAL | Status: DC
  Administered 2023-07-27 – 2023-08-01 (×19): 15 mL via OROMUCOSAL

## 2023-07-27 MED ORDER — LOSARTAN POTASSIUM 25 MG PO TABS
12.5000 mg | ORAL_TABLET | Freq: Every day | ORAL | Status: DC
  Administered 2023-07-27 – 2023-07-29 (×3): 12.5 mg via ORAL
  Filled 2023-07-27: qty 0.5
  Filled 2023-07-27 (×2): qty 1

## 2023-07-27 NOTE — Plan of Care (Signed)
  Problem: Education: Goal: Knowledge of risk factors and measures for prevention of condition will improve Outcome: Progressing   Problem: Coping: Goal: Psychosocial and spiritual needs will be supported Outcome: Progressing   Problem: Respiratory: Goal: Will maintain a patent airway Outcome: Progressing Goal: Complications related to the disease process, condition or treatment will be avoided or minimized Outcome: Progressing   Problem: Clinical Measurements: Goal: Diagnostic test results will improve Outcome: Progressing   Problem: Education: Goal: Knowledge of General Education information will improve Description: Including pain rating scale, medication(s)/side effects and non-pharmacologic comfort measures Outcome: Progressing   Problem: Clinical Measurements: Goal: Diagnostic test results will improve Outcome: Progressing Goal: Respiratory complications will improve Outcome: Progressing   Problem: Coping: Goal: Level of anxiety will decrease Outcome: Progressing   Problem: Elimination: Goal: Will not experience complications related to bowel motility Outcome: Progressing

## 2023-07-27 NOTE — Assessment & Plan Note (Addendum)
 Class I with a BMI of 32.50

## 2023-07-27 NOTE — Assessment & Plan Note (Addendum)
 Present on admission with COVID-19 infection, pneumonia, elevated lactic acid, acute respiratory failure.  Patient had tachypnea, tachycardia and fever.  Currently on Rocephin (should finish tonight) and vancomycin (completed).  MRSA PCR positive.

## 2023-07-27 NOTE — Plan of Care (Signed)
 Problem: Education: Goal: Knowledge of risk factors and measures for prevention of condition will improve Outcome: Progressing   Problem: Coping: Goal: Psychosocial and spiritual needs will be supported Outcome: Progressing   Problem: Respiratory: Goal: Will maintain a patent airway Outcome: Progressing Goal: Complications related to the disease process, condition or treatment will be avoided or minimized Outcome: Progressing   Problem: Fluid Volume: Goal: Hemodynamic stability will improve Outcome: Progressing   Problem: Clinical Measurements: Goal: Diagnostic test results will improve Outcome: Progressing Goal: Signs and symptoms of infection will decrease Outcome: Progressing   Problem: Respiratory: Goal: Ability to maintain adequate ventilation will improve Outcome: Progressing   Problem: Education: Goal: Knowledge of General Education information will improve Description: Including pain rating scale, medication(s)/side effects and non-pharmacologic comfort measures Outcome: Progressing   Problem: Health Behavior/Discharge Planning: Goal: Ability to manage health-related needs will improve Outcome: Progressing   Problem: Clinical Measurements: Goal: Ability to maintain clinical measurements within normal limits will improve Outcome: Progressing Goal: Will remain free from infection Outcome: Progressing Goal: Diagnostic test results will improve Outcome: Progressing Goal: Respiratory complications will improve Outcome: Progressing Goal: Cardiovascular complication will be avoided Outcome: Progressing   Problem: Activity: Goal: Risk for activity intolerance will decrease Outcome: Progressing   Problem: Nutrition: Goal: Adequate nutrition will be maintained Outcome: Progressing   Problem: Coping: Goal: Level of anxiety will decrease Outcome: Progressing   Problem: Elimination: Goal: Will not experience complications related to bowel motility Outcome:  Progressing Goal: Will not experience complications related to urinary retention Outcome: Progressing   Problem: Pain Managment: Goal: General experience of comfort will improve and/or be controlled Outcome: Progressing   Problem: Safety: Goal: Ability to remain free from injury will improve Outcome: Progressing   Problem: Skin Integrity: Goal: Risk for impaired skin integrity will decrease Outcome: Progressing   Problem: Education: Goal: Ability to describe self-care measures that may prevent or decrease complications (Diabetes Survival Skills Education) will improve Outcome: Progressing Goal: Individualized Educational Video(s) Outcome: Progressing   Problem: Coping: Goal: Ability to adjust to condition or change in health will improve Outcome: Progressing   Problem: Fluid Volume: Goal: Ability to maintain a balanced intake and output will improve Outcome: Progressing   Problem: Health Behavior/Discharge Planning: Goal: Ability to identify and utilize available resources and services will improve Outcome: Progressing Goal: Ability to manage health-related needs will improve Outcome: Progressing   Problem: Metabolic: Goal: Ability to maintain appropriate glucose levels will improve Outcome: Progressing   Problem: Nutritional: Goal: Maintenance of adequate nutrition will improve Outcome: Progressing Goal: Progress toward achieving an optimal weight will improve Outcome: Progressing   Problem: Skin Integrity: Goal: Risk for impaired skin integrity will decrease Outcome: Progressing   Problem: Tissue Perfusion: Goal: Adequacy of tissue perfusion will improve Outcome: Progressing   Problem: Education: Goal: Ability to demonstrate management of disease process will improve Outcome: Progressing Goal: Ability to verbalize understanding of medication therapies will improve Outcome: Progressing Goal: Individualized Educational Video(s) Outcome: Progressing    Problem: Activity: Goal: Capacity to carry out activities will improve Outcome: Progressing   Problem: Cardiac: Goal: Ability to achieve and maintain adequate cardiopulmonary perfusion will improve Outcome: Progressing   Problem: Education: Goal: Knowledge of disease or condition will improve Outcome: Progressing Goal: Knowledge of secondary prevention will improve (MUST DOCUMENT ALL) Outcome: Progressing Goal: Knowledge of patient specific risk factors will improve (DELETE if not current risk factor) Outcome: Progressing   Problem: Ischemic Stroke/TIA Tissue Perfusion: Goal: Complications of ischemic stroke/TIA will be minimized Outcome: Progressing  Problem: Coping: Goal: Will verbalize positive feelings about self Outcome: Progressing Goal: Will identify appropriate support needs Outcome: Progressing

## 2023-07-27 NOTE — Progress Notes (Signed)
 Chart review follow up note  Please see the stroke consultation note from Dr. Iver Nestle from yesterday.  Following up on the pending workup: - CT angiography head and neck: No ELVO.  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  Approximately 50% stenosis of the left common carotid.  Multifocal consolidation within both lung apices/right worse than left. - MRI brain confirms a small PICA territory infarct on the left.  Punctate acute ischemia in the right parietal white matter and right cerebellum. - 2D echo: LVEF 20 to 25% no regional wall motion abnormalities, LA size normal.  Mild calcification of the mitral valve leaflets mild mitral regurgitation, no mitral stenosis.  Aortic valve is normal in structure.  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  Etiology suspicious for a cardioembolic source but no evidence of atrial fibrillation or LV thrombus.  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   Recommendations: Aspirin 81+ Plavix 75-he has been on this at home.  If has recurrence of strokes, consider anticoagulation, even in the absence of atrial fibrillation at that time-I am reluctant to start him on anticoagulation just about yet given absence of strong evidence for anticoagulation in patients with heart failure without atrial fibrillation. 30 day cardiac monitor - if shows Afib - then will surely need DOAC High intensity statin for goal LDL less than 70 Goal A1c less than 7 Follow-up with outpatient neurology in 8 to 12 weeks.  Plan relayed to Dr. Cay Schillings, MD Neurology

## 2023-07-27 NOTE — Hospital Course (Addendum)
 78 year old Caucasian male with a past medical history that is significant for CAD s/p coronary artery bypass graft x 3, type 2 diabetes, congestive heart failure, aortic stenosis s/p TAVR hypertension, and hyperlipidemia who presented to the ED via EMS with complaints of acute dyspnea and hypoxemia.  Patient is currently intubated and sedated hence history is obtained from ED and EMS documentation.  Per EMS documentation, patient's wife called EMS because patient was having difficulty breathing.  Upon EMS arrival, patient's SpO2 was 44% on room air as he was placed on CPAP and transferred to the ED.  Of note, patient was exposed to COVID-19.  Patient lives with his wife who has dementia. ED Course: Upon arrival in the ED, patient's SpO2 was 44% on CPAP.  He was emergently intubated.  Postintubation, patient was difficult to oxygenate requiring paralytics.  ED workup showed bilateral pneumonia, hypokalemia, hyperglycemia, elevated BNP suggestive of acute CHF exacerbation and bilateral pleural effusions.  Postintubation, patient developed septic shock requiring pressors. Initial vital signs showed HR of  73 beats/minute, BP 102/91 mmHg, the RR 37 breaths/minute, and the oxygen saturation 47% on CPAP and a temperature of 97.64F (36.6C). Patient was tripoding, speaking in one-word sentences and anxious.  He was emergently intubated upon arrival in the ED.  Patient was extremely difficult intubation.  07/24/2023: ED with acute hypoxic respiratory failure due to COVID-19 infection 07/25/2023: Pt remains mechanically intubated and able to follow commands during WUA.  Performed SBT pt initially had adequate tidal volumes, however he later became lethargic and apneic prompting backup rate.  Once mentation improves will perform SBT again  3/11.  Code stroke called in the evening.  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 3/12.  Case discussed with neurology and okay to continue aspirin Plavix and Lipitor.  PT and OT evaluations.  Still on heated high flow nasal cannula 55% oxygen. 3/13.  PT recommending rehab.  Patient down to bubble high flow nasal cannula 4 L this afternoon. 3/14.  On regular nasal cannula about 4.5 L this morning.  Patient feeling okay. 3/15.  Down to 2 L this morning.  Walked 160 feet with mobility specialist yesterday afternoon. 316.  Patient off oxygen.  Patient interested in going home tomorrow.

## 2023-07-27 NOTE — Progress Notes (Signed)
 Pt has been weaned off BiPAP to HHFNC, not requiring vasopressors, hemodynamically stable.  No current Critical Care needs.    PCCM will sign off at this time.  Please re-consult should any critical care needs arise or if we can be of further assistance.      Harlon Ditty, AGACNP-BC [facility] Prefer epic messenger for cross cover needs If after hours, please call E-link

## 2023-07-27 NOTE — Progress Notes (Signed)
 Per Dr. Wilford Corner okay for pt to take aspirin and plavix.

## 2023-07-27 NOTE — Assessment & Plan Note (Signed)
 Could be embolic in nature.  Will need a 30-day monitor.  Neurology okay with aspirin and Plavix and Lipitor at this point.

## 2023-07-27 NOTE — Progress Notes (Signed)
 Progress Note   Patient: Adam Ferguson UEA:[id number] DOB: Jul 08, 1945 DOA: 07/24/2023     3 DOS: the patient was seen and examined on 07/27/2023   Brief hospital course: 78 year old Caucasian male with a past medical history that is significant for CAD s/p coronary artery bypass graft x 3, type 2 diabetes, congestive heart failure, aortic stenosis s/p TAVR hypertension, and hyperlipidemia who presented to the ED via EMS with complaints of acute dyspnea and hypoxemia.  Patient is currently intubated and sedated hence history is obtained from ED and EMS documentation.  Per EMS documentation, patient's wife called EMS because patient was having difficulty breathing.  Upon EMS arrival, patient's SpO2 was 44% on room air as he was placed on CPAP and transferred to the ED.  Of note, patient was exposed to COVID-19.  Patient lives with his wife who has dementia. ED Course: Upon arrival in the ED, patient's SpO2 was 44% on CPAP.  He was emergently intubated.  Postintubation, patient was difficult to oxygenate requiring paralytics.  ED workup showed bilateral pneumonia, hypokalemia, hyperglycemia, elevated BNP suggestive of acute CHF exacerbation and bilateral pleural effusions.  Postintubation, patient developed septic shock requiring pressors. Initial vital signs showed HR of  73 beats/minute, BP 102/91 mmHg, the RR 37 breaths/minute, and the oxygen saturation 47% on CPAP and a temperature of 97.19F (36.6C). Patient was tripoding, speaking in one-word sentences and anxious.  He was emergently intubated upon arrival in the ED.  Patient was extremely difficult intubation.  07/24/2023: ED with acute hypoxic respiratory failure due to COVID-19 infection 07/25/2023: Pt remains mechanically intubated and able to follow commands during WUA.  Performed SBT pt initially had adequate tidal volumes, however he later became lethargic and apneic prompting backup rate.  Once mentation improves will perform SBT again  3/11.  Code  stroke called in the evening.  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 3/12.  Case discussed with neurology and okay to continue aspirin Plavix and Lipitor.  PT and OT evaluations.  Still on heated high flow nasal cannula 55% oxygen.  Assessment and Plan: * Acute on chronic respiratory failure with hypoxia and hypercapnia (HCC) Patient extubated on 3/11.  Patient on heated high flow nasal cannula 58% oxygen when I saw him this morning.  Acute CVA (cerebrovascular accident) (HCC) Could be embolic in nature.  Will need a 30-day monitor.  Neurology okay with aspirin and Plavix and Lipitor at this point.  Septic shock (HCC) Present on admission with COVID-19 infection, pneumonia, elevated lactic acid, acute respiratory failure.  Patient had tachypnea, tachycardia and fever.  Currently on Rocephin and vancomycin.  MRSA PCR positive.  COVID-19 Supportive care  Acute on chronic systolic CHF (congestive heart failure) (HCC) EF 20 to 25%.  Patient on low-dose Coreg oral Lasix.  Will add low-dose ARB this evening.  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  Cannot tell me what happened yesterday.  Feels okay.  Some cough.  Breathing okay.  Physical Exam: Vitals:   07/27/23 1000 07/27/23 1030 07/27/23 1100 07/27/23 1130  BP: 119/68 114/73 123/77 (!) 138/94  Pulse: 68 65 67 76  Resp: 17 17 18 19   Temp:    Marland Kitchen)  97.5 F (36.4 C)  TempSrc:    Oral  SpO2: 97% 98% 99% 99%  Weight:      Height:       Physical Exam HENT:     Head: Normocephalic.     Mouth/Throat:     Pharynx: No oropharyngeal exudate.  Eyes:     General: Lids are  normal.     Conjunctiva/sclera: Conjunctivae normal.  Cardiovascular:     Rate and Rhythm: Normal rate and regular rhythm.     Heart sounds: Normal heart sounds, S1 normal and S2 normal.  Pulmonary:     Breath sounds: Examination of the right-lower field reveals decreased breath sounds and rhonchi. Examination of the left-lower field reveals decreased breath sounds and rhonchi. Decreased breath sounds and rhonchi present. No wheezing or rales.  Abdominal:     Palpations: Abdomen is soft.     Tenderness: There is no abdominal tenderness.  Musculoskeletal:     Right lower leg: No swelling.     Left lower leg: No swelling.  Skin:    General: Skin is warm.     Findings: No rash.  Neurological:     Mental Status: He is alert and oriented to person, place, and time.     Comments: Power 5 out of 5 upper and lower extremities.  Patient able to straight leg raise.     Data Reviewed: Creatinine 1.3, LDL 114, white blood cell count 10.0, hemoglobin 16.1, platelet count 159  Family Communication: Spoke with stepson on the phone  Disposition: Status is: Inpatient Remains inpatient appropriate because: Patient on heated high flow nasal cannula.  Planned Discharge Destination: Rehab    Time spent: 28 minutes  Author: Alford Highland, MD 07/27/2023 12:49 PM  For on call review [email or web address].

## 2023-07-27 NOTE — Progress Notes (Signed)
 Occupational Therapy Re-evaluation Patient Details Name: Nichola Warren MRN: [id number] DOB: 1945/12/29 Today's Date: 07/27/2023   History of present illness Pt is a 78 year old [facility] with acute hypoxic respiratory failure requiring intubation mechanical ventilation on 03/09 and admission to ICU; Work up includes bilateral pneumonia, hypokalemia, hyperglycemia, elevated BNP suggestive of acute CHF exacerbation and bilateral pleural effusions.  Postintubation, patient developed septic shock requiring pressors; Pt is Covid +. 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  Marland Kitchen PMH significant for CAD status post CABG x 3, type 2 diabetes, congestive heart failure, severe aortic stenosis status post TAVR, hypertension and hyperlipidemia   OT comments  Chart reviewed to date, re-evaluation completed following code stroke, goals remain appropriate. Son/DIL in room throughout. Pt continues to perform below PLOF, will benefit from acute OT to address deficits. Bed mobility completed with CGA +2, STS with CGA+2, amb in room with RW approx 10' with CGA+2, intermittent vcs for technique, SET UP for grooming/feeding tasks, anticipate MAX A for LB dressing. Pt on HHFNC 40L 38% FIO2 throughout with Spo2 >95% with mobility. Semi supine in bed BP 129/71 82 bpm, sitting on edge of bed 125/80 71 bpm. Mild dizziness with position changes but resolved with time. Discussed with pt/family discharge recommendations. Pt is left in bedside chair, all needs met, safety maintained. OT will continue to follow.       If plan is discharge home, recommend the following:  A little help with walking and/or  transfers;A little help with bathing/dressing/bathroom;Assistance with cooking/housework;Help with stairs or ramp for entrance;Assist for transportation   Equipment Recommendations  BSC/3in1;Tub/shower seat;Other (comment) (2WW)    Recommendations for Other Services      Precautions / Restrictions Precautions Precautions: Fall Recall of Precautions/Restrictions: Intact       Mobility Bed Mobility Overal bed mobility: Needs Assistance Bed Mobility: Supine to Sit     Supine to sit: Contact guard, HOB elevated, Used rails, +2 for safety/equipment     General bed mobility comments: intermittent vcs for technique    Transfers Overall transfer level: Needs assistance Equipment used: Rolling walker (2 wheels) Transfers: Sit to/from Stand Sit to Stand: Contact guard assist, +2 safety/equipment           General transfer comment: intermittent vcs for technique with RW     Balance Overall balance assessment: Needs assistance Sitting-balance support: Feet supported Sitting balance-Leahy Scale: Good     Standing balance support: Bilateral upper extremity supported, During functional activity, Reliant on assistive device for balance Standing balance-Leahy Scale: Fair                             ADL either performed or assessed with clinical judgement   ADL Overall ADL's : Needs assistance/impaired Eating/Feeding: Set up;Sitting   Grooming: Wash/dry face;Sitting;Set up                 Lower Body Dressing Details (indicate cue type and reason): MAX A to donn socks (anticipate), CGA to adjust socks Toilet Transfer: Contact guard assist;Rolling walker (2 wheels);Ambulation (+2 for lines/leads) Toilet Transfer Details (indicate cue type and reason): to bedside chair, simulated  Functional mobility during ADLs: Contact guard assist;+2 for physical assistance;Rolling walker (2 wheels) (approx 10' in room with +2 for lines/leads)       Extremity/Trunk Assessment Upper Extremity Assessment Upper Extremity Assessment: Right hand dominant;LUE deficits/detail LUE Deficits / Details: A/PROM appear WFL, strength grossly 4+/5 throughout, ?weakness in L hand, mild FMC/dexterity deficits however pt is able to functionally utilize L hand, will continue to assess LUE Sensation: WNL LUE Coordination: decreased fine motor   Lower Extremity Assessment Lower Extremity Assessment: Defer to PT evaluation RLE Deficits / Details: 5/5 RLE Sensation: WNL RLE Coordination: WNL LLE Deficits / Details: with initial testing questionable TKE, but pt able to correct and strong within range. ankle DF with initial testing also appeared weak but with repetition no deficit noted LLE Sensation: WNL LLE Coordination: WNL        Vision Baseline Vision/History: 1 Wears glasses Patient Visual Report: No change from baseline     Perception     Praxis     Communication Communication Communication: No apparent difficulties   Cognition Arousal: Alert Behavior During Therapy: WFL for tasks assessed/performed Cognition: No apparent impairments                               Following commands: Intact        Cueing   Cueing Techniques: Verbal cues  Exercises Other Exercises Other Exercises: edu pt/family re: role of OT, role of rehab, discussed discharge recommendations, DME for safe ADL completion    Shoulder Instructions       General Comments      Pertinent Vitals/ Pain       Pain Assessment Pain Assessment: No/denies pain  Home Living Family/patient expects to be discharged to:: Private residence Living Arrangements: Spouse/significant other Available Help at Discharge: Family (son lives out of town) Type of Home: House Home Access: Stairs to enter Secretary/administrator of Steps: 3 Entrance Stairs-Rails: Left;Right Home Layout: Two level;Able to live on main level with bedroom/bathroom Alternate Level  Stairs-Number of Steps: sleeps in the bedroom downstairs- full bathroom downstairs but he uses bathroom upstairs Alternate Level Stairs-Rails: Right Bathroom Shower/Tub: Walk-in shower;Tub/shower unit   Teacher, early years/pre: Yes   Home Equipment: None   Additional Comments: wife has  RW      Prior Functioning/Environment              Frequency  Min 2X/week        Progress Toward Goals  OT Goals(current goals can now be found in the care plan section)  Progress towards OT goals: Progressing toward goals  Acute Rehab OT Goals Time For Goal Achievement: 08/09/23  Plan      Co-evaluation      Reason for Co-Treatment: To address functional/ADL transfers PT goals addressed during session: Mobility/safety with mobility;Proper use of DME;Balance OT goals addressed during session: ADL's and self-care      AM-PAC OT "6 Clicks" Daily Activity     Outcome Measure   Help from another person eating meals?: None Help from another person taking care of personal grooming?: None Help from another person toileting, which includes using toliet, bedpan, or urinal?: A Lot Help from another person bathing (including washing, rinsing, drying)?: A Lot Help from another person to put on and taking off regular upper body clothing?: A Little Help from another person to put on and taking off regular lower body clothing?: A Lot 6 Click Score:  17    End of Session Equipment Utilized During Treatment: Gait belt;Rolling walker (2 wheels);Oxygen  OT Visit Diagnosis: Other abnormalities of gait and mobility (R26.89);Unsteadiness on feet (R26.81)   Activity Tolerance Patient tolerated treatment well   Patient Left in chair;with call bell/phone within reach;with chair alarm set;with family/visitor present   Nurse Communication Mobility status        Time: 1339-1405 OT Time Calculation (min): 26 min  Charges: OT General Charges $OT Visit: 1 Visit OT  Evaluation $OT Eval High Complexity: 1 High  Oleta Mouse, OTD OTR/L  07/27/23, 4:16 PM

## 2023-07-27 NOTE — Assessment & Plan Note (Addendum)
 Supportive care.  Completed Decadron

## 2023-07-27 NOTE — Assessment & Plan Note (Signed)
 Secondary to sepsis.

## 2023-07-27 NOTE — Evaluation (Signed)
 Physical Therapy Evaluation Patient Details Name: Adam Ferguson MRN: [id number] DOB: May 29, 1945 Today's Date: 07/27/2023  History of Present Illness  Pt is a 78 year old malepresented to [facility] on 03/09 with acute hypoxic respiratory failure requiring intubation mechanical ventilation on 03/09 and admission to ICU; Work up includes bilateral pneumonia, hypokalemia, hyperglycemia, elevated BNP suggestive of acute CHF exacerbation and bilateral pleural effusions.  Postintubation, patient developed septic shock requiring pressors; Pt is Covid +. Code stroke activated 3/11, imaging showed acute infarcts. PMH significant for CAD status post CABG x 3, type 2 diabetes, congestive heart failure, severe aortic stenosis status post TAVR, hypertension and hyperlipidemia.   Clinical Impression  Patient alert, agreeable to PT/OT, oriented x4. Denied pain. Per pt at baseline he takes care of his wife, is independent.Family in room during session confirmed they are visiting from out of town and can try to be available as needed.   He was able to transition to EOB with CGAx2, extra time. (2nd assist more for lines/leads). Upon assessment pt BLE strength WFLs, questionable initial deficits in TKE and ankle DF, but none noted with repetition and re-testing. He was able to sit <> stand and ambulate minimally in room with RW, CGAx2 (limited by HHFNC line). Pt up in chair with needs in reach at end of session. Pt is very motivated to return home and to PLOF.  Overall the patient demonstrated deficits (see "PT Problem List") that impede the patient's functional abilities, safety, and mobility and would benefit from skilled PT intervention.          If plan is discharge home, recommend the following: A little help with walking and/or transfers;A little help with bathing/dressing/bathroom;Assistance with cooking/housework;Assist for transportation;Help with stairs or ramp for entrance   Can travel by private vehicle    No    Equipment Recommendations Other (comment) (TBD)  Recommendations for Other Services       Functional Status Assessment Patient has had a recent decline in their functional status and demonstrates the ability to make significant improvements in function in a reasonable and predictable amount of time.     Precautions / Restrictions Precautions Precautions: Fall Recall of Precautions/Restrictions: Intact Restrictions Weight Bearing Restrictions Per Provider Order: No      Mobility  Bed Mobility Overal bed mobility: Needs Assistance Bed Mobility: Supine to Sit     Supine to sit: Contact guard, HOB elevated, Used rails, +2 for safety/equipment          Transfers Overall transfer level: Needs assistance Equipment used: Rolling walker (2 wheels) Transfers: Sit to/from Stand Sit to Stand: Contact guard assist, +2 safety/equipment                Ambulation/Gait Ambulation/Gait assistance: Contact guard assist Gait Distance (Feet): 10 Feet Assistive device: Rolling walker (2 wheels)         General Gait Details: able to step forwards/backwards twice, and left prior to sitting in recliner. limited by [facility]  Stairs            Wheelchair Mobility     Tilt Bed    Modified Rankin (Stroke Patients Only)       Balance Overall balance assessment: Needs assistance Sitting-balance support: Feet supported Sitting balance-Leahy Scale: Good     Standing balance support: Bilateral upper extremity supported, During functional activity, Reliant on assistive device for balance Standing balance-Leahy Scale: Fair  Pertinent Vitals/Pain Pain Assessment Pain Assessment: No/denies pain    Home Living Family/patient expects to be discharged to:: Private residence Living Arrangements: Spouse/significant other Available Help at Discharge: Family (son lives out of town) Type of Home: House Home Access: Stairs to  enter Entrance Stairs-Rails: Lawyer of Steps: 3 Alternate Level Stairs-Number of Steps: sleeps in the bedroom downstairs- full bathroom downstairs but he uses bathroom upstairs Home Layout: Two level;Able to live on main level with bedroom/bathroom Home Equipment: None Additional Comments: wife has  RW    Prior Function Prior Level of Function : Independent/Modified Independent;Driving                     Extremity/Trunk Assessment   Upper Extremity Assessment Upper Extremity Assessment: Defer to OT evaluation    Lower Extremity Assessment Lower Extremity Assessment: RLE deficits/detail;LLE deficits/detail RLE Deficits / Details: 5/5 RLE Sensation: WNL RLE Coordination: WNL LLE Deficits / Details: with initial testing questionable TKE, but pt able to correct and strong within range. ankle DF with initial testing also appeared weak but with repetition no deficit noted LLE Sensation: WNL LLE Coordination: WNL       Communication        Cognition Arousal: Alert Behavior During Therapy: WFL for tasks assessed/performed                                     Cueing Cueing Techniques: Verbal cues     General Comments      Exercises     Assessment/Plan    PT Assessment Patient needs continued PT services  PT Problem List Decreased strength;Decreased activity tolerance;Decreased balance;Decreased mobility;Decreased knowledge of precautions       PT Treatment Interventions DME instruction;Balance training;Gait training;Neuromuscular re-education;Stair training;Patient/family education;Functional mobility training;Therapeutic activities;Therapeutic exercise    PT Goals (Current goals can be found in the Care Plan section)  Acute Rehab PT Goals Patient Stated Goal: to go home PT Goal Formulation: With patient Time For Goal Achievement: 08/10/23 Potential to Achieve Goals: Good    Frequency Min 3X/week      Co-evaluation PT/OT/SLP Co-Evaluation/Treatment: Yes Reason for Co-Treatment: To address functional/ADL transfers PT goals addressed during session: Mobility/safety with mobility;Proper use of DME;Balance OT goals addressed during session: ADL's and self-care       AM-PAC PT "6 Clicks" Mobility  Outcome Measure Help needed turning from your back to your side while in a flat bed without using bedrails?: A Little Help needed moving from lying on your back to sitting on the side of a flat bed without using bedrails?: A Little Help needed moving to and from a bed to a chair (including a wheelchair)?: A Little Help needed standing up from a chair using your arms (e.g., wheelchair or bedside chair)?: A Little Help needed to walk in hospital room?: A Little Help needed climbing 3-5 steps with a railing? : A Lot 6 Click Score: 17    End of Session   Activity Tolerance: Patient tolerated treatment well Patient left: in chair;with call bell/phone within reach;with chair alarm set;with family/visitor present Nurse Communication: Mobility status PT Visit Diagnosis: Other abnormalities of gait and mobility (R26.89);Difficulty in walking, not elsewhere classified (R26.2);Muscle weakness (generalized) (M62.81)    Time: 1610-9604 PT Time Calculation (min) (ACUTE ONLY): 27 min   Charges:   PT Evaluation $PT Eval Moderate Complexity: 1 Mod PT Treatments $Therapeutic Activity: 8-22 mins PT  General Charges $$ ACUTE PT VISIT: 1 Visit        Olga Coaster PT, DPT 3:34 PM,07/27/23

## 2023-07-27 NOTE — Assessment & Plan Note (Addendum)
 Patient extubated on 3/11.  Patient on heated high flow nasal cannula 58% oxygen on 3/12.  Patient down to bubble high flow nasal cannula 4 L on 3/13.  Patient on regular nasal cannula 4.5 L on 3/14.  Patient down to 2 L nasal cannula on 3/15.

## 2023-07-27 NOTE — Assessment & Plan Note (Addendum)
 EF 20 to 25%.  Patient on low-dose Coreg, Aldactone, Jardiance, low-dose Entresto, Lasix.

## 2023-07-28 DIAGNOSIS — R6521 Severe sepsis with septic shock: Secondary | ICD-10-CM

## 2023-07-28 DIAGNOSIS — U071 COVID-19: Secondary | ICD-10-CM

## 2023-07-28 DIAGNOSIS — J9622 Acute and chronic respiratory failure with hypercapnia: Secondary | ICD-10-CM

## 2023-07-28 DIAGNOSIS — R319 Hematuria, unspecified: Secondary | ICD-10-CM | POA: Insufficient documentation

## 2023-07-28 DIAGNOSIS — A419 Sepsis, unspecified organism: Secondary | ICD-10-CM

## 2023-07-28 DIAGNOSIS — E874 Mixed disorder of acid-base balance: Secondary | ICD-10-CM

## 2023-07-28 DIAGNOSIS — I5023 Acute on chronic systolic (congestive) heart failure: Secondary | ICD-10-CM

## 2023-07-28 DIAGNOSIS — E669 Obesity, unspecified: Secondary | ICD-10-CM

## 2023-07-28 DIAGNOSIS — R531 Weakness: Secondary | ICD-10-CM

## 2023-07-28 DIAGNOSIS — J9621 Acute and chronic respiratory failure with hypoxia: Secondary | ICD-10-CM

## 2023-07-28 DIAGNOSIS — I639 Cerebral infarction, unspecified: Secondary | ICD-10-CM

## 2023-07-28 LAB — CBC
HCT: 42.9 % (ref 39.0–52.0)
Hemoglobin: 14.2 g/dL (ref 13.0–17.0)
MCH: 31.8 pg (ref 26.0–34.0)
MCHC: 33.1 g/dL (ref 30.0–36.0)
MCV: 96.2 fL (ref 80.0–100.0)
Platelets: 157 10*3/uL (ref 150–400)
RBC: 4.46 MIL/uL (ref 4.22–5.81)
RDW: 13.4 % (ref 11.5–15.5)
WBC: 8.4 10*3/uL (ref 4.0–10.5)
nRBC: 0 % (ref 0.0–0.2)

## 2023-07-28 LAB — BASIC METABOLIC PANEL
Anion gap: 9 (ref 5–15)
BUN: 34 mg/dL — ABNORMAL HIGH (ref 8–23)
CO2: 25 mmol/L (ref 22–32)
Calcium: 7.8 mg/dL — ABNORMAL LOW (ref 8.9–10.3)
Chloride: 103 mmol/L (ref 98–111)
Creatinine, Ser: 1.04 mg/dL (ref 0.61–1.24)
GFR, Estimated: >60 mL/min (ref >60)
Glucose, Bld: 179 mg/dL — ABNORMAL HIGH (ref 70–99)
Potassium: 3.9 mmol/L (ref 3.5–5.1)
Sodium: 137 mmol/L (ref 135–145)

## 2023-07-28 LAB — GLUCOSE, CAPILLARY
Glucose-Capillary: 152 mg/dL — ABNORMAL HIGH (ref 70–99)
Glucose-Capillary: 198 mg/dL — ABNORMAL HIGH (ref 70–99)
Glucose-Capillary: 90 mg/dL (ref 70–99)
Glucose-Capillary: 188 mg/dL — ABNORMAL HIGH (ref 70–99)

## 2023-07-28 MED ORDER — SPIRONOLACTONE 12.5 MG HALF TABLET
12.5000 mg | ORAL_TABLET | Freq: Every day | ORAL | Status: DC
  Administered 2023-07-29 – 2023-08-01 (×4): 12.5 mg via ORAL
  Filled 2023-07-28 (×4): qty 1

## 2023-07-28 MED ORDER — MUPIROCIN 2 % EX OINT
1.0000 | TOPICAL_OINTMENT | Freq: Two times a day (BID) | CUTANEOUS | Status: DC
  Administered 2023-07-28 – 2023-08-01 (×9): 1 via NASAL
  Filled 2023-07-28 (×2): qty 22

## 2023-07-28 NOTE — Assessment & Plan Note (Addendum)
 Physical therapy initially recommended rehab but patient walked 160 feet with mobility specialist yesterday afternoon.

## 2023-07-28 NOTE — Progress Notes (Signed)
 SLP Cancellation Note  Patient Details Name: Adam Ferguson MRN: [id number] DOB: 11/26/1945   Cancelled treatment:       Reason Eval/Treat Not Completed: SLP screened, no needs identified, will sign off (Chart review completed. Spoke with RN. Most recent NIHSS=0. No current speech/language/cognitive needs.)  Adam Ferguson, M.S., CCC-SLP Speech-Language Pathologist [facility] [phone] Adam Ferguson)  Adam Ferguson 07/28/2023, 1:53 PM

## 2023-07-28 NOTE — Assessment & Plan Note (Addendum)
 Foley catheter removed and is improved.

## 2023-07-28 NOTE — Progress Notes (Signed)
 Occupational Therapy Treatment Patient Details Name: Adam Ferguson MRN: [id number] DOB: [birth year] Today's Date: 07/28/2023   History of present illness Pt is a 78 year old malepresented to [facility] on 03/09 with acute hypoxic respiratory failure requiring intubation mechanical ventilation on 03/09 and admission to ICU; Work up includes bilateral pneumonia, hypokalemia, hyperglycemia, elevated BNP suggestive of acute CHF exacerbation and bilateral pleural effusions.  Postintubation, patient developed septic shock requiring pressors; Pt is Covid +. 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  Marland Kitchen PMH significant for CAD status post CABG x 3, type 2 diabetes, congestive heart failure, severe aortic stenosis status post TAVR, hypertension and hyperlipidemia   OT comments  Chart reviewed, pt greeted in room, agreeable to OT tx session. He is alert and oriented x4. Tx session targeted improving functional activity tolerance in the setting of ADL tasks. Improvements noted throughout with pt performing STS with CGA, amb in room with CGA approx 20' with RW, standing at sink with CGA with RW to perform grooming tasks. Pt tolerance continues to be limited as compared to baseline, will continue to benefit from acute OT to address deficits.       If plan is discharge home, recommend the following:  A little help with walking and/or transfers;A little help with bathing/dressing/bathroom;Assistance with cooking/housework;Help with stairs or ramp for entrance;Assist for transportation   Equipment Recommendations  BSC/3in1;Tub/shower seat;Other (comment) (2WW)    Recommendations for Other Services Rehab  consult    Precautions / Restrictions Precautions Precautions: Fall Recall of Precautions/Restrictions: Intact Restrictions Weight Bearing Restrictions Per Provider Order: No       Mobility Bed Mobility               General bed mobility comments: NT in recliner pre/post session    Transfers Overall transfer level: Needs assistance Equipment used: Rolling walker (2 wheels) Transfers: Sit to/from Stand Sit to Stand: Contact guard assist                 Balance Overall balance assessment: Needs assistance Sitting-balance support: Feet supported Sitting balance-Leahy Scale: Good     Standing balance support: Bilateral upper extremity supported, During functional activity, Reliant on assistive device for balance Standing balance-Leahy Scale: Fair                             ADL either performed or assessed with clinical judgement   ADL Overall ADL's : Needs assistance/impaired Eating/Feeding: Set up;Sitting   Grooming: Wash/dry face;Wash/dry hands;Oral care;Contact guard assist;Standing Grooming Details (indicate cue type and reason): with RW at sink level                 Toilet Transfer: Contact guard assist;Rolling walker (2 wheels);Ambulation Toilet Transfer Details (indicate cue type and reason): simulated         Functional mobility during ADLs: Contact guard assist;Rolling walker (2 wheels) (approx 20' in room with RW)      Extremity/Trunk Assessment Upper Extremity Assessment LUE Deficits / Details: uses L hand functionally during ADL/package/container management tasks            Vision       Perception     Praxis  Communication Communication Communication: No apparent difficulties   Cognition Arousal: Alert Behavior During Therapy: WFL for tasks assessed/performed Cognition: No apparent impairments                               Following commands: Intact        Cueing   Cueing Techniques:  Verbal cues  Exercises Other Exercises Other Exercises: edu re: role of OT, role of rehab, POC, importance of continued mobilization with staff help    Shoulder Instructions       General Comments spo2 >90% throughout on 4L via HFNC    Pertinent Vitals/ Pain       Pain Assessment Pain Assessment: No/denies pain  Home Living                                          Prior Functioning/Environment              Frequency  Min 3X/week        Progress Toward Goals  OT Goals(current goals can now be found in the care plan section)  Progress towards OT goals: Progressing toward goals  Acute Rehab OT Goals Time For Goal Achievement: 08/09/23  Plan      Co-evaluation                 AM-PAC OT "6 Clicks" Daily Activity     Outcome Measure   Help from another person eating meals?: None Help from another person taking care of personal grooming?: None Help from another person toileting, which includes using toliet, bedpan, or urinal?: A Lot Help from another person bathing (including washing, rinsing, drying)?: A Lot Help from another person to put on and taking off regular upper body clothing?: A Little Help from another person to put on and taking off regular lower body clothing?: A Lot 6 Click Score: 17    End of Session Equipment Utilized During Treatment: Gait belt;Rolling walker (2 wheels);Oxygen  OT Visit Diagnosis: Other abnormalities of gait and mobility (R26.89);Unsteadiness on feet (R26.81)   Activity Tolerance Patient tolerated treatment well   Patient Left in chair;with call bell/phone within reach;with chair alarm set   Nurse Communication Mobility status        Time: 1610-9604 OT Time Calculation (min): 20 min  Charges: OT General Charges $OT Visit: 1 Visit OT Treatments $Self Care/Home Management : 8-22 mins  Oleta Mouse, OTD OTR/L  07/28/23, 2:50 PM

## 2023-07-28 NOTE — Plan of Care (Signed)
 Problem: Education: Goal: Knowledge of risk factors and measures for prevention of condition will improve Outcome: Progressing   Problem: Coping: Goal: Psychosocial and spiritual needs will be supported Outcome: Progressing   Problem: Respiratory: Goal: Will maintain a patent airway Outcome: Progressing Goal: Complications related to the disease process, condition or treatment will be avoided or minimized Outcome: Progressing   Problem: Fluid Volume: Goal: Hemodynamic stability will improve Outcome: Progressing   Problem: Clinical Measurements: Goal: Diagnostic test results will improve Outcome: Progressing Goal: Signs and symptoms of infection will decrease Outcome: Progressing   Problem: Respiratory: Goal: Ability to maintain adequate ventilation will improve Outcome: Progressing   Problem: Education: Goal: Knowledge of General Education information will improve Description: Including pain rating scale, medication(s)/side effects and non-pharmacologic comfort measures Outcome: Progressing   Problem: Health Behavior/Discharge Planning: Goal: Ability to manage health-related needs will improve Outcome: Progressing   Problem: Clinical Measurements: Goal: Ability to maintain clinical measurements within normal limits will improve Outcome: Progressing Goal: Will remain free from infection Outcome: Progressing Goal: Diagnostic test results will improve Outcome: Progressing Goal: Respiratory complications will improve Outcome: Progressing Goal: Cardiovascular complication will be avoided Outcome: Progressing   Problem: Activity: Goal: Risk for activity intolerance will decrease Outcome: Progressing   Problem: Nutrition: Goal: Adequate nutrition will be maintained Outcome: Progressing   Problem: Coping: Goal: Level of anxiety will decrease Outcome: Progressing   Problem: Elimination: Goal: Will not experience complications related to bowel motility Outcome:  Progressing Goal: Will not experience complications related to urinary retention Outcome: Progressing   Problem: Pain Managment: Goal: General experience of comfort will improve and/or be controlled Outcome: Progressing   Problem: Safety: Goal: Ability to remain free from injury will improve Outcome: Progressing   Problem: Skin Integrity: Goal: Risk for impaired skin integrity will decrease Outcome: Progressing   Problem: Education: Goal: Ability to describe self-care measures that may prevent or decrease complications (Diabetes Survival Skills Education) will improve Outcome: Progressing Goal: Individualized Educational Video(s) Outcome: Progressing   Problem: Coping: Goal: Ability to adjust to condition or change in health will improve Outcome: Progressing   Problem: Fluid Volume: Goal: Ability to maintain a balanced intake and output will improve Outcome: Progressing   Problem: Health Behavior/Discharge Planning: Goal: Ability to identify and utilize available resources and services will improve Outcome: Progressing Goal: Ability to manage health-related needs will improve Outcome: Progressing   Problem: Metabolic: Goal: Ability to maintain appropriate glucose levels will improve Outcome: Progressing   Problem: Nutritional: Goal: Maintenance of adequate nutrition will improve Outcome: Progressing Goal: Progress toward achieving an optimal weight will improve Outcome: Progressing   Problem: Skin Integrity: Goal: Risk for impaired skin integrity will decrease Outcome: Progressing   Problem: Tissue Perfusion: Goal: Adequacy of tissue perfusion will improve Outcome: Progressing   Problem: Education: Goal: Ability to demonstrate management of disease process will improve Outcome: Progressing Goal: Ability to verbalize understanding of medication therapies will improve Outcome: Progressing Goal: Individualized Educational Video(s) Outcome: Progressing    Problem: Activity: Goal: Capacity to carry out activities will improve Outcome: Progressing   Problem: Cardiac: Goal: Ability to achieve and maintain adequate cardiopulmonary perfusion will improve Outcome: Progressing   Problem: Education: Goal: Knowledge of disease or condition will improve Outcome: Progressing Goal: Knowledge of secondary prevention will improve (MUST DOCUMENT ALL) Outcome: Progressing Goal: Knowledge of patient specific risk factors will improve (DELETE if not current risk factor) Outcome: Progressing   Problem: Ischemic Stroke/TIA Tissue Perfusion: Goal: Complications of ischemic stroke/TIA will be minimized Outcome: Progressing  Problem: Coping: Goal: Will verbalize positive feelings about self Outcome: Progressing Goal: Will identify appropriate support needs Outcome: Progressing

## 2023-07-28 NOTE — Progress Notes (Signed)
 Progress Note   Patient: Adam Ferguson MWN:[id number] DOB: 1945-09-03 DOA: 07/24/2023     4 DOS: the patient was seen and examined on 07/28/2023   Brief hospital course: 78 year old Caucasian male with a past medical history that is significant for CAD s/p coronary artery bypass graft x 3, type 2 diabetes, congestive heart failure, aortic stenosis s/p TAVR hypertension, and hyperlipidemia who presented to the ED via EMS with complaints of acute dyspnea and hypoxemia.  Patient is currently intubated and sedated hence history is obtained from ED and EMS documentation.  Per EMS documentation, patient's wife called EMS because patient was having difficulty breathing.  Upon EMS arrival, patient's SpO2 was 44% on room air as he was placed on CPAP and transferred to the ED.  Of note, patient was exposed to COVID-19.  Patient lives with his wife who has dementia. ED Course: Upon arrival in the ED, patient's SpO2 was 44% on CPAP.  He was emergently intubated.  Postintubation, patient was difficult to oxygenate requiring paralytics.  ED workup showed bilateral pneumonia, hypokalemia, hyperglycemia, elevated BNP suggestive of acute CHF exacerbation and bilateral pleural effusions.  Postintubation, patient developed septic shock requiring pressors. Initial vital signs showed HR of  73 beats/minute, BP 102/91 mmHg, the RR 37 breaths/minute, and the oxygen saturation 47% on CPAP and a temperature of 97.37F (36.6C). Patient was tripoding, speaking in one-word sentences and anxious.  He was emergently intubated upon arrival in the ED.  Patient was extremely difficult intubation.  07/24/2023: ED with acute hypoxic respiratory failure due to COVID-19 infection 07/25/2023: Pt remains mechanically intubated and able to follow commands during WUA.  Performed SBT pt initially had adequate tidal volumes, however he later became lethargic and apneic prompting backup rate.  Once mentation improves will perform SBT again  3/11.  Code  stroke called in the evening.  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 3/12.  Case discussed with neurology and okay to continue aspirin Plavix and Lipitor.  PT and OT evaluations.  Still on heated high flow nasal cannula 55% oxygen. 3/13.  PT recommending rehab.  Patient down to bubble high flow nasal cannula 4 L this afternoon.  Assessment and Plan: * Acute on chronic respiratory failure with hypoxia and hypercapnia (HCC) Patient extubated on 3/11.  Patient on heated high flow nasal cannula 58% oxygen on 3/12.  Patient down to bubble high flow nasal cannula 4 L on 3/13 in the afternoon.  Acute CVA (cerebrovascular accident) (HCC) Could be embolic in nature.  Will need a 30-day monitor.  Neurology okay with aspirin and Plavix and Lipitor at this point.  Septic shock (HCC) Present on admission with COVID-19 infection, pneumonia, elevated lactic acid, acute respiratory failure.  Patient had tachypnea, tachycardia and fever.  Currently on Rocephin and vancomycin.  MRSA PCR positive.  COVID-19 Supportive care.  Patient on Decadron daily.  Acute on chronic systolic CHF (congestive heart failure) (HCC) EF 20 to 25%.  Patient on low-dose Coreg, losartan and low-dose Lasix.  Will add low-dose Aldactone for tomorrow.  Respiratory acidosis with metabolic acidosis Secondary to sepsis  Generalized weakness Physical therapy recommending rehab  Hematuria Will discontinue Foley catheter.  Hemoglobin stable.  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  Offers no complaints.  He has not had a few words.  Admitted with acute respiratory failure pneumonia and COVID infection and CHF exacerbation.  Also found to  have acute strokes.  Physical Exam: Vitals:   07/28/23 1100 07/28/23 1120 07/28/23 1216 07/28/23 1232  BP: (!) 140/80 (!) 143/75    Pulse: 72 73    Resp: (!) 24 (!) 22    Temp:  98.2 F (36.8 C)    TempSrc:  Oral    SpO2: 98% 95% 96% 96%  Weight:      Height:       Physical Exam HENT:     Head: Normocephalic.     Mouth/Throat:     Pharynx: No oropharyngeal exudate.  Eyes:     General: Lids are normal.     Conjunctiva/sclera: Conjunctivae normal.  Cardiovascular:     Rate and Rhythm: Normal rate and regular rhythm.     Heart sounds: Normal heart sounds, S1 normal and S2 normal.  Pulmonary:     Breath sounds: Examination of the right-lower field reveals decreased breath sounds and rhonchi. Examination of the left-lower field reveals decreased breath sounds and rhonchi. Decreased breath sounds and rhonchi present. No wheezing or rales.  Abdominal:     Palpations: Abdomen is soft.     Tenderness: There is no abdominal tenderness.  Musculoskeletal:     Right lower leg: No swelling.     Left lower leg: No swelling.  Skin:    General: Skin is warm.     Findings: No rash.  Neurological:     Mental Status: He is alert and oriented to person, place, and time.     Comments: Power 5 out of 5 upper and lower extremities.  Patient able to straight leg raise.     Data Reviewed: Cr 1.04, wbc 8.4, hb 14.2, plt 157  Family Communication: Spoke with son in law on the phone  Disposition: Status is: Inpatient Remains inpatient appropriate because: Will transfer from stepdown to progressive care today.  PT recommending rehab since he has COVID would likely need the 10-day isolation period.  Planned Discharge Destination: Rehab    Time spent: 28 minutes  Author: Alford Highland, MD 07/28/2023 12:35 PM  For on call review [email or web address].

## 2023-07-28 NOTE — Progress Notes (Signed)
 Physical Therapy Treatment Patient Details Name: Adam Ferguson MRN: [id number] DOB: [birth year] Today's Date: 07/28/2023   History of Present Illness Pt is a 78 year old malepresented to [facility] on 03/09 with acute hypoxic respiratory failure requiring intubation mechanical ventilation on 03/09 and admission to ICU; Work up includes bilateral pneumonia, hypokalemia, hyperglycemia, elevated BNP suggestive of acute CHF exacerbation and bilateral pleural effusions.  Postintubation, patient developed septic shock requiring pressors; Pt is Covid +. 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  Marland Kitchen PMH significant for CAD status post CABG x 3, type 2 diabetes, congestive heart failure, severe aortic stenosis status post TAVR, hypertension and hyperlipidemia    PT Comments  Pt alert, oriented x4, denied pain. Pt still demonstrated a very light deficit in L knee TKE but does complete the ROM and strength is 4+/5. He was able to tolerate the session today that mostly focused on gait training. Higher level balance activities in hallway with and without RW. Did experience some unsteadiness and CGA needed without RW, agreed to continued use for now. Pt also able to use BSC with supervision, CGA for stand pivot with RW. The patient remains very motivated to return to independent PLOF.     If plan is discharge home, recommend the following: A little help with walking and/or transfers;A little help with bathing/dressing/bathroom;Assistance with cooking/housework;Assist for transportation;Help with stairs or ramp for entrance   Can travel by private vehicle     Yes  Equipment Recommendations  Other (comment)  (TBD, but if DC home, RW)    Recommendations for Other Services       Precautions / Restrictions Precautions Precautions: Fall Recall of Precautions/Restrictions: Intact Restrictions Weight Bearing Restrictions Per Provider Order: No     Mobility  Bed Mobility               General bed mobility comments: NT in recliner pre/post session    Transfers Overall transfer level: Needs assistance Equipment used: Rolling walker (2 wheels), None Transfers: Sit to/from Stand, Bed to chair/wheelchair/BSC Sit to Stand: Supervision, Contact guard assist           General transfer comment: CGA for transfers without RW, supervision for transfers with    Ambulation/Gait Ambulation/Gait assistance: Contact guard assist, Supervision Gait Distance (Feet): 270 Feet Assistive device: Rolling walker (2 wheels), None             Stairs             Wheelchair Mobility     Tilt Bed    Modified Rankin (Stroke Patients Only)       Balance Overall balance assessment: Needs assistance Sitting-balance support: Feet supported Sitting balance-Leahy Scale: Good     Standing balance support: Bilateral upper extremity supported, During functional activity, Reliant on assistive device for balance Standing balance-Leahy Scale: Good               High level balance activites: Direction changes, Turns, Sudden stops, Head turns              Communication Communication Communication: No apparent difficulties  Cognition Arousal: Alert Behavior During Therapy: WFL for tasks assessed/performed   PT - Cognitive impairments: No apparent impairments  Following commands: Intact      Cueing Cueing Techniques: Verbal cues  Exercises      General Comments General comments (skin integrity, edema, etc.): spo2 >90% throughout on 4L via HFNC      Pertinent Vitals/Pain Pain Assessment Pain Assessment: No/denies pain    Home  Living                          Prior Function            PT Goals (current goals can now be found in the care plan section) Progress towards PT goals: Progressing toward goals    Frequency    Min 3X/week      PT Plan      Co-evaluation              AM-PAC PT "6 Clicks" Mobility   Outcome Measure  Help needed turning from your back to your side while in a flat bed without using bedrails?: A Little Help needed moving from lying on your back to sitting on the side of a flat bed without using bedrails?: A Little Help needed moving to and from a bed to a chair (including a wheelchair)?: A Little Help needed standing up from a chair using your arms (e.g., wheelchair or bedside chair)?: A Little Help needed to walk in hospital room?: A Little Help needed climbing 3-5 steps with a railing? : A Little 6 Click Score: 18    End of Session   Activity Tolerance: Patient tolerated treatment well Patient left: in bed;with call bell/phone within reach;with bed alarm set Nurse Communication: Mobility status PT Visit Diagnosis: Other abnormalities of gait and mobility (R26.89);Difficulty in walking, not elsewhere classified (R26.2);Muscle weakness (generalized) (M62.81)     Time: 1610-9604 PT Time Calculation (min) (ACUTE ONLY): 31 min  Charges:    $Gait Training: 8-22 mins $Therapeutic Activity: 8-22 mins PT General Charges $$ ACUTE PT VISIT: 1 Visit                     Olga Coaster PT, DPT 2:33 PM,07/28/23

## 2023-07-28 NOTE — Progress Notes (Signed)
   Inpatient Rehab Admissions Coordinator :  Per therapy recommendations, patient was screened for CIR candidacy by Ottie Glazier RN MSN. Patient contact guard assist to supervision level. He is not in need of AIR level rehab at this time. Recommend other rehab venues to be pursued if unable to return home with family support.Please contact me with any questions.  Ottie Glazier RN MSN Admissions Coordinator [phone]

## 2023-07-29 ENCOUNTER — Other Ambulatory Visit (HOSPITAL_COMMUNITY): Payer: Self-pay

## 2023-07-29 LAB — CULTURE, BLOOD (ROUTINE X 2)
Culture: NO GROWTH
Culture: NO GROWTH

## 2023-07-29 LAB — GLUCOSE, CAPILLARY
Glucose-Capillary: 169 mg/dL — ABNORMAL HIGH (ref 70–99)
Glucose-Capillary: 150 mg/dL — ABNORMAL HIGH (ref 70–99)
Glucose-Capillary: 175 mg/dL — ABNORMAL HIGH (ref 70–99)
Glucose-Capillary: 220 mg/dL — ABNORMAL HIGH (ref 70–99)

## 2023-07-29 MED ORDER — EMPAGLIFLOZIN 25 MG PO TABS
25.0000 mg | ORAL_TABLET | Freq: Every day | ORAL | Status: DC
  Administered 2023-07-30 – 2023-08-01 (×3): 25 mg via ORAL
  Filled 2023-07-29 (×3): qty 1

## 2023-07-29 NOTE — Progress Notes (Signed)
 Heart Failure Stewardship Pharmacy Note  PCP: Mick Sell, MD PCP-Cardiologist: None  HPI: Adam Ferguson is a 78 y.o. male with CAD, HFrEF, AS s/p TAVR, and T2DM who presented with acute hypoxic respiratory failure requiring mechanical ventilation. On admission, BNP was 1512, HS-troponin was 50 > 427 > peak 2648, and lactic acid was 9 > 2.1 >2.4 > 1.2. Positive for COVID-19. Chest x-ray noted perihilar vascular congestion and mild generalized interstitial edema consistent with CHF. Developed septic shock from COVID and superimposed bacterial pneumonia. Code stroke called on 07/26/23, found to have acute infarct within the left PICA territory.   Pertinent cardiac history: Echo in 11/2016 noted LVEF 60% with grade I diastolic dysfunction. CABG in 08/2017. Echo 10/2017 showed LVEF reduced to 35% with severely dilated LA and grade III diastolic dysfunction. Catherterization 01/2018 noted distal LMT stenoses extending into the occluded ostial LAD and the severely stenosed ostial Lcx, LAD occluded proximally filled via patent LIMA to mid LAD, LCx filled via compromised antegrade flow from the LMT and retrograde proximal LAD flow which is also compromised by the proximal LAD stenosis, SVG occluded, RCA dominant with diffuse Ca+ and severe ostial and mid vessel segment stenoses, SVG to rPDA is occluded. PCI with DES to the ostial LMT into proximal Lcx. Underwent TAVR in 04/2018.  Echo in 04/2018 showed LVEF 45%. Most recent echo this admission noted LVEF 20-25% with mildly reduced RV function.  Pertinent Lab Values: Creatinine, Ser  Date Value Ref Range Status  07/28/2023 1.04 0.61 - 1.24 mg/dL Final   BUN  Date Value Ref Range Status  07/28/2023 34 (H) 8 - 23 mg/dL Final   Potassium  Date Value Ref Range Status  07/28/2023 3.9 3.5 - 5.1 mmol/L Final   Sodium  Date Value Ref Range Status  07/28/2023 137 135 - 145 mmol/L Final   B Natriuretic Peptide  Date Value Ref Range Status   07/24/2023 1,512.0 (H) 0.0 - 100.0 pg/mL Final    Comment:    Performed at [facility], [address] [place], [place] [address]   Magnesium  Date Value Ref Range Status  07/27/2023 2.6 (H) 1.7 - 2.4 mg/dL Final    Comment:    Performed at [facility], [address] [place], [place] [address]   Hgb A1c MFr Bld  Date Value Ref Range Status  07/24/2023 7.3 (H) 4.8 - 5.6 % Final    Comment:    (NOTE) Pre diabetes:          5.7%-6.4%  Diabetes:              >6.4%  Glycemic control for   <7.0% adults with diabetes     Vital Signs: Temp:  [98 F (36.7 C)-98.3 F (36.8 C)] 98.2 F (36.8 C) (03/14 0425) Pulse Rate:  [64-74] 66 (03/14 0425) Cardiac Rhythm: Ventricular tachycardia (03/14 0302) Resp:  [18-25] 19 (03/14 0425) BP: (119-145)/(64-81) 145/78 (03/14 0425) SpO2:  [94 %-98 %] 95 % (03/14 0425) FiO2 (%):  [36 %] 36 % (03/13 1233) Weight:  [107.8 kg (237 lb 10.5 oz)] 107.8 kg (237 lb 10.5 oz) (03/14 0500)  Intake/Output Summary (Last 24 hours) at 07/29/2023 0757 Last data filed at 07/28/2023 2030 Gross per 24 hour  Intake --  Output 1060 ml  Net -1060 ml   Current Heart Failure Medications:  Loop diuretic: furosemide 40 mg PO daily Beta-Blocker: carvedilol 3.125 mg BID ACEI/ARB/ARNI: losartan 12.5 mg QHS MRA: spironolactone 12.5 mg daily SGLT2i: none  Prior to admission Heart Failure Medications:  Loop diuretic: furosemide 60 mg daily Beta-Blocker: carvedilol 3.125 mg BID ACEI/ARB/ARNI: Entresto 49-51 mg BID MRA: none SGLT2i: Jardiance 25 mg daily  Assessment: 1. 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 NYHA class III symptoms.  -Symptoms: NYHA II-III symptoms, though currently requiring oxygen. Reports appetite is fair and denies dyspnea with mild exertion. -Volume: Appears relatively euvolemic. Creatinine yesterday was normal. There is inconsistency in weights. Charted urine  output is decent.  -Hemodynamics: BP is slightly elevated. Heart rate is 60s. -BB: Continue carvedilol 3.125 mg BID. -ACEI/ARB/ARNI: Currently on losartan 12.5 mg daily after hypotension in the ICU. Will attempt to increased to 25 mg daily, then transition back to [place]. -MRA: Continue spironolactone 12.5 mg daily.  -SGLT2i: Consider restarting home Jardiance 25 mg daily  Plan: 1) Medication changes recommended at this time: -Consider restarting home Jardiance 25 mg daily. -Agree with new start spironolactone this morning. If labs appear to be stable tomorrow, can consider increasing losartan to 25 mg or restarting Entresto at 24-26 mg daily depending upon BP.   2) Patient assistance: -Patient reports retiring relatively recently and no longer has prescription insurance. He reports his Medicare part D will start in April. He reports he has enough of his home medications (ie Jardiance + Entresto) to last until then.  3) Education: - Patient has been educated on current HF medications and potential additions to HF medication regimen - Patient verbalizes understanding that over the next few months, these medication doses may change and more medications may be added to optimize HF regimen - Patient has been educated on basic disease state pathophysiology and goals of therapy  Medication Assistance / Insurance Benefits Check: Does the patient have prescription insurance?    Type of insurance plan:  Does the patient qualify for medication assistance through manufacturers or grants? Pending  Eligible grants and/or patient assistance programs: pending  Medication assistance applications in progress: none  Medication assistance applications approved: none Approved medication assistance renewals will be completed by: [facility]  Outpatient Pharmacy: Prior to admission outpatient pharmacy: [facility]      Please do not hesitate to reach out with questions or concerns,  Enos Fling, PharmD,  CPP, BCPS Heart Failure Pharmacist  Phone - [phone] 07/29/2023 11:52 AM

## 2023-07-29 NOTE — Care Management Important Message (Signed)
 Important Message  Patient Details  Name: Adam Ferguson MRN: [id number] Date of Birth: 02/13/1946   Important Message Given:  Yes - Medicare IM     Cristela Blue, CMA 07/29/2023, 11:00 AM

## 2023-07-29 NOTE — Progress Notes (Signed)
 Mobility Specialist - Progress Note  Post-mobility: HR 77, SPO2 95%   07/29/23 1424  Mobility  Activity Ambulated with assistance in hallway  Level of Assistance Standby assist, set-up cues, supervision of patient - no hands on  Assistive Device Front wheel walker  Distance Ambulated (ft) 160 ft  Activity Response Tolerated well  Mobility visit 1 Mobility  Mobility Specialist Start Time (ACUTE ONLY) 1100  Mobility Specialist Stop Time (ACUTE ONLY) 1113  Mobility Specialist Time Calculation (min) (ACUTE ONLY) 13 min   Pt sitting in the recliner upon entry, utilizing 4L. Pt agreeable to amb within the hallway this date, denies pain. Pt STS to RW and amb one lap around the NS with supervision-- steady gait. Pt returned to the room, left seated in the recliner with alarm set and needs within reach.  Zetta Bills Mobility Specialist 07/29/23 2:29 PM

## 2023-07-29 NOTE — Progress Notes (Signed)
 Occupational Therapy Treatment Patient Details Name: Adam Ferguson MRN: [id number] DOB: Sep 25, 1945 Today's Date: 07/29/2023   History of present illness Pt is a 78 year old malepresented to [facility] on 03/09 with acute hypoxic respiratory failure requiring intubation mechanical ventilation on 03/09 and admission to ICU; Work up includes bilateral pneumonia, hypokalemia, hyperglycemia, elevated BNP suggestive of acute CHF exacerbation and bilateral pleural effusions.  Postintubation, patient developed septic shock requiring pressors; Pt is Covid +. 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  Marland Kitchen PMH significant for CAD status post CABG x 3, type 2 diabetes, congestive heart failure, severe aortic stenosis status post TAVR, hypertension and hyperlipidemia   OT comments  Chart reviewed to date, pt greeted in chair, alert and oriented x4, agreeable to OT tx session targeting improving functional activity tolerance for improved ADL performance. Discussed and educated pt regarding safe ADL completion with/without DME use with all pt questions answered within scope. Pt is making progress towards goals, OT will continue to follow to facilitate improved ADL performance, return to PLOF.      If plan is discharge home, recommend the following:  A little help with walking and/or transfers;A little help with bathing/dressing/bathroom;Assistance with cooking/housework;Help with stairs or ramp for entrance;Assist for transportation   Equipment Recommendations  BSC/3in1;Tub/shower seat;Other (comment) (2WW)    Recommendations for Other Services      Precautions / Restrictions Precautions Precautions:  Fall Recall of Precautions/Restrictions: Intact Restrictions Weight Bearing Restrictions Per Provider Order: No       Mobility Bed Mobility               General bed mobility comments: NT in recliner pre/post session    Transfers Overall transfer level: Needs assistance Equipment used: Rolling walker (2 wheels) Transfers: Sit to/from Stand Sit to Stand: Supervision                 Balance Overall balance assessment: Needs assistance Sitting-balance support: Feet supported Sitting balance-Leahy Scale: Good     Standing balance support: No upper extremity supported, During functional activity Standing balance-Leahy Scale: Fair                             ADL either performed or assessed with clinical judgement   ADL Overall ADL's : Needs assistance/impaired Eating/Feeding: Set up;Sitting                   Lower Body Dressing: Sit to/from stand;Contact guard assist;Sitting/lateral leans Lower Body Dressing Details (indicate cue type and reason): socks, simulated donning/doffing pants Toilet Transfer: Supervision/safety;Contact guard assist;Rolling walker (2 wheels);Ambulation Toilet Transfer Details (indicate cue type and reason): simulated         Functional mobility during ADLs: Rolling walker (2 wheels);Contact guard assist (without AD approx 10' in room with CGA) General ADL Comments: pt is limited by activity tolerance/ endurance on this date    Extremity/Trunk Assessment              Vision       Perception     Praxis     Communication Communication Communication: No apparent difficulties   Cognition Arousal: Alert Behavior During Therapy: WFL for tasks assessed/performed Cognition: No  apparent impairments                               Following commands: Intact        Cueing   Cueing Techniques: Verbal cues  Exercises Other Exercises Other Exercises: edu re: role of OT, safe ADL completion with  DME/energy conservation techniques    Shoulder Instructions       General Comments Pt on 4.5L via [place], spo2 >90% throughout    Pertinent Vitals/ Pain       Pain Assessment Pain Assessment: No/denies pain  Home Living                                          Prior Functioning/Environment              Frequency  Min 3X/week        Progress Toward Goals  OT Goals(current goals can now be found in the care plan section)  Progress towards OT goals: Progressing toward goals  Acute Rehab OT Goals Time For Goal Achievement: 08/09/23  Plan      Co-evaluation                 AM-PAC OT "6 Clicks" Daily Activity     Outcome Measure   Help from another person eating meals?: None Help from another person taking care of personal grooming?: None Help from another person toileting, which includes using toliet, bedpan, or urinal?: A Little Help from another person bathing (including washing, rinsing, drying)?: A Little Help from another person to put on and taking off regular upper body clothing?: None Help from another person to put on and taking off regular lower body clothing?: A Little 6 Click Score: 21    End of Session Equipment Utilized During Treatment: Gait belt;Rolling walker (2 wheels);Oxygen  OT Visit Diagnosis: Other abnormalities of gait and mobility (R26.89);Unsteadiness on feet (R26.81)   Activity Tolerance Patient tolerated treatment well   Patient Left in chair;with call bell/phone within reach   Nurse Communication Mobility status        Time: 4098-1191 OT Time Calculation (min): 21 min  Charges: OT General Charges $OT Visit: 1 Visit OT Treatments $Self Care/Home Management : 8-22 mins  Oleta Mouse, OTD OTR/L  07/29/23, 12:19 PM

## 2023-07-29 NOTE — Progress Notes (Signed)
 Pharmacy Antibiotic Note  Adam Ferguson is a 78 y.o. male admitted on 07/24/2023 with sepsis from unknown source with a a past medical history that is significant for CAD s/p coronary artery bypass graft x 3, type 2 diabetes, congestive heart failure, aortic stenosis s/p TAVR hypertension, and hyperlipidemia. Patient was intubated due to acute respiratory failure in the setting of COVID, pulmonary edema and possible superimposed bacterial infection. Pharmacy has been consulted for Cefepime and Vancomycin dosing for 7 days.  Today, 07/29/2023 Scr 1.04 (above baseline ~1.2 from 11/24) WBC 15.7 >>>8.4 Afebrile Imaging 3/9 CT Chest: Patchy perihilar predominant opacities and diffuse interstitial prominence, similar in comparison prior and likely reflecting a combination of infection and edema. 3/9 CT Head: No acute intracranial abnormality or acute traumatic injury identified. 3/9 CT neck: No other acute or inflammatory process identified in the noncontrast Neck.  Plan: Day 6 of 7, patient clincally improving, renal function stable. Continue Vancomycin 1500 mg Q24H until therapy completed Vancomycin and ceftriaxone with ends date in epic  Temp (24hrs), Avg:98.3 F (36.8 C), Min:98 F (36.7 C), Max:98.9 F (37.2 C)   Recent Labs  Lab 07/24/23 0417 07/24/23 0747 07/24/23 1407 07/24/23 1951 07/24/23 2316 07/25/23 0624 07/26/23 0414 07/26/23 1552 07/26/23 1903 07/27/23 0554 07/28/23 0414  WBC 9.4  --   --   --   --  15.7* 11.0*  --   --  10.0 8.4  CREATININE 1.65*  --   --   --  1.55* 1.59* 1.22  --   --  1.30* 1.04  LATICACIDVEN  --    < > 1.2 1.1 1.1  --   --  5.5* 2.3*  --   --    < > = values in this interval not displayed.    Estimated Creatinine Clearance: 76.7 mL/min (by C-G formula based on SCr of 1.04 mg/dL).    No Known Allergies  Antimicrobials this admission: 3/09 Flagyl x 1 3/09 Cefepime >> x 7 days 3/9 Zithromax 500 Q24H >> 3/11 3/09 Vancomycin >>  3/11  Ceftriaxone >>  Microbiology results: 3/09 BCx: NGTD 3/9 Respiratory: GPC 3/9 MRSA nares: positive   Adam Ferguson PharmD, BCPS 07/29/2023 1:56 PM

## 2023-07-29 NOTE — Progress Notes (Signed)
 Progress Note   Patient: Adam Ferguson ZOX:[id number] DOB: 11/18/1945 DOA: 07/24/2023     5 DOS: the patient was seen and examined on 07/29/2023   Brief hospital course: 78 year old Caucasian male with a past medical history that is significant for CAD s/p coronary artery bypass graft x 3, type 2 diabetes, congestive heart failure, aortic stenosis s/p TAVR hypertension, and hyperlipidemia who presented to the ED via EMS with complaints of acute dyspnea and hypoxemia.  Patient is currently intubated and sedated hence history is obtained from ED and EMS documentation.  Per EMS documentation, patient's wife called EMS because patient was having difficulty breathing.  Upon EMS arrival, patient's SpO2 was 44% on room air as he was placed on CPAP and transferred to the ED.  Of note, patient was exposed to COVID-19.  Patient lives with his wife who has dementia. ED Course: Upon arrival in the ED, patient's SpO2 was 44% on CPAP.  He was emergently intubated.  Postintubation, patient was difficult to oxygenate requiring paralytics.  ED workup showed bilateral pneumonia, hypokalemia, hyperglycemia, elevated BNP suggestive of acute CHF exacerbation and bilateral pleural effusions.  Postintubation, patient developed septic shock requiring pressors. Initial vital signs showed HR of  73 beats/minute, BP 102/91 mmHg, the RR 37 breaths/minute, and the oxygen saturation 47% on CPAP and a temperature of 97.35F (36.6C). Patient was tripoding, speaking in one-word sentences and anxious.  He was emergently intubated upon arrival in the ED.  Patient was extremely difficult intubation.  07/24/2023: ED with acute hypoxic respiratory failure due to COVID-19 infection 07/25/2023: Pt remains mechanically intubated and able to follow commands during WUA.  Performed SBT pt initially had adequate tidal volumes, however he later became lethargic and apneic prompting backup rate.  Once mentation improves will perform SBT again  3/11.  Code  stroke called in the evening.  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 3/12.  Case discussed with neurology and okay to continue aspirin Plavix and Lipitor.  PT and OT evaluations.  Still on heated high flow nasal cannula 55% oxygen. 3/13.  PT recommending rehab.  Patient down to bubble high flow nasal cannula 4 L this afternoon. 3/14.  On regular nasal cannula about 4.5 L this morning.  Patient feeling okay.  Assessment and Plan: * Acute on chronic respiratory failure with hypoxia and hypercapnia (HCC) Patient extubated on 3/11.  Patient on heated high flow nasal cannula 58% oxygen on 3/12.  Patient down to bubble high flow nasal cannula 4 L on 3/13.  Patient on regular nasal cannula 4.5 L on 3/14.  Acute CVA (cerebrovascular accident) (HCC) Could be embolic in nature.  Will need a 30-day monitor.  Neurology okay with aspirin and Plavix and Lipitor at this point.  Septic shock (HCC) Present on admission with COVID-19 infection, pneumonia, elevated lactic acid, acute respiratory failure.  Patient had tachypnea, tachycardia and fever.  Currently on Rocephin and vancomycin.  MRSA PCR positive.  COVID-19 Supportive care.  Patient on Decadron daily.  Acute on chronic systolic CHF (congestive heart failure) (HCC) EF 20 to 25%.  Patient on low-dose Coreg, losartan and low-dose Lasix.  Started Aldactone this morning.  Restart Jardiance tomorrow.  Respiratory acidosis with metabolic acidosis Secondary to sepsis  Generalized weakness Physical therapy recommending rehab  Hematuria Foley catheter removed  Obesity (BMI 30-39.9) Class I with a BMI of 32.23        Subjective: Patient feeling okay.  Offers no complaints.  Still has a little bit of a  cough.  Physical Exam: Vitals:   07/29/23 0425 07/29/23 0500 07/29/23 0946 07/29/23 1219  BP: (!) 145/78  128/72 139/74  Pulse: 66  68 69  Resp: 19     Temp: 98.2 F (36.8 C)  98.9 F (37.2 C) 98.4 F (36.9 C)  TempSrc:      SpO2: 95%  96% 98%  Weight:  107.8 kg    Height:       Physical Exam HENT:     Head: Normocephalic.     Mouth/Throat:     Pharynx: No oropharyngeal exudate.  Eyes:     General: Lids are normal.     Conjunctiva/sclera: Conjunctivae normal.  Cardiovascular:     Rate and Rhythm: Normal rate and regular rhythm.     Heart sounds: Normal heart sounds, S1 normal and S2 normal.  Pulmonary:     Breath sounds: Examination of the right-lower field reveals decreased breath sounds. Examination of the left-lower field reveals decreased breath sounds. Decreased breath sounds present. No wheezing, rhonchi or rales.  Abdominal:     Palpations: Abdomen is soft.     Tenderness: There is no abdominal tenderness.  Musculoskeletal:     Right lower leg: No swelling.     Left lower leg: No swelling.  Skin:    General: Skin is warm.     Findings: No rash.  Neurological:     Mental Status: He is alert.     Data Reviewed: Creatinine 1.04, white blood cell count 8.4, hemoglobin 14.2, platelet count 157  Family Communication: Updated Matthew on the phone  Disposition: Status is: Inpatient Remains inpatient appropriate because: Will need rehab may end up needing a 10-day isolation.  Planned Discharge Destination: Rehab    Time spent: 28 minutes  Author: Alford Highland, MD 07/29/2023 1:26 PM  For on call review [email or web address].

## 2023-07-29 NOTE — Progress Notes (Signed)
 Nutrition Follow-up  DOCUMENTATION CODES:   Obesity unspecified  INTERVENTION:   -Liberalize diet to carb modified for wider variety of meal selections -Continue Glucerna Shake po BID, each supplement provides 220 kcal and 10 grams of protein  -Continue Ensure Max po daily, each supplement provides 150 kcal and 30 grams of protein  -Continue MVI with minerals daily  -Magic cup TID with meals, each supplement provides 290 kcal and 9 grams of protein   NUTRITION DIAGNOSIS:   Inadequate oral intake related to inability to eat as evidenced by NPO status.  Progressing; advanced to PO diet on 07/25/23  GOAL:   Patient will meet greater than or equal to 90% of their needs  Progressing   MONITOR:   PO intake, Supplement acceptance  REASON FOR ASSESSMENT:   Consult, Ventilator Enteral/tube feeding initiation and management  ASSESSMENT:   78 y/o male with h/o aortic stenosis s/p TAVR (2019), CHF, CVA, CAD s/p CABG x 3, DM, MI, MDD, HTN and HLD who is admitted with COVID 19 complicated by hospital fall and new CVA.  3/10- extubated, advanced to full liquid diet 3/11- advanced to carb modified diet 3/12- weaned from Bi-pap to [facility] neurology work up reveals Acute ischemic infarctions-largest in the left cerebellum with punctate infarcts in the right cerebellum and right parietal white matter. Etiology suspicious for a cardioembolic source but no evidence of atrial fibrillation or LV thrombus.   Reviewed I/O's: -1.1 L x 24 hours and -5 L since admission  UOP: 1.1 L x 24 hours   Spoke with pt at bedside, who was eating breakfast at time of visit. Pt pleased with progress and reports feeling better today. Per pt, his appetite is improving and is consuming about 50% of meals. He denies any difficulty chewing or swallowing.   Per pt, he has been drinking some of his supplements. Discussed importance of good meal and supplement intake to promote healing. Pt amenable to nutrition  treatment plan.   No wt loss noted since admission.   Medications reviewed and include decadron, lovenox, lasix, and ladactone.   Labs reviewed: CBGS: 90-173 (inpatient orders for glycemic control are 0-5 units insulin aspart daily at bedtime, 0-9 units insulin aspart TID with meals, and 10 units insulin glargine BID).    Diet Order:   Diet Order             Diet Heart Fluid consistency: Thin  Diet effective now                   EDUCATION NEEDS:   Not appropriate for education at this time  Skin:  Skin Assessment: Reviewed RN Assessment  Last BM:  07/28/23 (type 6)  Height:   Ht Readings from Last 1 Encounters:  07/24/23 6' (1.829 m)    Weight:   Wt Readings from Last 1 Encounters:  07/29/23 107.8 kg    Ideal Body Weight:  80.9 kg  BMI:  Body mass index is 32.23 kg/m.  Estimated Nutritional Needs:   Kcal:  2200-2400  Protein:  120-135 grams  Fluid:  2-2.2 L    Levada Schilling, RD, LDN, CDCES Registered Dietitian III Certified Diabetes Care and Education Specialist If unable to reach this RD, please use "RD Inpatient" group chat on secure chat between hours of 8am-4 pm daily

## 2023-07-30 DIAGNOSIS — Z515 Encounter for palliative care: Secondary | ICD-10-CM

## 2023-07-30 DIAGNOSIS — J9621 Acute and chronic respiratory failure with hypoxia: Secondary | ICD-10-CM | POA: Diagnosis not present

## 2023-07-30 DIAGNOSIS — I509 Heart failure, unspecified: Secondary | ICD-10-CM

## 2023-07-30 DIAGNOSIS — U071 COVID-19: Secondary | ICD-10-CM | POA: Diagnosis not present

## 2023-07-30 LAB — GLUCOSE, CAPILLARY
Glucose-Capillary: 161 mg/dL — ABNORMAL HIGH (ref 70–99)
Glucose-Capillary: 149 mg/dL — ABNORMAL HIGH (ref 70–99)
Glucose-Capillary: 159 mg/dL — ABNORMAL HIGH (ref 70–99)
Glucose-Capillary: 147 mg/dL — ABNORMAL HIGH (ref 70–99)
Glucose-Capillary: 195 mg/dL — ABNORMAL HIGH (ref 70–99)

## 2023-07-30 LAB — BASIC METABOLIC PANEL
Anion gap: 9 (ref 5–15)
BUN: 32 mg/dL — ABNORMAL HIGH (ref 8–23)
CO2: 23 mmol/L (ref 22–32)
Calcium: 8 mg/dL — ABNORMAL LOW (ref 8.9–10.3)
Chloride: 102 mmol/L (ref 98–111)
Creatinine, Ser: 0.85 mg/dL (ref 0.61–1.24)
GFR, Estimated: >60 mL/min (ref >60)
Glucose, Bld: 187 mg/dL — ABNORMAL HIGH (ref 70–99)
Potassium: 3.6 mmol/L (ref 3.5–5.1)
Sodium: 134 mmol/L — ABNORMAL LOW (ref 135–145)

## 2023-07-30 MED ORDER — LOPERAMIDE HCL 2 MG PO CAPS
2.0000 mg | ORAL_CAPSULE | Freq: Three times a day (TID) | ORAL | Status: DC | PRN
  Administered 2023-07-30: 2 mg via ORAL
  Filled 2023-07-30: qty 1

## 2023-07-30 MED ORDER — LOSARTAN POTASSIUM 25 MG PO TABS
25.0000 mg | ORAL_TABLET | Freq: Every day | ORAL | Status: DC
Start: 2023-07-30 — End: 2023-07-30

## 2023-07-30 MED ORDER — SACUBITRIL-VALSARTAN 24-26 MG PO TABS
1.0000 | ORAL_TABLET | Freq: Two times a day (BID) | ORAL | Status: DC
  Administered 2023-07-30 – 2023-08-01 (×4): 1 via ORAL
  Filled 2023-07-30 (×4): qty 1

## 2023-07-30 NOTE — Progress Notes (Signed)
 Physical Therapy Treatment Patient Details Name: Adam Ferguson MRN: [id number] DOB: [birth year] Today's Date: 07/30/2023   History of Present Illness Pt is a 78 year old [facility] with acute hypoxic respiratory failure requiring intubation mechanical ventilation on 03/09 and admission to ICU; Work up includes bilateral pneumonia, hypokalemia, hyperglycemia, elevated BNP suggestive of acute CHF exacerbation and bilateral pleural effusions.  Postintubation, patient developed septic shock requiring pressors; Pt is Covid +. 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  Marland Kitchen PMH significant for CAD status post CABG x 3, type 2 diabetes, congestive heart failure, severe aortic stenosis status post TAVR, hypertension and hyperlipidemia    PT Comments  Pt received sitting up in bedside recline, states he has been ambulating to/from bathroom using support of furniture independently today. Now on RA with SpO2 at 97% at rest. Pt completed gait training with RW, primarily for security while ambulating out in hallway with Supervision, no LOB or significant SOB, SpO2 91%, HR slightly elevated to 104bpm due to low endurance after hospital stay. Overall, pt has made excellent progress since admission and feels comfortable d/c'ing home once medically cleared. Depending on functional progression, pt may benefit from support of a Rollator upon d/c as initially recommended.    If plan is discharge home, recommend the following: A little help with walking and/or transfers;A little help with bathing/dressing/bathroom;Assistance with cooking/housework;Assist for transportation;Help with stairs or ramp  for entrance   Can travel by private vehicle     Yes  Equipment Recommendations  Other (comment) (TBD, if d/c home may need a Rollator depending on functional improvement)    Recommendations for Other Services       Precautions / Restrictions Precautions Precautions: Fall Recall of Precautions/Restrictions: Intact Restrictions Weight Bearing Restrictions Per Provider Order: No     Mobility  Bed Mobility               General bed mobility comments: NT in recliner pre/post session    Transfers Overall transfer level: Needs assistance Equipment used: Rolling walker (2 wheels), None Transfers: Sit to/from Stand Sit to Stand: Supervision           General transfer comment: Pt states he has been ambulating to/from bathroom independently in room via "furniture walking"    Ambulation/Gait Ambulation/Gait assistance: Supervision Gait Distance (Feet): 160 Feet Assistive device: Rolling walker (2 wheels), None Gait Pattern/deviations: Step-through pattern, Wide base of support Gait velocity: decr     General Gait Details: Pt ambulated on RA with SpO2 remaining at 91%, HR slightly elevated due to low endurance level   Stairs             Wheelchair Mobility     Tilt Bed    Modified Rankin (Stroke Patients Only)       Balance Overall balance assessment: Needs assistance Sitting-balance support: Feet supported Sitting balance-Leahy Scale: Good     Standing balance support: No upper extremity supported, During functional activity, Reliant on assistive device for balance Standing balance-Leahy Scale: Fair Standing balance comment: Pt ambulating in room holding onto furniture. Feels more secure using RW in hallway for longer distances  Communication Communication Communication: No apparent difficulties  Cognition Arousal: Alert Behavior During Therapy: WFL for tasks assessed/performed   PT - Cognitive  impairments: No apparent impairments                       PT - Cognition Comments: Pleasant and cooperative Following commands: Intact      Cueing Cueing Techniques: Verbal cues  Exercises Other Exercises Other Exercises: Pt educated on role of PT, current home set up, and pt's comfort level with d/c'ing home once medically stable.    General Comments General comments (skin integrity, edema, etc.): 97% on RA at rest, 91% upon exertion      Pertinent Vitals/Pain Pain Assessment Pain Assessment: No/denies pain    Home Living                          Prior Function            PT Goals (current goals can now be found in the care plan section) Acute Rehab PT Goals Patient Stated Goal: to go home Progress towards PT goals: Progressing toward goals    Frequency    Min 3X/week      PT Plan      Co-evaluation              AM-PAC PT "6 Clicks" Mobility   Outcome Measure  Help needed turning from your back to your side while in a flat bed without using bedrails?: A Little Help needed moving from lying on your back to sitting on the side of a flat bed without using bedrails?: A Little Help needed moving to and from a bed to a chair (including a wheelchair)?: A Little Help needed standing up from a chair using your arms (e.g., wheelchair or bedside chair)?: A Little Help needed to walk in hospital room?: A Little Help needed climbing 3-5 steps with a railing? : A Little 6 Click Score: 18    End of Session   Activity Tolerance: Patient tolerated treatment well Patient left: in chair;with call bell/phone within reach Nurse Communication: Mobility status PT Visit Diagnosis: Other abnormalities of gait and mobility (R26.89);Difficulty in walking, not elsewhere classified (R26.2);Muscle weakness (generalized) (M62.81)     Time: 1610-9604 PT Time Calculation (min) (ACUTE ONLY): 20 min  Charges:    $Gait Training: 8-22 mins PT General  Charges $$ ACUTE PT VISIT: 1 Visit                    Zadie Cleverly, PTA  Jannet Askew 07/30/2023, 5:39 PM

## 2023-07-30 NOTE — Progress Notes (Addendum)
 Attempted to visit to introduce spiritual care and provide information on HCPOA paperwork. Staff entering room for pt care-will return to follow up

## 2023-07-30 NOTE — Consult Note (Signed)
 Consultation Note Date: 07/30/2023   Patient Name: Adam Ferguson  DOB: 04/09/1946  MRN: [id number]  Age / Sex: 78 y.o., male  PCP: Adam Sell, MD Referring Physician: Alford Highland, MD  Reason for Consultation: Establishing goals of care   HPI/Brief Hospital Course: 78 y.o. male  with past medical history of CAD s/p bypass graft x3, DM2, CHF, aortic stenosis s/p TAVR, HTN and HLD admitted from home on 07/24/2023 with dyspnea and hypoxia. Wife recently hospitalized for acute respiratory infection.  Found to be COVID required emergent intubation 3/9 successfully extubated 3/10 3/11 code stroke called and found to have acute CVA   Palliative medicine was consulted for assisting with goals of care conversations.  Subjective:  Extensive chart review has been completed prior to meeting patient including labs, vital signs, imaging, progress notes, orders, and available advanced directive documents from current and previous encounters.  Visited with Adam Ferguson at his bedside. He is awake, alert, oriented and able to engage in goals of care conversations.  Introduced myself as a Publishing rights manager as a member of the palliative care team. Explained palliative medicine is specialized medical care for people living with serious illness. It focuses on providing relief from the symptoms and stress of a serious illness. The goal is to improve quality of life for both the patient and the family.   Adam Ferguson shares he is the primary caretaker for his wife in the home who has dementia. He has assistance with home health. She requires assistance with most ADL's. He has one son from a previous marriage and also has a close relationship with his wife's son from a previous relationship. Both live out of state but came in to visit and care for their mother while Adam Ferguson has been in the hospital.  Adam Ferguson shares he is feeling much better. He has been able to walk  around his room independently, appetite has much improved, no longer requiring supplemental oxygen, denies pain or discomfort. He shares the original plan was for STR at discharge but he is hopeful that over the weekend he will continue to improve and be able to discharge home. He is eager to get back home to his wife.  Attempted to elicit goals of care. We discussed code status and the difference between Full Code and Do Not Resuscitate. Adam Ferguson is clear that he wishes to remain Full Code. We discussed advanced directives, Adam Ferguson expresses interest in completing AD. Blue booklet reviewed with Adam Ferguson and left at bedside, spiritual care consulted.  I discussed importance of continued conversations with family/support persons and all members of their medical team regarding overall plan of care and treatment options ensuring decisions are in alignment with patients goals of care.  All questions/concerns addressed. Emotional support provided to patient/family/support persons. PMT will continue to follow and support patient as needed.  Objective: Primary Diagnoses: Present on Admission:  Acute on chronic respiratory failure with hypoxia and hypercapnia (HCC)  Acute on chronic systolic CHF (congestive heart failure) (HCC)  Obesity (BMI 30-39.9)   Physical Exam Constitutional:      General: He is not in acute distress.    Appearance: He is not ill-appearing.  Pulmonary:     Effort: Pulmonary effort is normal. No respiratory distress.  Skin:    General: Skin is warm and dry.  Neurological:     Mental Status: He is alert and oriented to person, place, and time.  Psychiatric:        Mood  and Affect: Mood normal.        Thought Content: Thought content normal.     Vital Signs: BP 130/61 (BP Location: Left Arm)   Pulse 71   Temp 98 F (36.7 C)   Resp 18   Ht 6' (1.829 m)   Wt 108.7 kg   SpO2 91%   BMI 32.50 kg/m  Pain Scale: 0-10   Pain Score: 0-No pain  IO: Intake/output  summary:  Intake/Output Summary (Last 24 hours) at 07/30/2023 1555 Last data filed at 07/30/2023 1544 Gross per 24 hour  Intake 920 ml  Output 300 ml  Net 620 ml    LBM: Last BM Date : 07/30/23 Baseline Weight: Weight: (!) 158.8 kg Most recent weight: Weight: 108.7 kg      Assessment and Plan  SUMMARY OF RECOMMENDATIONS   Full Code-Full Scope Advanced Directive booklet reviewed and left at bedside, spiritual care consulted to assist with completion  Palliative Prophylaxis:   Bowel Regimen, Delirium Protocol and Frequent Pain Assessment   Thank you for this consult and allowing Palliative Medicine to participate in the care of Adam Ferguson "Adam Ferguson." Palliative medicine will continue to follow and assist as needed.   Time Total: 75 minutes  Time spent includes: Detailed review of medical records (labs, imaging, vital signs), medically appropriate exam (mental status, respiratory, cardiac, skin), discussed with treatment team, counseling and educating patient, family and staff, documenting clinical information, medication management and coordination of care.   Signed by: Leeanne Deed, DNP, AGNP-C Palliative Medicine    Please contact Palliative Medicine Team phone at [phone] for questions and concerns.  For individual provider: See Loretha Stapler

## 2023-07-30 NOTE — Progress Notes (Signed)
 Progress Note   Patient: Adam Ferguson ZOX:[id number] DOB: 1946/03/06 DOA: 07/24/2023     6 DOS: the patient was seen and examined on 07/30/2023   Brief hospital course: 78 year old Caucasian male with a past medical history that is significant for CAD s/p coronary artery bypass graft x 3, type 2 diabetes, congestive heart failure, aortic stenosis s/p TAVR hypertension, and hyperlipidemia who presented to the ED via EMS with complaints of acute dyspnea and hypoxemia.  Patient is currently intubated and sedated hence history is obtained from ED and EMS documentation.  Per EMS documentation, patient's wife called EMS because patient was having difficulty breathing.  Upon EMS arrival, patient's SpO2 was 44% on room air as he was placed on CPAP and transferred to the ED.  Of note, patient was exposed to COVID-19.  Patient lives with his wife who has dementia. ED Course: Upon arrival in the ED, patient's SpO2 was 44% on CPAP.  He was emergently intubated.  Postintubation, patient was difficult to oxygenate requiring paralytics.  ED workup showed bilateral pneumonia, hypokalemia, hyperglycemia, elevated BNP suggestive of acute CHF exacerbation and bilateral pleural effusions.  Postintubation, patient developed septic shock requiring pressors. Initial vital signs showed HR of  73 beats/minute, BP 102/91 mmHg, the RR 37 breaths/minute, and the oxygen saturation 47% on CPAP and a temperature of 97.60F (36.6C). Patient was tripoding, speaking in one-word sentences and anxious.  He was emergently intubated upon arrival in the ED.  Patient was extremely difficult intubation.  07/24/2023: ED with acute hypoxic respiratory failure due to COVID-19 infection 07/25/2023: Pt remains mechanically intubated and able to follow commands during WUA.  Performed SBT pt initially had adequate tidal volumes, however he later became lethargic and apneic prompting backup rate.  Once mentation improves will perform SBT again  3/11.  Code  stroke called in the evening.  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 3/12.  Case discussed with neurology and okay to continue aspirin Plavix and Lipitor.  PT and OT evaluations.  Still on heated high flow nasal cannula 55% oxygen. 3/13.  PT recommending rehab.  Patient down to bubble high flow nasal cannula 4 L this afternoon. 3/14.  On regular nasal cannula about 4.5 L this morning.  Patient feeling okay. 3/15.  Down to 2 L this morning.  Walked 160 feet with mobility specialist yesterday afternoon.  Assessment and Plan: * Acute on chronic respiratory failure with hypoxia and hypercapnia (HCC) Patient extubated on 3/11.  Patient on heated high flow nasal cannula 58% oxygen on 3/12.  Patient down to bubble high flow nasal cannula 4 L on 3/13.  Patient on regular nasal cannula 4.5 L on 3/14.  Patient down to 2 L nasal cannula on 3/15.  Acute CVA (cerebrovascular accident) (HCC) Could be embolic in nature.  Will need a 30-day monitor.  Neurology okay with aspirin and Plavix and Lipitor at this point.  Septic shock (HCC) Present on admission with COVID-19 infection, pneumonia, elevated lactic acid, acute respiratory failure.  Patient had tachypnea, tachycardia and fever.  Currently on Rocephin and vancomycin.  MRSA PCR positive.  COVID-19 Supportive care.  Patient on Decadron daily.  Acute on chronic systolic CHF (congestive heart failure) (HCC) EF 20 to 25%.  Patient on low-dose Coreg, Aldactone and low-dose Lasix.  Restarted Jardiance this  a.m..  Will switch losartan over to low-dose Entresto this evening.  Respiratory acidosis with metabolic acidosis Secondary to sepsis  Generalized weakness Physical therapy initially recommended rehab but patient  walked 160 feet with mobility specialist yesterday afternoon.  Hematuria Foley catheter removed  Obesity (BMI 30-39.9) Class I with a BMI of 32.23        Subjective: Patient seen this morning sitting up in bed down to 2 L of oxygen.  Breathing okay.  Offers no complaints.  Patient hoping to go home instead of to rehab and he walked 160 feet with the mobility specialist yesterday afternoon.  Physical Exam: Vitals:   07/30/23 0358 07/30/23 0500 07/30/23 0830 07/30/23 1246  BP: 128/73  138/80 132/66  Pulse: 63  60 63  Resp:   18 18  Temp: 98.5 F (36.9 C)  (!) 97.5 F (36.4 C) (!) 97.3 F (36.3 C)  TempSrc:      SpO2: 97%  95% 93%  Weight:  108.7 kg    Height:       Physical Exam HENT:     Head: Normocephalic.     Mouth/Throat:     Pharynx: No oropharyngeal exudate.  Eyes:     General: Lids are normal.     Conjunctiva/sclera: Conjunctivae normal.  Cardiovascular:     Rate and Rhythm: Normal rate and regular rhythm.     Heart sounds: Normal heart sounds, S1 normal and S2 normal.  Pulmonary:     Breath sounds: Examination of the right-lower field reveals decreased breath sounds. Examination of the left-lower field reveals decreased breath sounds. Decreased breath sounds present. No wheezing, rhonchi or rales.  Abdominal:     Palpations: Abdomen is soft.     Tenderness: There is no abdominal tenderness.  Musculoskeletal:     Right lower leg: No swelling.     Left lower leg: No swelling.  Skin:    General: Skin is warm.     Findings: No rash.  Neurological:     Mental Status: He is alert.     Data Reviewed: Creatinine 0.85, sodium 134  Family Communication: Updated Matthew on the phone  Disposition: Status is: Inpatient Remains inpatient appropriate because: Trying to see if we can get off oxygen.  Down to 2 L today this morning when I saw him Planned Discharge Destination: Since he walked well with the mobility specialist yesterday afternoon I am assuming PT  will change their recommendations to home with home health.    Time spent: 28 minutes  Author: Alford Highland, MD 07/30/2023 12:51 PM  For on call review [email or web address].

## 2023-07-30 NOTE — Plan of Care (Signed)
  Problem: Education: Goal: Knowledge of risk factors and measures for prevention of condition will improve Outcome: Progressing   Problem: Respiratory: Goal: Will maintain a patent airway Outcome: Progressing Goal: Complications related to the disease process, condition or treatment will be avoided or minimized Outcome: Progressing   Problem: Fluid Volume: Goal: Hemodynamic stability will improve Outcome: Progressing

## 2023-07-31 LAB — GLUCOSE, CAPILLARY
Glucose-Capillary: 152 mg/dL — ABNORMAL HIGH (ref 70–99)
Glucose-Capillary: 181 mg/dL — ABNORMAL HIGH (ref 70–99)
Glucose-Capillary: 224 mg/dL — ABNORMAL HIGH (ref 70–99)
Glucose-Capillary: 258 mg/dL — ABNORMAL HIGH (ref 70–99)

## 2023-07-31 NOTE — TOC Initial Note (Signed)
 Transition of Care [facility]) - Initial/Assessment Note    Patient Details  Name: Adam Ferguson MRN: [id number] Date of Birth: Apr 29, 1946  Transition of Care [facility]) CM/SW Contact:    Liliana Cline, LCSW Phone Number: 07/31/2023, 12:51 PM  Clinical Narrative:                 CSW spoke with patient regarding DC tomorrow with [facility] per MD. Patient lives with his wife and drives at baseline. PCP is Dr. Sampson Goon. [facility]. Patient has access to a RW if needed. Patient is agreeable to PT rec for a rollator - referral made to [facility] for bedside delivery prior to DC tomorrow. Patient is agreeable to [facility] - no agency preferences, confirmed home address. Referral made to [facility] with [facility] who is aware of DC tomorrow. Patient states he will set himself up an [place] for transport home when DC. Patient states his wife uses [facility] for Aide services and he is going to get them set up for himself too.  Expected Discharge Plan: Home w Home Health Services Barriers to Discharge: Continued Medical Work up   Patient Goals and CMS Choice Patient states their goals for this hospitalization and ongoing recovery are:: home with home health CMS Medicare.gov Compare Post Acute Care list provided to:: Patient Choice offered to / list presented to : Patient      Expected Discharge Plan and Services       Living arrangements for the past 2 months: Single Family Home                 DME Arranged: Walker rolling with seat DME Agency: [facility] Date DME Agency Contacted: 07/31/23   Representative spoke with at DME Agency: [facility] HH Arranged: PT, OT HH Agency: [facility] Date [facility] Agency Contacted: 07/31/23   Representative spoke with at [facility] Agency: Elnita Maxwell  Prior Living Arrangements/Services Living arrangements for the past 2 months: Single Family Home Lives with:: Spouse Patient language and need for interpreter reviewed:: Yes Do you feel safe going back to  the place where you live?: Yes      Need for Family Participation in Patient Care: Yes (Comment) Care giver support system in place?: Yes (comment)   Criminal Activity/Legal Involvement Pertinent to Current Situation/Hospitalization: No - Comment as needed  Activities of Daily Living   ADL Screening (condition at time of admission) Independently performs ADLs?: Yes (appropriate for developmental age) Is the patient deaf or have difficulty hearing?: No Does the patient have difficulty seeing, even when wearing glasses/contacts?: No Does the patient have difficulty concentrating, remembering, or making decisions?: No  Permission Sought/Granted Permission sought to share information with : Oceanographer granted to share information with : Yes, Verbal Permission Granted     Permission granted to share info w AGENCY: Home Health and DME agencies        Emotional Assessment       Orientation: : Oriented to Self, Oriented to Place, Oriented to  Time, Oriented to Situation Alcohol / Substance Use: Not Applicable Psych Involvement: No (comment)  Admission diagnosis:  Lactic acidosis [E87.20] Acute respiratory failure with hypoxia and hypercapnia (HCC) [J96.01, J96.02] Acute on chronic respiratory failure with hypoxia and hypercapnia (HCC) [J96.21, J96.22] Respiratory acidosis with metabolic acidosis [E87.4] Acute on chronic congestive heart failure, unspecified heart failure type (HCC) [I50.9] Acute sepsis (HCC) [A41.9] COVID-19 [U07.1] Patient Active Problem List   Diagnosis Date Noted   Hematuria  07/28/2023   Generalized weakness 07/28/2023   Septic shock (HCC) 07/27/2023   Acute CVA (cerebrovascular accident) (HCC) 07/27/2023   Acute on chronic respiratory failure with hypoxia and hypercapnia (HCC) 07/24/2023   Respiratory acidosis with metabolic acidosis 07/24/2023   COVID-19 07/24/2023   Obesity (BMI 30-39.9) 12/31/2021   Chest pain 12/30/2021    Type 2 diabetes mellitus without complication, with long-term current use of insulin (HCC) 05/15/2020   Acute cardiogenic pulmonary edema (HCC) 05/15/2020   Acute on chronic systolic CHF (congestive heart failure) (HCC) 05/15/2020   S/P TAVR (transcatheter aortic valve replacement) 04/24/2020   CHF (congestive heart failure) (HCC) 03/29/2020   Acute on chronic combined systolic and diastolic CHF (congestive heart failure) (HCC) 03/29/2020   Major depressive disorder 03/29/2020   Diabetes mellitus without complication (HCC)    Essential hypertension    Acute respiratory failure (HCC)    CAD (coronary artery disease)    Hx of CABG 11/03/2017   PCP:  Mick Sell, MD [phone] [place], [place] - [address] [place] [address] Phone: [phone] Fax: [phone]  [facility] [address] [place] [address] Phone: [phone] Fax: [phone]     Social Drivers of Health (SDOH) Social History: SDOH Screenings   Food Insecurity: Patient Unable To Answer (07/24/2023)  Housing: Patient Unable To Answer (07/24/2023)  Transportation Needs: Patient Unable To Answer (07/24/2023)  Utilities: Patient Unable To Answer (07/24/2023)  Financial Resource Strain: Low Risk  (04/18/2023)   Received from [facility]  Physical Activity: Unknown (12/29/2017)   Received from [facility]  Social Connections: Patient Unable To Answer (07/24/2023)  Stress: No Stress Concern Present (12/29/2017)   Received from [facility]  Tobacco Use: Low Risk  (07/24/2023)   SDOH Interventions:     Readmission Risk Interventions     No data to display

## 2023-07-31 NOTE — Progress Notes (Signed)
 Progress Note   Patient: Adam Ferguson EAV:[id number] DOB: July 09, 1945 DOA: 07/24/2023     7 DOS: the patient was seen and examined on 07/31/2023   Brief hospital course: 78 year old Caucasian male with a past medical history that is significant for CAD s/p coronary artery bypass graft x 3, type 2 diabetes, congestive heart failure, aortic stenosis s/p TAVR hypertension, and hyperlipidemia who presented to the ED via EMS with complaints of acute dyspnea and hypoxemia.  Patient is currently intubated and sedated hence history is obtained from ED and EMS documentation.  Per EMS documentation, patient's wife called EMS because patient was having difficulty breathing.  Upon EMS arrival, patient's SpO2 was 44% on room air as he was placed on CPAP and transferred to the ED.  Of note, patient was exposed to COVID-19.  Patient lives with his wife who has dementia. ED Course: Upon arrival in the ED, patient's SpO2 was 44% on CPAP.  He was emergently intubated.  Postintubation, patient was difficult to oxygenate requiring paralytics.  ED workup showed bilateral pneumonia, hypokalemia, hyperglycemia, elevated BNP suggestive of acute CHF exacerbation and bilateral pleural effusions.  Postintubation, patient developed septic shock requiring pressors. Initial vital signs showed HR of  73 beats/minute, BP 102/91 mmHg, the RR 37 breaths/minute, and the oxygen saturation 47% on CPAP and a temperature of 97.55F (36.6C). Patient was tripoding, speaking in one-word sentences and anxious.  He was emergently intubated upon arrival in the ED.  Patient was extremely difficult intubation.  07/24/2023: ED with acute hypoxic respiratory failure due to COVID-19 infection 07/25/2023: Pt remains mechanically intubated and able to follow commands during WUA.  Performed SBT pt initially had adequate tidal volumes, however he later became lethargic and apneic prompting backup rate.  Once mentation improves will perform SBT again  3/11.  Code  stroke called in the evening.  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 3/12.  Case discussed with neurology and okay to continue aspirin Plavix and Lipitor.  PT and OT evaluations.  Still on heated high flow nasal cannula 55% oxygen. 3/13.  PT recommending rehab.  Patient down to bubble high flow nasal cannula 4 L this afternoon. 3/14.  On regular nasal cannula about 4.5 L this morning.  Patient feeling okay. 3/15.  Down to 2 L this morning.  Walked 160 feet with mobility specialist yesterday afternoon. 316.  Patient off oxygen.  Patient interested in going home tomorrow.  Assessment and Plan: * Acute on chronic respiratory failure with hypoxia and hypercapnia (HCC) Patient extubated on 3/11.  Patient on heated high flow nasal cannula 58% oxygen on 3/12.  Patient down to bubble high flow nasal cannula 4 L on 3/13.  Patient on regular nasal cannula 4.5 L on 3/14.  Patient down to 2 L nasal cannula on 3/15.  Patient off oxygen on 3/16.  Acute CVA (cerebrovascular accident) (HCC) Could be embolic in nature.  Will need a 30-day monitor.  Neurology okay with aspirin and Plavix and Lipitor at this point.  Septic shock (HCC) Present on admission with COVID-19 infection, pneumonia, elevated lactic acid, acute respiratory failure.  Patient had tachypnea, tachycardia and fever.  Currently on Rocephin (should finish tonight) and vancomycin (completed).  MRSA PCR positive.  COVID-19 Supportive care.  Patient on Decadron daily.  Acute on chronic systolic  CHF (congestive heart failure) (HCC) EF 20 to 25%.  Patient on low-dose Coreg, Aldactone, Jardiance, low-dose Entresto, Lasix.  Respiratory acidosis with metabolic acidosis Secondary to sepsis  Generalized  weakness Initially physical therapy recommended rehab.  Improved during the hospital course.  Patient will go home with home health tomorrow.  Hematuria Foley catheter removed  Obesity (BMI 30-39.9) Class I with a BMI of 32.50        Subjective: Patient feeling better.  Able to come off oxygen.  Had a little shortness of breath yesterday evening.  Patient concerned about going home today and would like to go home tomorrow.  Physical Exam: Vitals:   07/30/23 2045 07/30/23 2309 07/31/23 0452 07/31/23 0831  BP: 136/74 125/63 114/62 129/72  Pulse: 72 71 (!) 59 63  Resp:    16  Temp: 98.5 F (36.9 C) 98.5 F (36.9 C) 98.6 F (37 C) 98.2 F (36.8 C)  TempSrc: Oral Oral Axillary Oral  SpO2: 90% 90% 94% 93%  Weight:   108.7 kg   Height:       Physical Exam HENT:     Head: Normocephalic.     Mouth/Throat:     Pharynx: No oropharyngeal exudate.  Eyes:     General: Lids are normal.     Conjunctiva/sclera: Conjunctivae normal.  Cardiovascular:     Rate and Rhythm: Normal rate and regular rhythm.     Heart sounds: Normal heart sounds, S1 normal and S2 normal.  Pulmonary:     Breath sounds: Examination of the right-lower field reveals decreased breath sounds. Examination of the left-lower field reveals decreased breath sounds. Decreased breath sounds present. No wheezing, rhonchi or rales.  Abdominal:     Palpations: Abdomen is soft.     Tenderness: There is no abdominal tenderness.  Musculoskeletal:     Right lower leg: No swelling.     Left lower leg: No swelling.  Skin:    General: Skin is warm.     Findings: No rash.  Neurological:     Mental Status: He is alert.     Data Reviewed: Sodium 134, creatinine 0.85  Family Communication: Spoke with nephew on the phone  Disposition: Status is: Inpatient Remains inpatient appropriate because: Likely discharge home tomorrow morning  Planned Discharge Destination: Home with Home Health    Time spent: 29  minutes  Author: Alford Highland, MD 07/31/2023 10:47 AM  For on call review [email or web address].

## 2023-07-31 NOTE — Plan of Care (Signed)
  Problem: Education: Goal: Knowledge of risk factors and measures for prevention of condition will improve Outcome: Progressing   Problem: Respiratory: Goal: Will maintain a patent airway Outcome: Progressing Goal: Complications related to the disease process, condition or treatment will be avoided or minimized Outcome: Progressing   Problem: Fluid Volume: Goal: Hemodynamic stability will improve Outcome: Progressing

## 2023-07-31 NOTE — Progress Notes (Signed)
   07/31/23 1115  Spiritual Encounters  Type of Visit Initial  Care provided to: Patient  Referral source Clinical staff  Reason for visit Advance directives  OnCall Visit No  Spiritual Framework  Presenting Themes Caregiving needs (Patient chosing to stay in hospital so wife with dementia does not get sick)  Interventions  Spiritual Care Interventions Made Established relationship of care and support;Reflective listening  Intervention Outcomes  Outcomes Connection to spiritual care;Awareness around self/spiritual resourses  Spiritual Care Plan  Spiritual Care Issues Still Outstanding No further spiritual care needs at this time (see row info)

## 2023-08-01 ENCOUNTER — Other Ambulatory Visit: Payer: Self-pay

## 2023-08-01 DIAGNOSIS — I639 Cerebral infarction, unspecified: Secondary | ICD-10-CM | POA: Diagnosis not present

## 2023-08-01 DIAGNOSIS — A419 Sepsis, unspecified organism: Secondary | ICD-10-CM | POA: Diagnosis not present

## 2023-08-01 DIAGNOSIS — E119 Type 2 diabetes mellitus without complications: Secondary | ICD-10-CM

## 2023-08-01 DIAGNOSIS — Z794 Long term (current) use of insulin: Secondary | ICD-10-CM

## 2023-08-01 DIAGNOSIS — J9621 Acute and chronic respiratory failure with hypoxia: Secondary | ICD-10-CM | POA: Diagnosis not present

## 2023-08-01 LAB — GLUCOSE, CAPILLARY
Glucose-Capillary: 191 mg/dL — ABNORMAL HIGH (ref 70–99)
Glucose-Capillary: 226 mg/dL — ABNORMAL HIGH (ref 70–99)

## 2023-08-01 MED ORDER — INSULIN GLARGINE 100 UNIT/ML SOLOSTAR PEN: 10.0000 [IU] | PEN_INJECTOR | Freq: Two times a day (BID) | SUBCUTANEOUS | Status: AC

## 2023-08-01 MED ORDER — FUROSEMIDE 40 MG PO TABS
40.0000 mg | ORAL_TABLET | Freq: Every day | ORAL | 0 refills | Status: AC
Start: 2023-08-01 — End: ?
  Filled 2023-08-01: qty 30, 30d supply, fill #0

## 2023-08-01 MED ORDER — SPIRONOLACTONE 25 MG PO TABS
12.5000 mg | ORAL_TABLET | Freq: Every day | ORAL | 0 refills | Status: AC
  Filled 2023-08-01: qty 30, 60d supply, fill #0

## 2023-08-01 MED ORDER — GLUCERNA SHAKE PO LIQD
237.0000 mL | Freq: Two times a day (BID) | ORAL | 0 refills | Status: AC
  Filled 2023-08-01: qty 14220, 30d supply, fill #0

## 2023-08-01 MED ORDER — CARVEDILOL 3.125 MG PO TABS
3.1250 mg | ORAL_TABLET | Freq: Two times a day (BID) | ORAL | 0 refills | Status: AC
  Filled 2023-08-01: qty 60, 30d supply, fill #0

## 2023-08-01 NOTE — Plan of Care (Signed)
  Problem: Coping: Goal: Psychosocial and spiritual needs will be supported Outcome: Adequate for Discharge

## 2023-08-01 NOTE — TOC Transition Note (Signed)
 Transition of Care [facility]) - Discharge Note   Patient Details  Name: Adam Ferguson MRN: [id number] Date of Birth: 1945/07/20  Transition of Care [facility]) CM/SW Contact:  Truddie Hidden, RN Phone Number: 08/01/2023, 10:11 AM   Clinical Narrative:    Spoke with Jon from [employer] to confirm if RW was delivered. He will check with his office. Elnita Maxwell from [facility] sent a notification regarding likely discharge.    10:17am [employer] office did not receive request.  Request sent to Jon.      Barriers to Discharge: Continued Medical Work up   Patient Goals and CMS Choice Patient states their goals for this hospitalization and ongoing recovery are:: home with home health CMS Medicare.gov Compare Post Acute Care list provided to:: Patient Choice offered to / list presented to : Patient      Discharge Placement                       Discharge Plan and Services Additional resources added to the After Visit Summary for                  DME Arranged: Walker rolling with seat DME Agency: [facility] Date DME Agency Contacted: 07/31/23   Representative spoke with at DME Agency: [facility] HH Arranged: PT, OT HH Agency: [facility] Date [facility] Agency Contacted: 07/31/23   Representative spoke with at [facility] Agency: Elnita Maxwell  Social Drivers of Health (SDOH) Interventions SDOH Screenings   Food Insecurity: Patient Unable To Answer (07/24/2023)  Housing: Patient Unable To Answer (07/24/2023)  Transportation Needs: Patient Unable To Answer (07/24/2023)  Utilities: Patient Unable To Answer (07/24/2023)  Financial Resource Strain: Low Risk  (04/18/2023)   Received from [facility]  Physical Activity: Unknown (12/29/2017)   [facility]  Social Connections: Patient Unable To Answer (07/24/2023)  Stress: No Stress Concern Present (12/29/2017)   Received from [facility]  Tobacco Use: Low Risk  (07/24/2023)     Readmission  Risk Interventions     No data to display

## 2023-08-01 NOTE — Discharge Summary (Signed)
 Physician Discharge Summary   Patient: Adam Ferguson MRN: [id number] DOB: [birth year]  Admit date:     07/24/2023  Discharge date: 08/01/23  Discharge Physician: Alford Highland   PCP: Mick Sell, MD   Recommendations at discharge:   Follow-up PCP 5 days Keep appointment with cardiology on Thursday. Neurology recommended a 30-day heart monitor to rule out atrial fibrillation. Recommend outpatient sleep study  Discharge Diagnoses: Principal Problem:   Acute on chronic respiratory failure with hypoxia and hypercapnia (HCC) Active Problems:   Type 2 diabetes mellitus without complication, with long-term current use of insulin (HCC)   Septic shock (HCC)   Acute CVA (cerebrovascular accident) (HCC)   COVID-19   Acute on chronic systolic CHF (congestive heart failure) (HCC)   Respiratory acidosis with metabolic acidosis   Obesity (BMI 30-39.9)   Hematuria   Generalized weakness    Hospital Course: 78 year old Caucasian male with a past medical history that is significant for CAD s/p coronary artery bypass graft x 3, type 2 diabetes, congestive heart failure, aortic stenosis s/p TAVR hypertension, and hyperlipidemia who presented to the ED via EMS with complaints of acute dyspnea and hypoxemia.  Patient is currently intubated and sedated hence history is obtained from ED and EMS documentation.  Per EMS documentation, patient's wife called EMS because patient was having difficulty breathing.  Upon EMS arrival, patient's SpO2 was 44% on room air as he was placed on CPAP and transferred to the ED.  Of note, patient was exposed to COVID-19.  Patient lives with his wife who has dementia. ED Course: Upon arrival in the ED, patient's SpO2 was 44% on CPAP.  He was emergently intubated.  Postintubation, patient was difficult to oxygenate requiring paralytics.  ED workup showed bilateral pneumonia, hypokalemia, hyperglycemia, elevated BNP suggestive of acute CHF exacerbation and bilateral  pleural effusions.  Postintubation, patient developed septic shock requiring pressors. Initial vital signs showed HR of  73 beats/minute, BP 102/91 mmHg, the RR 37 breaths/minute, and the oxygen saturation 47% on CPAP and a temperature of 97.15F (36.6C). Patient was tripoding, speaking in one-word sentences and anxious.  He was emergently intubated upon arrival in the ED.  Patient was extremely difficult intubation.  07/24/2023: ED with acute hypoxic respiratory failure due to COVID-19 infection 07/25/2023: Pt remains mechanically intubated and able to follow commands during [facility].  Performed SBT pt initially had adequate tidal volumes, however he later became lethargic and apneic prompting backup rate.  Once mentation improves will perform SBT again  3/11.  Code stroke called in the evening.  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 3/12.  Case discussed with neurology and okay to continue aspirin Plavix and Lipitor.  PT and OT evaluations.  Still on heated high flow nasal cannula 55% oxygen. 3/13.  PT recommending rehab.  Patient down to bubble high flow nasal cannula 4 L this afternoon. 3/14.  On regular nasal cannula about 4.5 L this morning.  Patient feeling okay. 3/15.  Down to 2 L this morning.  Walked 160 feet with mobility specialist yesterday afternoon. 3/16.  Patient off oxygen.  Patient interested in going home tomorrow. 3/17.  Recommend outpatient sleep study.  Patient ambulated and most of the time held her saturations around 90% but did drop down for about a minute and a half  at 88%.  Patient does not want to go home with oxygen and wants to go home today.  Assessment and Plan: * Acute on chronic respiratory failure with hypoxia and hypercapnia  (HCC) Patient extubated on 3/11.  Patient on heated high flow nasal cannula 58% oxygen on 3/12.  Patient down to bubble high flow nasal cannula 4 L on 3/13.  Patient on regular nasal cannula 4.5 L on 3/14.  Patient down to 2 L nasal cannula on 3/15.  Patient off oxygen on 3/16.  On 3/17.  Patient ambulated and held his saturations mostly around 90%.  Brief dip down to 88% and recovered.  Patient does not want to go home with oxygen and since it was only a brief dip down I am okay going home on room air.  Type 2 diabetes mellitus without complication, with long-term current use of insulin (HCC) Continue Jardiance and glargine insulin 10 units subcutaneous injection twice daily.  Acute CVA (cerebrovascular accident) (HCC) Could be embolic in nature.  Will need a 30-day monitor.  Neurology okay with aspirin and Plavix and Lipitor at this point.  Septic shock (HCC) Present on admission with COVID-19 infection, pneumonia, elevated lactic acid, acute respiratory failure.  Patient had tachypnea, tachycardia and fever.  Completed vancomycin and Rocephin.  MRSA PCR positive.  COVID-19 Supportive care.  Completed Decadron  Acute on chronic systolic CHF (congestive heart failure) (HCC) EF 20 to 25%.  Patient on low-dose Coreg, Aldactone, Jardiance, Entresto, Lasix.  Respiratory acidosis with metabolic acidosis Secondary to sepsis  Generalized weakness Initially physical therapy recommended rehab.  Improved during the hospital course.  Patient will go home with home health.  Hematuria Foley catheter removed and is improved.  Obesity (BMI 30-39.9) Class I with a BMI of 32.05         Consultants: Cardiology, neurology, critical care team Procedures performed: None Disposition: Home health Diet recommendation:  Cardiac diet DISCHARGE MEDICATION: Allergies as of 08/01/2023   No Known Allergies      Medication List     TAKE these medications    aspirin 81 MG chewable tablet Chew 1  tablet (81 mg total) by mouth daily.   atorvastatin 80 MG tablet Commonly known as: LIPITOR Take 80 mg by mouth at bedtime.   carvedilol 3.125 MG tablet Commonly known as: COREG Take 1 tablet (3.125 mg total) by mouth in the morning and at bedtime.   clopidogrel 75 MG tablet Commonly known as: PLAVIX Take 1 tablet (75 mg total) by mouth every evening.   feeding supplement (GLUCERNA SHAKE) Liqd Take 237 mLs by mouth 2 (two) times daily between meals.   FLUoxetine 40 MG capsule Commonly known as: PROZAC Take 40 mg by mouth daily.   furosemide 40 MG tablet Commonly known as: LASIX Take 1 tablet (40 mg total) by mouth daily. What changed:  medication strength how much to take Another medication with the same name was removed. Continue taking this medication, and follow the directions you see here.   insulin glargine 100 UNIT/ML Solostar Pen Commonly known as: LANTUS Inject 10 Units into the skin 2 (two) times daily. What changed:  how much to take when to take this   isosorbide mononitrate 30 MG 24 hr tablet Commonly known as: IMDUR Take 1 tablet (30 mg total) by mouth daily.   Jardiance 25 MG Tabs tablet Generic drug: empagliflozin Take 25 mg by mouth daily.   Ozempic (1 MG/DOSE) 4 MG/3ML Sopn Generic drug: Semaglutide (1 MG/DOSE) Inject 1  mg into the skin once a week. Sunday   sacubitril-valsartan 49-51 MG Commonly known as: ENTRESTO Take 1 tablet by mouth in the morning and at bedtime.   spironolactone 25 MG tablet Commonly known as: ALDACTONE Take 0.5 tablets (12.5 mg total) by mouth daily.               Durable Medical Equipment  (From admission, onward)           Start     Ordered   07/31/23 1314  For home use only DME Other see comment  Once       Comments: Rollator  Question:  Length of Need  Answer:  12 Months   07/31/23 1314            Follow-up Information     Gerlene Fee, PA-C. Go in 1 week(s).   Specialty:  Cardiology Contact information: [address] [place] [address] [phone]         keep appointment with cardiology on thursday Follow up.                 Discharge Exam: Filed Weights   07/30/23 0500 07/31/23 0452 08/01/23 0455  Weight: 108.7 kg 108.7 kg 107.2 kg   Physical Exam HENT:     Head: Normocephalic.     Mouth/Throat:     Pharynx: No oropharyngeal exudate.  Eyes:     General: Lids are normal.     Conjunctiva/sclera: Conjunctivae normal.  Cardiovascular:     Rate and Rhythm: Normal rate and regular rhythm.     Heart sounds: Normal heart sounds, S1 normal and S2 normal.  Pulmonary:     Breath sounds: Examination of the right-lower field reveals decreased breath sounds. Examination of the left-lower field reveals decreased breath sounds. Decreased breath sounds present. No wheezing, rhonchi or rales.  Abdominal:     Palpations: Abdomen is soft.     Tenderness: There is no abdominal tenderness.  Musculoskeletal:     Right lower leg: No swelling.     Left lower leg: No swelling.  Skin:    General: Skin is warm.     Findings: No rash.  Neurological:     Mental Status: He is alert.      Condition at discharge: stable  The results of significant diagnostics from this hospitalization (including imaging, microbiology, ancillary and laboratory) are listed below for reference.   Imaging Studies: DG Chest Port 1 View Result Date: 07/27/2023 CLINICAL DATA:  Shortness of breath. EXAM: PORTABLE CHEST 1 VIEW COMPARISON:  July 25, 2023. FINDINGS: Stable cardiomediastinal silhouette. Sternotomy wires are noted. Right upper lobe lung opacity is noted concerning for pneumonia. Left lung is unremarkable. Bony thorax is unremarkable. IMPRESSION: Right upper lobe opacity is noted concerning for pneumonia. Electronically Signed   By: Lupita Raider M.D.   On: 07/27/2023 11:55   MR BRAIN WO CONTRAST Result Date: 07/27/2023 CLINICAL DATA:  Stroke follow-up  EXAM: MRI HEAD WITHOUT CONTRAST TECHNIQUE: Multiplanar, multiecho pulse sequences of the brain and surrounding structures were obtained without intravenous contrast. COMPARISON:  None Available. FINDINGS: Brain: Acute infarct within the left PICA territory. Punctate foci of acute ischemia within the right parietal white matter and right cerebellum. Chronic microhemorrhages in the brainstem and cerebellum and peripherally within the supratentorial brain. There is multifocal hyperintense T2-weighted signal within the white matter. Parenchymal volume and CSF spaces are normal. The midline structures are normal. Vascular: Normal flow voids. Skull and upper cervical spine:  Normal calvarium and skull base. Visualized upper cervical spine and soft tissues are normal. Sinuses/Orbits:No paranasal sinus fluid levels or advanced mucosal thickening. No mastoid or middle ear effusion. Normal orbits. IMPRESSION: 1. Acute infarct within the left PICA territory. No hemorrhage or mass effect. 2. Punctate foci of acute ischemia within the right parietal white matter and right cerebellum. 3. 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 Electronically Signed   By: Deatra Robinson M.D.   On: 07/27/2023 03:18   CT ANGIO HEAD NECK W WO CM Result Date: 07/26/2023 CLINICAL DATA:  Stroke/TIA EXAM: CT ANGIOGRAPHY HEAD AND NECK WITH AND WITHOUT CONTRAST TECHNIQUE: Multidetector CT imaging of the head and neck was performed using the standard protocol during bolus administration of intravenous contrast. Multiplanar CT image reconstructions and MIPs were obtained to evaluate the vascular anatomy. Carotid stenosis measurements (when applicable) are obtained utilizing NASCET criteria, using the distal internal carotid diameter as the denominator. RADIATION DOSE REDUCTION: This exam was performed according to the departmental dose-optimization  program which includes automated exposure control, adjustment of the mA and/or kV according to patient size and/or use of iterative reconstruction technique. CONTRAST:  75mL OMNIPAQUE IOHEXOL 350 MG/ML SOLN COMPARISON:  None Available. FINDINGS: CTA NECK FINDINGS Skeleton: No acute abnormality or high grade bony spinal canal stenosis. Other neck: Normal pharynx, larynx and major salivary glands. No cervical lymphadenopathy. Unremarkable thyroid gland. Upper chest: Multifocal consolidation within both lung apices, right worse than left Aortic arch: There is calcific atherosclerosis of the aortic arch. Conventional 3 vessel aortic branching pattern. RIGHT carotid system: No dissection, occlusion or aneurysm. Mild atherosclerotic calcification at the carotid bifurcation without hemodynamically significant stenosis. LEFT carotid system: Atherosclerotic calcification within the common carotid artery causing approximately 50% stenosis. Mild atherosclerotic calcification at the carotid bifurcation without hemodynamically significant stenosis. Vertebral arteries: Codominant configuration. There is no dissection, occlusion or flow-limiting stenosis to the skull base (V1-V3 segments). CTA HEAD FINDINGS POSTERIOR CIRCULATION: Right atherosclerotic calcification of both vertebral artery V4 segments causing severe right and moderate left stenosis. No proximal occlusion of the anterior or inferior cerebellar arteries. Minimal basilar artery atherosclerotic calcification without significant stenosis. Mild multifocal stenosis of the right PCA P1 segment. Left PCA is normal. ANTERIOR CIRCULATION: Atherosclerotic calcification of the internal carotid arteries at the skull base without hemodynamically significant stenosis. Anterior cerebral arteries are normal. Middle cerebral arteries are normal. Venous sinuses: As permitted by contrast timing, patent. Anatomic variants: None Review of the MIP images confirms the above findings.  IMPRESSION: 1. No emergent large vessel occlusion. 2. 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 3. Approximately 50% stenosis of the left common carotid artery. 4. Multifocal consolidation within both lung apices, right worse than left, concerning for pneumonia. Electronically Signed   By: Deatra Robinson M.D.   On: 07/26/2023 22:11   CT CHEST ABDOMEN PELVIS WO CONTRAST Result Date: 07/26/2023 CLINICAL DATA:  Abdominal distention, shortness of breath, COVID. EXAM: CT CHEST, ABDOMEN AND PELVIS WITHOUT CONTRAST TECHNIQUE: Multidetector CT imaging of the chest, abdomen and pelvis was performed following the standard protocol without IV contrast. RADIATION DOSE REDUCTION: This exam was performed according to the departmental dose-optimization program which includes automated exposure control, adjustment of the mA and/or kV according to patient size and/or use of iterative reconstruction technique. COMPARISON:  CT chest abdomen and pelvis 07/24/2023. FINDINGS: CT CHEST FINDINGS Cardiovascular: Heart is mildly enlarged. Aorta is normal in size. There is no  pericardial effusion. Aortic valve replacement present. Mediastinum/Nodes: No enlarged mediastinal, hilar, or axillary lymph nodes. Thyroid gland, trachea, and esophagus demonstrate no significant findings. Lungs/Pleura: There small bilateral pleural effusions, increased on the right. Airspace disease throughout the right lung has increased. Patchy airspace disease in the left lower lobe and posterior left upper lobe have increased. Airspace consolidation in the bilateral lower lobes with air bronchograms again noted, increased on the right. No pneumothorax. Musculoskeletal: Sternotomy wires are present. CT ABDOMEN PELVIS FINDINGS Hepatobiliary: Gallstones are present. There is no biliary ductal dilatation. The liver is within normal limits. Pancreas: Unremarkable. No pancreatic ductal dilatation or surrounding inflammatory changes. Spleen:  Normal in size without focal abnormality. Adrenals/Urinary Tract: Adrenal glands are unremarkable. Kidneys are normal, without renal calculi, focal lesion, or hydronephrosis. Bladder is unremarkable. Stomach/Bowel: Stomach is within normal limits. Appendix appears normal. No evidence of bowel wall thickening, distention, or inflammatory changes. Vascular/Lymphatic: Aortic atherosclerosis. No enlarged abdominal or pelvic lymph nodes. Reproductive: Prostate gland is enlarged. Other: There is a small fat containing umbilical hernia. There is no ascites. Musculoskeletal: No fracture is seen. IMPRESSION: 1. Worsening bilateral airspace disease worrisome for worsening pneumonia. 2. Small bilateral pleural effusions, increased on the right. 3. No acute localizing process in the abdomen or pelvis. 4. Cholelithiasis. 5. Aortic atherosclerosis. Aortic Atherosclerosis (ICD10-I70.0). Electronically Signed   By: Darliss Cheney M.D.   On: 07/26/2023 18:38   CT HEAD CODE STROKE WO CONTRAST Addendum Date: 07/26/2023 ADDENDUM REPORT: 07/26/2023 16:17 ADDENDUM: Impression #1 discussed by telephone on 07/26/2023 at 4:17 pm with provider DANA NELSON , who verbally acknowledged these results. Electronically Signed   By: Jackey Loge D.O.   On: 07/26/2023 16:17   Result Date: 07/26/2023 CLINICAL DATA:  Code stroke. Neuro deficit, acute, stroke suspected. EXAM: CT HEAD WITHOUT CONTRAST TECHNIQUE: Contiguous axial images were obtained from the base of the skull through the vertex without intravenous contrast. RADIATION DOSE REDUCTION: This exam was performed according to the departmental dose-optimization program which includes automated exposure control, adjustment of the mA and/or kV according to patient size and/or use of iterative reconstruction technique. COMPARISON:  Head CT 07/24/2023. FINDINGS: Brain: No age-advanced or lobar predominant cerebral atrophy. 3 cm acute infarct within the left cerebellar hemisphere, new from the  prior head CT of 07/24/2023. Patchy and ill-defined hypoattenuation within the cerebral white matter, nonspecific but compatible with mild chronic small vessel ischemic disease. There is no acute intracranial hemorrhage. No demarcated cortical infarct. No extra-axial fluid collection. No evidence of an intracranial mass. No midline shift. Vascular: No hyperdense vessel.  Atherosclerotic calcifications. Skull: No calvarial fracture or aggressive osseous lesion. Sinuses/Orbits: No mass or acute finding within the imaged orbits. Minimal mucosal thickening within a posterior right ethmoid air cell, and within the right sphenoid sinus. ASPECTS ([place]berta Stroke Program Early CT Score) - Ganglionic level infarction (caudate, lentiform nuclei, internal capsule, insula, M1-M3 cortex): 7 - Supraganglionic infarction (M4-M6 cortex): 3 Total score (0-10 with 10 being normal): 10 Attempts are being made to reach the ordering provider at this time. IMPRESSION: 1. 3 cm acute infarct within the left cerebellar hemisphere, new from the prior head CT of 07/24/2023. 2. Mild chronic small vessel ischemic changes within the cerebral white matter. 3. Minor paranasal sinus mucosal thickening. Electronically Signed: By: Jackey Loge D.O. On: 07/26/2023 16:12   DG Abd 1 View Result Date: 07/25/2023 CLINICAL DATA:  OG tube placement EXAM: ABDOMEN - 1 VIEW COMPARISON:  CT 07/24/2023 FINDINGS: Enteric tube tip and side  port overlie the gastric fundal region. Upper gas pattern is unremarkable IMPRESSION: Enteric tube tip and side port overlie the gastric fundal region Electronically Signed   By: Jasmine Pang M.D.   On: 07/25/2023 15:34   DG Chest 1 View Result Date: 07/25/2023 CLINICAL DATA:  ARDS. EXAM: CHEST  1 VIEW COMPARISON:  07/24/2023 FINDINGS: There is a right IJ catheter with tip projecting over the distal SVC. Enteric tube tip is in the stomach. ET tube tip is above the carina. Stable cardiomediastinal contours. Lung volumes are  low. Pulmonary vascular congestion. No airspace consolidation. IMPRESSION: 1. Low lung volumes and pulmonary vascular congestion. 2. Support apparatus as above. Electronically Signed   By: Signa Kell M.D.   On: 07/25/2023 09:10   ECHOCARDIOGRAM COMPLETE Result Date: 07/24/2023    ECHOCARDIOGRAM REPORT   Patient Name:   Adam Ferguson Date of Exam: 07/24/2023 Medical Rec #:  [id number]     Height:       72.0 in Accession #:    [id number]    Weight:       350.0 lb Date of Birth:  [birth year]     BSA:          2.703 m Patient Age:    77 years      BP:           111/68 mmHg Patient Gender: M             HR:           72 bpm. Exam Location:  [facility] Procedure: 2D Echo and Definity (Both Spectral and Color Flow Doppler were            utilized during procedure). Indications:     Dyspnea  History:         Patient has prior history of Echocardiogram examinations.  Sonographer:     Elwin Sleight RDCS Referring Phys:  [id number] KHABIB DGAYLI Diagnosing Phys: Julien Nordmann MD  Sonographer Comments: Technically difficult study due to poor echo windows, echo performed with patient supine and on artificial respirator and patient is obese. IMPRESSIONS  1. Left ventricular ejection fraction, by estimation, is 20 to 25%. Left ventricular ejection fraction by 2D MOD biplane is 20.4 %. The left ventricle has severely decreased function. The left ventricle has no regional wall motion abnormalities. The left ventricular internal cavity size was moderately dilated. Left ventricular diastolic parameters are indeterminate.  2. Right ventricular systolic function is mildly reduced. The right ventricular size is normal.  3. The mitral valve is normal in structure. Mild mitral valve regurgitation. No evidence of mitral stenosis.  4. The aortic valve is normal in structure. Aortic valve regurgitation is not visualized. No aortic stenosis is present. Aortic valve mean gradient measures 7.0 mmHg.  5. The inferior vena cava is normal in size with  greater than 50% respiratory variability, suggesting right atrial pressure of 3 mmHg. FINDINGS  Left Ventricle: Left ventricular ejection fraction, by estimation, is 20 to 25%. Left ventricular ejection fraction by 2D MOD biplane is 20.4 %. The left ventricle has severely decreased function. The left ventricle has no regional wall motion abnormalities. Definity contrast agent was given IV to delineate the left ventricular endocardial borders. Strain was performed and the global longitudinal strain is indeterminate. The left ventricular internal cavity size was moderately dilated. There is no left ventricular hypertrophy. Left ventricular diastolic parameters are indeterminate. Right Ventricle: The right ventricular size is normal. No increase in right ventricular wall thickness. Right ventricular  systolic function is mildly reduced. Left Atrium: Left atrial size was normal in size. Right Atrium: Right atrial size was normal in size. Pericardium: There is no evidence of pericardial effusion. Mitral Valve: The mitral valve is normal in structure. There is mild calcification of the mitral valve leaflet(s). Mild mitral valve regurgitation. No evidence of mitral valve stenosis. Tricuspid Valve: The tricuspid valve is normal in structure. Tricuspid valve regurgitation is not demonstrated. No evidence of tricuspid stenosis. Aortic Valve: The aortic valve is normal in structure. Aortic valve regurgitation is not visualized. No aortic stenosis is present. Aortic valve mean gradient measures 7.0 mmHg. Aortic valve peak gradient measures 12.6 mmHg. Aortic valve area, by VTI measures 1.21 cm. Pulmonic Valve: The pulmonic valve was normal in structure. Pulmonic valve regurgitation is not visualized. No evidence of pulmonic stenosis. Aorta: The aortic root is normal in size and structure. Venous: The inferior vena cava is normal in size with greater than 50% respiratory variability, suggesting right atrial pressure of 3 mmHg.  IAS/Shunts: No atrial level shunt detected by color flow Doppler. Additional Comments: 3D was performed not requiring image post processing on an independent workstation and was indeterminate.  LEFT VENTRICLE PLAX 2D                        Biplane EF (MOD) LVIDd:         5.60 cm         LV Biplane EF:   Left LVIDs:         5.10 cm                          ventricular LV PW:         1.00 cm                          ejection LV IVS:        1.20 cm                          fraction by LVOT diam:     2.20 cm                          2D MOD LV SV:         33                               biplane is LV SV Index:   12                               20.4 %. LVOT Area:     3.80 cm                                Diastology                                LV e' medial:  5.66 cm/s LV Volumes (MOD)               LV e' lateral: 5.11 cm/s LV vol d, MOD    189.5 ml A2C: LV vol d, MOD  177.0 ml A4C: LV vol s, MOD    145.0 ml A2C: LV vol s, MOD    138.0 ml A4C: LV SV MOD A2C:   44.5 ml LV SV MOD A4C:   177.0 ml LV SV MOD BP:    37.7 ml RIGHT VENTRICLE RV Basal diam:  3.90 cm RV S prime:     8.16 cm/s TAPSE (M-mode): 1.3 cm LEFT ATRIUM              Index        RIGHT ATRIUM           Index LA diam:        4.70 cm  1.74 cm/m   RA Area:     19.70 cm LA Vol (A2C):   104.0 ml 38.48 ml/m  RA Volume:   54.60 ml  20.20 ml/m LA Vol (A4C):   65.4 ml  24.20 ml/m LA Biplane Vol: 85.5 ml  31.64 ml/m  AORTIC VALVE                     PULMONIC VALVE AV Area (Vmax):    1.16 cm      PV Vmax:        0.81 m/s AV Area (Vmean):   1.08 cm      PV Peak grad:   2.6 mmHg AV Area (VTI):     1.21 cm      RVOT Peak grad: 1 mmHg AV Vmax:           177.33 cm/s AV Vmean:          120.500 cm/s AV VTI:            0.274 m AV Peak Grad:      12.6 mmHg AV Mean Grad:      7.0 mmHg LVOT Vmax:         54.00 cm/s LVOT Vmean:        34.100 cm/s LVOT VTI:          0.087 m LVOT/AV VTI ratio: 0.32  AORTA Ao Root diam: 3.50 cm  SHUNTS Systemic VTI:  0.09 m Systemic  Diam: 2.20 cm Julien Nordmann MD Electronically signed by Julien Nordmann MD Signature Date/Time: 07/24/2023/3:54:39 PM    Final    DG Chest 1 View Result Date: 07/24/2023 CLINICAL DATA:  [id number] Nasogastric tube present [id number] Central line insertion site infection, initial encounter [id number] EXAM: CHEST  1 VIEW COMPARISON:  July 24, 2023 FINDINGS: The cardiomediastinal silhouette is unchanged and enlarged in contour.Status post TAVR and median sternotomy. Interval placement of a RIGHT neck CVC. There is a somewhat curved contour or of the CVC with tip terminating over the upper heart contour. The tip is parallel with the TAVR. The enteric tube courses through the chest to the abdomen beyond the field-of-view. ETT tip terminates approximately 5 cm above the carina. Small LEFT pleural effusion. No pneumothorax. Patchy perihilar predominant opacities and diffuse interstitial prominence. LEFT retrocardiac opacity. IMPRESSION: 1. Interval placement of a RIGHT neck CVC. There is a somewhat curved contour of the distal CVC with tip terminating over the upper heart contour. The tip is parallel with the TAVR. This is in an uncertain anatomic location with differential considerations including placement within SVC or intra arterial placement. Recommend correlation with placement images and function. If further imaging is desired, a dedicated CT could help delineate the definitive position. 2. Small LEFT pleural effusion with LEFT retrocardiac opacity, favored to reflect atelectasis.  3. Patchy perihilar predominant opacities and diffuse interstitial prominence, similar in comparison prior and likely reflecting a combination of infection and edema. These results will be called to the ordering clinician or representative by the Radiologist Assistant, and communication documented in the PACS or [employer]. Electronically Signed   By: Meda Klinefelter M.D.   On: 07/24/2023 12:05   CT ABDOMEN PELVIS W  CONTRAST Result Date: 07/24/2023 CLINICAL DATA:  78 year old male altered mental status, difficulty breathing and severe hypoxia. EXAM: CT ABDOMEN AND PELVIS WITH CONTRAST TECHNIQUE: Multidetector CT imaging of the abdomen and pelvis was performed using the standard protocol following bolus administration of intravenous contrast. RADIATION DOSE REDUCTION: This exam was performed according to the departmental dose-optimization program which includes automated exposure control, adjustment of the mA and/or kV according to patient size and/or use of iterative reconstruction technique. CONTRAST:  80mL OMNIPAQUE IOHEXOL 350 MG/ML SOLN COMPARISON:  CTA chest today. FINDINGS: Lower chest: Detailed on CTA chest separately. Hepatobiliary: Small layering gallstones (series 5, image 28). Otherwise negative liver and gallbladder. No bile duct enlargement. Pancreas: Negative. Spleen: Negative. Adrenals/Urinary Tract: Negative adrenal glands and kidneys. Symmetric renal enhancement and contrast excretion. Diminutive ureters. Bladder decompressed by Foley catheter, but diffusely thick-walled. Stomach/Bowel: Redundant large bowel with retained stool mostly in the rectosigmoid colon. Much of the proximal large bowel is decompressed. Normal appendix on coronal image 48. Nondilated small bowel. Enteric tube terminates in the stomach stomach, satisfactory. Mild gastric air in fluid. Decompressed duodenum. No free air, free fluid, mesenteric inflammation. Vascular/Lymphatic: Extensive Aortoiliac calcified atherosclerosis. Major arterial structures in the abdomen and pelvis remain patent. Normal caliber abdominal aorta. Portal venous system is patent. No lymphadenopathy. Reproductive: Urethral catheter in place.  Prostatomegaly. Other: No pelvis free fluid. Musculoskeletal: Lower thoracic Diffuse idiopathic skeletal hyperostosis (DISH). No acute osseous abnormality identified. IMPRESSION: 1. Abnormal Chest CTA today reported separately.  Satisfactory enteric tube placement in the stomach. 2. Cholelithiasis. No acute or inflammatory process in the abdomen. 3. Prostatomegaly. Bladder is decompressed by Foley catheter, but is diffusely thick-walled. Consider chronic bladder outlet obstruction versus cystitis, UTI. 4.  Aortic Atherosclerosis (ICD10-I70.0). Electronically Signed   By: Odessa Fleming M.D.   On: 07/24/2023 08:01   CT Angio Chest Pulmonary Embolism (PE) W or WO Contrast Result Date: 07/24/2023 CLINICAL DATA:  78 year old male altered mental status, difficulty breathing and severe hypoxia. EXAM: CT ANGIOGRAPHY CHEST WITH CONTRAST TECHNIQUE: Multidetector CT imaging of the chest was performed using the standard protocol during bolus administration of intravenous contrast. Multiplanar CT image reconstructions and MIPs were obtained to evaluate the vascular anatomy. RADIATION DOSE REDUCTION: This exam was performed according to the departmental dose-optimization program which includes automated exposure control, adjustment of the mA and/or kV according to patient size and/or use of iterative reconstruction technique. CONTRAST:  80mL OMNIPAQUE IOHEXOL 350 MG/ML SOLN COMPARISON:  Neck CT, CT Abdomen and Pelvis today reported separately. Prior CTA chest 12/31/2021. FINDINGS: Cardiovascular: Good contrast bolus timing in the pulmonary arterial tree. Mild respiratory motion. No pulmonary artery filling defect identified. Previous TAVR, CABG. Calcified aortic atherosclerosis. Little contrast in the aorta. Heart size at the upper limits of normal. No pericardial effusion. Mediastinum/Nodes: No mediastinal mass or lymphadenopathy. Enteric tube courses through the esophagus. Lungs/Pleura: Intubated. Endotracheal tube tip in good position between the clavicles and carina. Below the ETT the bilateral major airways are patent. Bilateral lower lobe consolidation with superimposed small layering pleural effusions. The left pleural effusion however is chronically  loculated with pleural thickening and  enhancement (series 3, image 101) and not significantly changed from 2023. Superimposed bilateral middle lobe and upper lobe additional widespread peribronchial and lung opacity ranging from sub solid to solid. Upper Abdomen: CT Abdomen and Pelvis reported separately. Musculoskeletal: Chronic sternotomy. Thoracic spine degeneration and Diffuse idiopathic skeletal hyperostosis (DISH). No acute osseous abnormality identified. Review of the MIP images confirms the above findings. IMPRESSION: 1. Negative for acute pulmonary embolus. 2. Severe Bilateral Pneumonia, all lobes affected but consolidation worst in both lower lobes. 3. Small layering right pleural effusion, and chronic left fibrothorax related effusion which is stable since 2023. 4. Satisfactory ET tube. CT Abdomen and Pelvis today reported separately. 5.  Aortic Atherosclerosis (ICD10-I70.0).  TAVR, CABG. Electronically Signed   By: Odessa Fleming M.D.   On: 07/24/2023 07:57   CT Soft Tissue Neck Wo Contrast Result Date: 07/24/2023 CLINICAL DATA:  78 year old male altered mental status, difficulty breathing and severe hypoxia. Intubated. EXAM: CT NECK WITHOUT CONTRAST TECHNIQUE: Multidetector CT imaging of the neck was performed following the standard protocol without intravenous contrast. RADIATION DOSE REDUCTION: This exam was performed according to the departmental dose-optimization program which includes automated exposure control, adjustment of the mA and/or kV according to patient size and/or use of iterative reconstruction technique. COMPARISON:  Head CT today. FINDINGS: Pharynx and larynx: Intubated and oral enteric tubes in place and have appropriate visible courses into the airway and esophagus, respectively. Superimposed mild retained fluid in the pharynx. Noncontrast parapharyngeal retropharyngeal spaces appear negative. Salivary glands: Negative noncontrast appearance. Thyroid: Negative. Lymph nodes: No evidence  of cervical lymphadenopathy on this noncontrast exam. Vascular: Gas in the left subclavian and internal jugular veins is likely IV access related. Vascular patency is not evaluated in the absence of IV contrast. Calcified atherosclerosis in the neck and at the skull base. Limited intracranial: Reported separately. Visualized orbits: Not included. Mastoids and visualized paranasal sinuses: Well aerated. Skeleton: Previous sternotomy. No acute osseous abnormality identified. Upper chest: Abnormal, CTA Chest today is reported separately. IMPRESSION: 1. Intubated and oral enteric tubes in place and appropriately course into the airway and esophagus respectively. 2. No other acute or inflammatory process identified in the noncontrast Neck. 3. Chest CTA today reported separately. Electronically Signed   By: Odessa Fleming M.D.   On: 07/24/2023 07:15   CT HEAD WO CONTRAST ( ) Result Date: 07/24/2023 CLINICAL DATA:  78 year old male altered mental status, difficulty breathing and severe hypoxia. EXAM: CT HEAD WITHOUT CONTRAST TECHNIQUE: Contiguous axial images were obtained from the base of the skull through the vertex without intravenous contrast. RADIATION DOSE REDUCTION: This exam was performed according to the departmental dose-optimization program which includes automated exposure control, adjustment of the mA and/or kV according to patient size and/or use of iterative reconstruction technique. COMPARISON:  None Available. FINDINGS: Brain: Cerebral volume is within normal limits for age. No midline shift, ventriculomegaly, mass effect, evidence of mass lesion, intracranial hemorrhage or evidence of cortically based acute infarction. Minimal to mild for age scattered cerebral white matter hypodensity. Hypodensity along the inferior right cerebellum appears to be streak artifact (sagittal image 29). Vascular: Calcified atherosclerosis at the skull base. No suspicious intracranial vascular hyperdensity. Skull: Intact.  No  fracture identified. Sinuses/Orbits: Intubated. Minor sinus mucosal thickening. Tympanic cavities and mastoids are clear. Other: Partially visible endotracheal and oral enteric tubes. Associated fluid in the visible pharynx. No acute orbit or scalp soft tissue finding identified. IMPRESSION: 1. No acute intracranial abnormality or acute traumatic injury identified. 2. Normal for age noncontrast CT  appearance of the brain. 3. Intubated. Electronically Signed   By: Odessa Fleming M.D.   On: 07/24/2023 07:10   DG Chest Portable 1 View Result Date: 07/24/2023 CLINICAL DATA:  Post intubation. EXAM: PORTABLE CHEST 1 VIEW COMPARISON:  CTA chest 12/30/2021 FINDINGS: Electrical pad overlies the lower left chest. Overlying monitor wires. 4:40 a.m. ETT in place with tip 6.8 cm from the carina. NGT extends to the left in the stomach with the tip in the fundus. There is mild cardiomegaly. Perihilar vascular congestion and mild generalized interstitial edema are noted consistent with CHF or fluid overload, with small pleural effusions forming. There are hazy perihilar opacities on the right-greater-than-left which are probably due to ground-glass edema, less likely pneumonitis. No other focal infiltrate is seen. Stable mediastinum with aortic atherosclerosis. Old sternotomy and CABG changes. No new osseous findings. Thoracic spondylosis. IMPRESSION: 1. ETT and NGT in place. 2. Cardiomegaly with perihilar vascular congestion and mild generalized interstitial edema consistent with CHF or fluid overload. 3. Hazy perihilar opacities on the right-greater-than-left are probably due to ground-glass edema, less likely pneumonitis. 4. Small pleural effusions. 5. Aortic atherosclerosis. Electronically Signed   By: Almira Bar M.D.   On: 07/24/2023 05:13    Microbiology: Results for orders placed or performed during the hospital encounter of 07/24/23  Resp panel by RT-PCR (RSV, Flu A&B, Covid) Anterior Nasal Swab     Status: Abnormal    Collection Time: 07/24/23  4:28 AM   Specimen: Anterior Nasal Swab  Result Value Ref Range Status   SARS Coronavirus 2 by RT PCR POSITIVE (A) NEGATIVE Final    Comment: (NOTE) SARS-CoV-2 target nucleic acids are DETECTED.  The SARS-CoV-2 RNA is generally detectable in upper respiratory specimens during the acute phase of infection. Positive results are indicative of the presence of the identified virus, but do not rule out bacterial infection or co-infection with other pathogens not detected by the test. Clinical correlation with patient history and other diagnostic information is necessary to determine patient infection status. The expected result is Negative.  Fact Sheet for Patients: BloggerCourse.com  Fact Sheet for Healthcare Providers: SeriousBroker.it  This test is not yet approved or cleared by the [place] FDA and  has been authorized for detection and/or diagnosis of SARS-CoV-2 by FDA under an Emergency Use Authorization (EUA).  This EUA will remain in effect (meaning this test can be used) for the duration of  the COVID-19 declaration under Section 564(b)(1) of the A ct, 21 U.S.C. section 360bbb-3(b)(1), unless the authorization is terminated or revoked sooner.     Influenza A by PCR NEGATIVE NEGATIVE Final   Influenza B by PCR NEGATIVE NEGATIVE Final    Comment: (NOTE) The Xpert Xpress SARS-CoV-2/FLU/RSV plus assay is intended as an aid in the diagnosis of influenza from Nasopharyngeal swab specimens and should not be used as a sole basis for treatment. Nasal washings and aspirates are unacceptable for Xpert Xpress SARS-CoV-2/FLU/RSV testing.  Fact Sheet for Patients: BloggerCourse.com  Fact Sheet for Healthcare Providers: SeriousBroker.it  This test is not yet approved or cleared by the [place] FDA and has been authorized for detection and/or  diagnosis of SARS-CoV-2 by FDA under an Emergency Use Authorization (EUA). This EUA will remain in effect (meaning this test can be used) for the duration of the COVID-19 declaration under Section 564(b)(1) of the Act, 21 U.S.C. section 360bbb-3(b)(1), unless the authorization is terminated or revoked.     Resp Syncytial Virus by PCR NEGATIVE NEGATIVE Final  Comment: (NOTE) Fact Sheet for Patients: BloggerCourse.com  Fact Sheet for Healthcare Providers: SeriousBroker.it  This test is not yet approved or cleared by the [place] FDA and has been authorized for detection and/or diagnosis of SARS-CoV-2 by FDA under an Emergency Use Authorization (EUA). This EUA will remain in effect (meaning this test can be used) for the duration of the COVID-19 declaration under Section 564(b)(1) of the Act, 21 U.S.C. section 360bbb-3(b)(1), unless the authorization is terminated or revoked.  Performed at [facility], [address] [place], [place] [address]   Blood culture (routine x 2)     Status: None   Collection Time: 07/24/23  4:34 AM   Specimen: BLOOD LEFT ARM  Result Value Ref Range Status   Specimen Description BLOOD LEFT ARM  Final   Special Requests   Final    BOTTLES DRAWN AEROBIC AND ANAEROBIC Blood Culture results may not be optimal due to an inadequate volume of blood received in culture bottles   Culture   Final    NO GROWTH 5 DAYS Performed at [facility], [address] [place], [place] [address]    Report Status 07/29/2023 FINAL  Final  Blood culture (routine x 2)     Status: None   Collection Time: 07/24/23  4:34 AM   Specimen: BLOOD RIGHT ARM  Result Value Ref Range Status   Specimen Description BLOOD RIGHT ARM  Final   Special Requests   Final    BOTTLES DRAWN AEROBIC AND ANAEROBIC Blood Culture results may not be optimal due to an inadequate volume of blood received in culture bottles    Culture   Final    NO GROWTH 5 DAYS Performed at [facility], [address] [place], [place] [address]    Report Status 07/29/2023 FINAL  Final  Culture, Respiratory w Gram Stain (tracheal aspirate)     Status: None   Collection Time: 07/24/23  3:12 PM   Specimen: Tracheal Aspirate; Respiratory  Result Value Ref Range Status   Specimen Description   Final    TRACHEAL ASPIRATE Performed at [facility], [address], [place], [place] [address]    Special Requests   Final    NONE Performed at [facility], [address] [place], [place] [address]    Gram Stain   Final    FEW WBC PRESENT, PREDOMINANTLY PMN FEW GRAM POSITIVE COCCI    Culture   Final    RARE Normal respiratory flora-no Staph aureus or Pseudomonas seen Performed at [facility], [address] [place], [place] [address]    Report Status 07/26/2023 FINAL  Final  MRSA Next Gen by PCR, Nasal     Status: Abnormal   Collection Time: 07/24/23  3:12 PM   Specimen: Nasal Mucosa; Nasal Swab  Result Value Ref Range Status   MRSA by PCR Next Gen DETECTED (A) NOT DETECTED Final    Comment: RESULT CALLED TO, READ BACK BY AND VERIFIED WITH: KATHERINE CLAYTON @1650  07/24/23 MJU (NOTE) The GeneXpert MRSA Assay (FDA approved for NASAL specimens only), is one component of a comprehensive MRSA colonization surveillance program. It is not intended to diagnose MRSA infection nor to guide or monitor treatment for MRSA infections. Test performance is not FDA approved in patients less than 5 years old. Performed at [facility], [address] [place], [place] [address]     Labs: CBC: Recent Labs  Lab 07/26/23 [id number] 07/27/23 0554 07/28/23 [phone]  WBC 11.0* 10.0 8.4  HGB 14.4 16.1 14.2  HCT 43.2 47.6 42.9  MCV 96.0 95.8 96.2  PLT 150 159 157   Basic Metabolic Panel: Recent Labs  Lab 07/25/23 1643 07/26/23 0414 07/27/23 0554 07/28/23 0414 07/30/23 0429  NA  --  138  140 137 134*  K  --  4.4 4.2 3.9 3.6  CL  --  108 105 103 102  CO2  --  21* 23 25 23   GLUCOSE  --  157* 166* 179* 187*  BUN  --  29* 37* 34* 32*  CREATININE  --  1.22 1.30* 1.04 0.85  CALCIUM  --  8.0* 7.9* 7.8* 8.0*  MG 2.3 2.5* 2.6*  --   --   PHOS 2.8 2.7 2.5  --   --    Liver Function Tests: No results for input(s): "AST", "ALT", "ALKPHOS", "BILITOT", "PROT", "ALBUMIN" in the last 168 hours. CBG: Recent Labs  Lab 07/31/23 1152 07/31/23 1649 07/31/23 2226 08/01/23 0804 08/01/23 1208  GLUCAP 181* 224* 258* 191* 226*    Discharge time spent: greater than 30 minutes.  Signed: Alford Highland, MD [facility] 08/01/2023

## 2023-08-01 NOTE — Discharge Instructions (Signed)
 Consider sleep study as outpatient

## 2023-08-01 NOTE — Progress Notes (Signed)
   08/01/23 1030  Spiritual Encounters  Type of Visit Follow up  Care provided to: Pt not available (Other Clinicians visiting; Will return later to check w/Pt on AD status.)

## 2023-08-01 NOTE — Plan of Care (Signed)
  Problem: Coping: Goal: Psychosocial and spiritual needs will be supported Outcome: Adequate for Discharge   Problem: Education: Goal: Knowledge of disease or condition will improve Outcome: Progressing

## 2023-08-01 NOTE — Assessment & Plan Note (Signed)
 Continue Jardiance and glargine insulin 10 units subcutaneous injection twice daily.
